# Patient Record
Sex: Female | Born: 1985 | Race: Asian | Hispanic: No | Marital: Married | Smoking: Never smoker
Health system: Southern US, Community
[De-identification: ages and names within clinical notes are randomized; demographics above are authoritative.]

## PROBLEM LIST (undated history)

## (undated) DIAGNOSIS — A150 Tuberculosis of lung: Secondary | ICD-10-CM

## (undated) DIAGNOSIS — Z141 Cystic fibrosis carrier: Secondary | ICD-10-CM

## (undated) HISTORY — DX: Cystic fibrosis carrier: Z14.1

## (undated) HISTORY — DX: Tuberculosis of lung: A15.0

## (undated) NOTE — *Deleted (*Deleted)
Inpatient Rehabilitation Care Coordinator  Discharge Note  The overall goal for the admission was met for:   Discharge location: Yes-HOME WITH HUSBAND AND TWO CHILDREN 9 & 5 MONTHS  Length of Stay: Yes-17 DAYS  Discharge activity level: Yes-SUPERVISION-MOD/I LEVEL  Home/community participation: Yes  Services provided included: MD, RD, PT, OT, RN, CM, Pharmacy, Neuropsych and SW  Financial Services: Medicaid  Follow-up services arranged: DME: [facility], 3 IN1 AND [facility] and Patient/Family has no preference for HH/DME agencies  Comments (or additional information):HUSBAND WAS HERE DAILY AND WAS EDUCATED ON BOWEL AND BLADDER PROGRAMS. AWARE HOME HEALTH UNABLE TO GET DUE TO MEDICAID. PLAN TO EVENTUALLY MOVE TO [place] WHERE FAMILY RESIDES  Patient/Family verbalized understanding of follow-up arrangements: Yes  Individual responsible for coordination of the follow-up plan: [facility] [phone]  Confirmed correct DME delivered: Lucy Chris 12/18/2019    Dupree, Lemar Livings

---

## 2010-01-25 NOTE — L&D Delivery Note (Signed)
Operative Delivery Note At 8:34 PM a viable and healthy female was delivered via Vaginal, Vacuum (Kiwi and Office manager).  Presentation: vertex; Position: Occiput,, Anterior; Station: +2/3  Verbal consent: obtained from patient.  Risks and benefits discussed in detail.  Risks include, but are not limited to the risks of anesthesia, bleeding, infection, damage to maternal tissues, fetal cephalhematoma.  There is also the risk of inability to effect vaginal delivery of the head, or shoulder dystocia that cannot be resolved by established maneuvers, leading to the need for emergency cesarean section.  APGAR: 8, 9; weight 6 lb 3.8 oz (2830 g).   Placenta status: Intact, Spontaneous.   Cord: 3 vessels with the following complications: None.    Anesthesia: Epidural  Instruments: Kiwi and Mityvac Episiotomy: None Lacerations: 2nd degree Suture Repair: 3.0 vicryl rapide Est. Blood Loss (mL): 400  Mom to postpartum.  Baby to nursery-stable.  A+, Bo  BOVARD,JODY 09/24/2010, 9:03 PM

## 2010-04-18 ENCOUNTER — Emergency Department (HOSPITAL_COMMUNITY)
Admission: EM | Admit: 2010-04-18 | Discharge: 2010-04-19 | Disposition: A | Discharge status: Home / Self Care | Payer: Medicaid Other | Attending: Emergency Medicine | Admitting: Emergency Medicine

## 2010-04-18 ENCOUNTER — Emergency Department (HOSPITAL_COMMUNITY): Payer: Medicaid Other

## 2010-04-18 DIAGNOSIS — J3489 Other specified disorders of nose and nasal sinuses: Secondary | ICD-10-CM | POA: Insufficient documentation

## 2010-04-18 DIAGNOSIS — R070 Pain in throat: Secondary | ICD-10-CM | POA: Insufficient documentation

## 2010-04-18 DIAGNOSIS — B9789 Other viral agents as the cause of diseases classified elsewhere: Secondary | ICD-10-CM | POA: Insufficient documentation

## 2010-04-18 DIAGNOSIS — R05 Cough: Secondary | ICD-10-CM | POA: Insufficient documentation

## 2010-04-18 DIAGNOSIS — R059 Cough, unspecified: Secondary | ICD-10-CM | POA: Insufficient documentation

## 2010-04-18 DIAGNOSIS — O9989 Other specified diseases and conditions complicating pregnancy, childbirth and the puerperium: Secondary | ICD-10-CM | POA: Insufficient documentation

## 2010-04-18 DIAGNOSIS — J40 Bronchitis, not specified as acute or chronic: Secondary | ICD-10-CM | POA: Insufficient documentation

## 2010-04-23 LAB — ANTIBODY SCREEN: Antibody Screen: NEGATIVE

## 2010-04-23 LAB — RPR: RPR: NONREACTIVE

## 2010-04-23 LAB — HIV ANTIBODY (ROUTINE TESTING W REFLEX)
HIV: NONREACTIVE
HIV: NONREACTIVE

## 2010-04-23 LAB — GC/CHLAMYDIA PROBE AMP, GENITAL
Chlamydia: NEGATIVE
Chlamydia: NEGATIVE
Gonorrhea: NEGATIVE
Gonorrhea: NEGATIVE

## 2010-09-21 ENCOUNTER — Other Ambulatory Visit: Payer: Self-pay | Admitting: Obstetrics and Gynecology

## 2010-09-23 ENCOUNTER — Encounter (HOSPITAL_COMMUNITY): Payer: Self-pay | Admitting: *Deleted

## 2010-09-23 ENCOUNTER — Encounter (HOSPITAL_COMMUNITY): Payer: Self-pay

## 2010-09-23 ENCOUNTER — Other Ambulatory Visit: Payer: Self-pay | Admitting: Obstetrics and Gynecology

## 2010-09-23 ENCOUNTER — Inpatient Hospital Stay (HOSPITAL_COMMUNITY): Admission: RE | Admit: 2010-09-23 | Payer: Self-pay | Source: Ambulatory Visit

## 2010-09-23 ENCOUNTER — Inpatient Hospital Stay (HOSPITAL_COMMUNITY)
Admission: RE | Admit: 2010-09-23 | Discharge: 2010-09-26 | DRG: 775 | Disposition: A | Discharge status: Home / Self Care | Payer: Medicaid Other | Source: Ambulatory Visit | Attending: Obstetrics and Gynecology | Admitting: Obstetrics and Gynecology

## 2010-09-23 ENCOUNTER — Telehealth (HOSPITAL_COMMUNITY): Payer: Self-pay | Admitting: *Deleted

## 2010-09-23 DIAGNOSIS — O480 Post-term pregnancy: Principal | ICD-10-CM | POA: Diagnosis present

## 2010-09-23 DIAGNOSIS — Z3490 Encounter for supervision of normal pregnancy, unspecified, unspecified trimester: Secondary | ICD-10-CM

## 2010-09-23 LAB — CBC
HCT: 37.7 % (ref 36.0–46.0)
Hemoglobin: 12.4 g/dL (ref 12.0–15.0)
MCV: 85.3 fL (ref 78.0–100.0)
Platelets: 122 10*3/uL — ABNORMAL LOW (ref 150–400)
RBC: 4.42 MIL/uL (ref 3.87–5.11)
WBC: 8.6 10*3/uL (ref 4.0–10.5)

## 2010-09-23 MED ORDER — LACTATED RINGERS IV SOLN
INTRAVENOUS | Status: DC
  Administered 2010-09-24: 12:00:00 via INTRAVENOUS
  Administered 2010-09-24: 1000 mL via INTRAVENOUS

## 2010-09-23 MED ORDER — OXYTOCIN BOLUS FROM INFUSION
500.0000 mL | Freq: Once | INTRAVENOUS | Status: DC
  Filled 2010-09-23: qty 500

## 2010-09-23 MED ORDER — TERBUTALINE SULFATE 1 MG/ML IJ SOLN: 0.2500 mg | Freq: Once | INTRAMUSCULAR | Status: AC | PRN

## 2010-09-23 MED ORDER — ACETAMINOPHEN 325 MG PO TABS: 650.0000 mg | ORAL_TABLET | ORAL | Status: DC | PRN

## 2010-09-23 MED ORDER — DINOPROSTONE 10 MG VA INST
10.0000 mg | VAGINAL_INSERT | Freq: Once | VAGINAL | Status: AC
  Administered 2010-09-23: 10 mg via VAGINAL
  Filled 2010-09-23: qty 1

## 2010-09-23 MED ORDER — ONDANSETRON HCL 4 MG/2ML IJ SOLN: 4.0000 mg | Freq: Four times a day (QID) | INTRAMUSCULAR | Status: DC | PRN

## 2010-09-23 MED ORDER — OXYTOCIN 20 UNITS IN LACTATED RINGERS INFUSION - SIMPLE
1.0000 m[IU]/min | INTRAVENOUS | Status: DC
  Administered 2010-09-24: 333 m[IU]/min via INTRAVENOUS
  Filled 2010-09-23: qty 1000

## 2010-09-23 MED ORDER — SODIUM CHLORIDE 0.9 % IJ SOLN
3.0000 mL | INTRAMUSCULAR | Status: DC | PRN
  Administered 2010-09-23 – 2010-09-24 (×2): 3 mL via INTRAVENOUS

## 2010-09-23 MED ORDER — LIDOCAINE HCL (PF) 1 % IJ SOLN
30.0000 mL | INTRAMUSCULAR | Status: DC | PRN
  Administered 2010-09-24: 30 mL via SUBCUTANEOUS
  Filled 2010-09-23: qty 30

## 2010-09-23 MED ORDER — LACTATED RINGERS IV SOLN
500.0000 mL | INTRAVENOUS | Status: DC | PRN
  Administered 2010-09-24: 500 mL via INTRAVENOUS

## 2010-09-23 MED ORDER — BUTORPHANOL TARTRATE 2 MG/ML IJ SOLN
1.0000 mg | INTRAMUSCULAR | Status: DC | PRN
  Administered 2010-09-24 (×2): 1 mg via INTRAVENOUS
  Filled 2010-09-23 (×2): qty 1

## 2010-09-23 MED ORDER — FLEET ENEMA 7-19 GM/118ML RE ENEM: 1.0000 | ENEMA | RECTAL | Status: DC | PRN

## 2010-09-23 MED ORDER — CITRIC ACID-SODIUM CITRATE 334-500 MG/5ML PO SOLN: 30.0000 mL | ORAL | Status: DC | PRN

## 2010-09-23 MED ORDER — ZOLPIDEM TARTRATE 10 MG PO TABS: 10.0000 mg | ORAL_TABLET | Freq: Every evening | ORAL | Status: DC | PRN

## 2010-09-23 MED ORDER — IBUPROFEN 600 MG PO TABS: 600.0000 mg | ORAL_TABLET | Freq: Four times a day (QID) | ORAL | Status: DC | PRN

## 2010-09-23 MED ORDER — OXYTOCIN 20 UNITS IN LACTATED RINGERS INFUSION - SIMPLE: 125.0000 mL/h | INTRAVENOUS | Status: DC

## 2010-09-23 MED ORDER — OXYCODONE-ACETAMINOPHEN 5-325 MG PO TABS: 2.0000 | ORAL_TABLET | ORAL | Status: DC | PRN

## 2010-09-23 NOTE — H&P (Signed)
Melanie Baldwin is an 25 y.o. female. G1P0 at 41+ for iol given term status and unfavorable cervix.  +FM, no LOF, sm VB, occ ctx; uncomplicated [facility]  Pertinent Gynecological History: No abn pap, no STD OB History: G1, P0   Menstrual History:  No LMP recorded. Patient is pregnant. EDC 09/16/10, nl anat, ant plac, female   Past Medical History  Diagnosis Date  . TB (pulmonary tuberculosis)     took med for 6 months currently negative  . Cystic fibrosis carrier     No past surgical history on file.  Family History  Problem Relation Age of Onset  . Diabetes Paternal Grandmother     Social History:  does not have a smoking history on file. She does not have any smokeless tobacco history on file. Her alcohol and drug histories not on file.  Allergies: Allergies no known allergies   (Not in a hospital admission)  Review of Systems  Constitutional: Negative.   HENT: Negative.   Eyes: Negative.   Respiratory: Negative.   Cardiovascular: Negative.   Gastrointestinal: Negative.   Genitourinary: Negative.   Musculoskeletal: Negative.   Skin: Negative.   Neurological: Negative.   Endo/Heme/Allergies: Negative.   Psychiatric/Behavioral: Negative.     There were no vitals taken for this visit. Physical Exam  Constitutional: She is oriented to person, place, and time. She appears well-developed and well-nourished.  HENT:  Head: Normocephalic and atraumatic.  Neck: Normal range of motion. Neck supple. No thyromegaly present.  Cardiovascular: Normal rate and regular rhythm.   Respiratory: Effort normal and breath sounds normal.  GI: Soft. Bowel sounds are normal.       FNT  Musculoskeletal: Normal range of motion.  Neurological: She is alert and oriented to person, place, and time.  Skin: Skin is warm and dry.  Psychiatric: She has a normal mood and affect.    PNL Rubella Immune, RPR NR, HepBsag neg, HIV neg, GC neg, Chl neg, Varicella Immune, CF carrier, gbbs neg, AFP WNL,  glucola 81, A+, Ab Scr neg, Hgb 11.5, Pap wnl, no ECC, Rub  Assessment/Plan: 25yo G1P0 at 41+ for iol gbbs neg, no prophylaxis iol with cervidil, AROM, pitocin Epidural/ IV pain med prn Expect SVD - reviewed with pt long iol  BOVARD,JODY 09/23/2010, 4:31 PM

## 2010-09-23 NOTE — Telephone Encounter (Signed)
Preadmission screen  

## 2010-09-24 ENCOUNTER — Encounter (HOSPITAL_COMMUNITY): Payer: Self-pay | Admitting: Anesthesiology

## 2010-09-24 ENCOUNTER — Inpatient Hospital Stay (HOSPITAL_COMMUNITY): Payer: Medicaid Other | Admitting: Anesthesiology

## 2010-09-24 ENCOUNTER — Encounter (HOSPITAL_COMMUNITY): Payer: Self-pay

## 2010-09-24 DIAGNOSIS — Z3490 Encounter for supervision of normal pregnancy, unspecified, unspecified trimester: Secondary | ICD-10-CM

## 2010-09-24 LAB — CBC
HCT: 36.5 % (ref 36.0–46.0)
Hemoglobin: 11.7 g/dL — ABNORMAL LOW (ref 12.0–15.0)
RDW: 14.9 % (ref 11.5–15.5)
WBC: 16.4 10*3/uL — ABNORMAL HIGH (ref 4.0–10.5)

## 2010-09-24 MED ORDER — EPHEDRINE 5 MG/ML INJ
10.0000 mg | INTRAVENOUS | Status: DC | PRN
  Filled 2010-09-24: qty 4

## 2010-09-24 MED ORDER — TERBUTALINE SULFATE 1 MG/ML IJ SOLN: 0.2500 mg | Freq: Once | INTRAMUSCULAR | Status: DC | PRN

## 2010-09-24 MED ORDER — PRENATAL PLUS 27-1 MG PO TABS
1.0000 | ORAL_TABLET | Freq: Every day | ORAL | Status: DC
  Administered 2010-09-26: 1 via ORAL

## 2010-09-24 MED ORDER — OXYTOCIN 20 UNITS IN LACTATED RINGERS INFUSION - SIMPLE
1.0000 m[IU]/min | INTRAVENOUS | Status: DC
  Administered 2010-09-24: 2 m[IU]/min via INTRAVENOUS
  Filled 2010-09-24: qty 1000

## 2010-09-24 MED ORDER — FENTANYL 2.5 MCG/ML BUPIVACAINE 1/10 % EPIDURAL INFUSION (WH - ANES)
INTRAMUSCULAR | Status: AC
  Filled 2010-09-24: qty 60

## 2010-09-24 MED ORDER — WITCH HAZEL-GLYCERIN EX PADS: 1.0000 "application " | MEDICATED_PAD | CUTANEOUS | Status: DC | PRN

## 2010-09-24 MED ORDER — SODIUM CHLORIDE 0.9 % IV SOLN: 250.0000 mL | INTRAVENOUS | Status: DC

## 2010-09-24 MED ORDER — BENZOCAINE-MENTHOL 20-0.5 % EX AERO: 1.0000 "application " | INHALATION_SPRAY | CUTANEOUS | Status: DC | PRN

## 2010-09-24 MED ORDER — PHENYLEPHRINE 40 MCG/ML (10ML) SYRINGE FOR IV PUSH (FOR BLOOD PRESSURE SUPPORT)
80.0000 ug | PREFILLED_SYRINGE | INTRAVENOUS | Status: DC | PRN
  Filled 2010-09-24: qty 5

## 2010-09-24 MED ORDER — TETANUS-DIPHTH-ACELL PERTUSSIS 5-2.5-18.5 LF-MCG/0.5 IM SUSP
0.5000 mL | Freq: Once | INTRAMUSCULAR | Status: AC
  Administered 2010-09-25: 0.5 mL via INTRAMUSCULAR

## 2010-09-24 MED ORDER — SIMETHICONE 80 MG PO CHEW: 80.0000 mg | CHEWABLE_TABLET | ORAL | Status: DC | PRN

## 2010-09-24 MED ORDER — SODIUM CHLORIDE 0.9 % IJ SOLN: 3.0000 mL | INTRAMUSCULAR | Status: DC | PRN

## 2010-09-24 MED ORDER — ONDANSETRON HCL 4 MG/2ML IJ SOLN: 4.0000 mg | INTRAMUSCULAR | Status: DC | PRN

## 2010-09-24 MED ORDER — OXYTOCIN 20 UNITS IN LACTATED RINGERS INFUSION - SIMPLE: 125.0000 mL/h | INTRAVENOUS | Status: DC | PRN

## 2010-09-24 MED ORDER — EPHEDRINE 5 MG/ML INJ
INTRAVENOUS | Status: AC
  Filled 2010-09-24: qty 4

## 2010-09-24 MED ORDER — OXYCODONE-ACETAMINOPHEN 5-325 MG PO TABS: 1.0000 | ORAL_TABLET | ORAL | Status: DC | PRN

## 2010-09-24 MED ORDER — IBUPROFEN 600 MG PO TABS
600.0000 mg | ORAL_TABLET | Freq: Four times a day (QID) | ORAL | Status: DC
  Administered 2010-09-25 – 2010-09-26 (×7): 600 mg via ORAL
  Filled 2010-09-24 (×7): qty 1

## 2010-09-24 MED ORDER — PHENYLEPHRINE 40 MCG/ML (10ML) SYRINGE FOR IV PUSH (FOR BLOOD PRESSURE SUPPORT)
PREFILLED_SYRINGE | INTRAVENOUS | Status: AC
  Filled 2010-09-24: qty 5

## 2010-09-24 MED ORDER — LACTATED RINGERS IV SOLN: INTRAVENOUS | Status: DC

## 2010-09-24 MED ORDER — PRENATAL PLUS 27-1 MG PO TABS
1.0000 | ORAL_TABLET | Freq: Every day | ORAL | Status: DC
  Administered 2010-09-25 – 2010-09-26 (×2): 1 via ORAL
  Filled 2010-09-24 (×2): qty 1

## 2010-09-24 MED ORDER — ONDANSETRON HCL 4 MG PO TABS: 4.0000 mg | ORAL_TABLET | ORAL | Status: DC | PRN

## 2010-09-24 MED ORDER — DIPHENHYDRAMINE HCL 50 MG/ML IJ SOLN: 12.5000 mg | INTRAMUSCULAR | Status: DC | PRN

## 2010-09-24 MED ORDER — LANOLIN HYDROUS EX OINT: TOPICAL_OINTMENT | CUTANEOUS | Status: DC | PRN

## 2010-09-24 MED ORDER — ZOLPIDEM TARTRATE 5 MG PO TABS: 5.0000 mg | ORAL_TABLET | Freq: Every evening | ORAL | Status: DC | PRN

## 2010-09-24 MED ORDER — DIPHENHYDRAMINE HCL 25 MG PO CAPS: 25.0000 mg | ORAL_CAPSULE | Freq: Four times a day (QID) | ORAL | Status: DC | PRN

## 2010-09-24 MED ORDER — SODIUM CHLORIDE 0.9 % IJ SOLN
3.0000 mL | Freq: Two times a day (BID) | INTRAMUSCULAR | Status: DC
  Administered 2010-09-25: 3 mL via INTRAVENOUS

## 2010-09-24 MED ORDER — SENNOSIDES-DOCUSATE SODIUM 8.6-50 MG PO TABS
2.0000 | ORAL_TABLET | Freq: Every day | ORAL | Status: DC
  Administered 2010-09-25: 2 via ORAL

## 2010-09-24 MED ORDER — FENTANYL 2.5 MCG/ML BUPIVACAINE 1/10 % EPIDURAL INFUSION (WH - ANES)
14.0000 mL/h | INTRAMUSCULAR | Status: DC
  Administered 2010-09-24 (×2): 14 mL/h via EPIDURAL
  Administered 2010-09-24: 12 mL/h via EPIDURAL
  Filled 2010-09-24 (×2): qty 60

## 2010-09-24 MED ORDER — LACTATED RINGERS IV SOLN: 500.0000 mL | Freq: Once | INTRAVENOUS | Status: DC

## 2010-09-24 MED ORDER — DIBUCAINE 1 % RE OINT: 1.0000 "application " | TOPICAL_OINTMENT | RECTAL | Status: DC | PRN

## 2010-09-24 NOTE — Progress Notes (Signed)
Pt denies pain medication at this time

## 2010-09-24 NOTE — Anesthesia Procedure Notes (Signed)
Epidural Patient location during procedure: OB Start time: 09/24/2010 9:55 AM  Staffing Anesthesiologist: Jiles Garter  Preanesthetic Checklist Completed: patient identified, site marked, surgical consent, pre-op evaluation, timeout performed, IV checked, risks and benefits discussed and monitors and equipment checked  Epidural Patient position: sitting Prep: site prepped and draped and DuraPrep Patient monitoring: continuous pulse ox and blood pressure Approach: midline Injection technique: LOR air  Needle:  Needle type: Tuohy  Needle gauge: 17 G Needle length: 9 cm Needle insertion depth: 4 cm Catheter type: closed end flexible Catheter size: 19 Gauge Catheter at skin depth: 10 cm Test dose: negative  Assessment Events: blood not aspirated, injection not painful, no injection resistance, negative IV test and no paresthesia  Additional Notes Dosing of Epidural: 1st dose, Through needle...... 3mg  Marcaine 2nd dose, through catheter.... epi 1:200K + Xylocaine 40 mg 3rd dose, through catheter...Marland KitchenMarland Kitchenepi 1:200K + Xylocaine 30 mg Each dose occurred after waiting 3 min,patient was free of IV sx; and patient exhibits no evidence of SA injection  Patient is more comfortable after epidural dosed. Please see RN's note for documentation of vital signs,and FHR which are stable.

## 2010-09-24 NOTE — Anesthesia Preprocedure Evaluation (Addendum)

## 2010-09-24 NOTE — Progress Notes (Signed)
Melanie Baldwin is a 25 y.o. G1P0000 at [redacted]w[redacted]d  admitted for induction of labor due to Post dates. .  Subjective: No c/o's, uncomf with ctx  Objective: BP 131/94  Pulse 88  Temp(Src) 98.2 F (36.8 C) (Oral)  Resp 20  Ht 5\' 3"  (1.6 m)  Wt 70.308 kg (155 lb)  BMI 27.46 kg/m2     gen NAD FHT:  FHR: 120's bpm, variability: moderate,  decelerations:  Absent UC:   regular, every 2 minutes SVE:   Dilation: 2.3 Effacement (%): 80 Station: -1/0 Exam by:: Carmelina Noun, MD  Labs: Lab Results  Component Value Date   WBC 8.6 09/23/2010   HGB 12.4 09/23/2010   HCT 37.7 09/23/2010   MCV 85.3 09/23/2010   PLT 122* 09/23/2010    Assessment / Plan: Induction of labor due to postterm,  progressing well on pitocin, will start pitocin  Labor: AROM performed, sm clear fluid, cervidil removed Preeclampsia:  no signs or symptoms of toxicity Fetal Wellbeing:  Category II Pain Control:  Epiduralor IV pain meds prn  Anticipated MOD:  NSVD  BOVARD,JODY 09/24/2010, 8:05 AM

## 2010-09-24 NOTE — Progress Notes (Signed)
Yuko Coventry is a 25 y.o. G1P0000 at [redacted]w[redacted]d admitted for induction of labor due to Post dates.    Subjective: comf with epidural  Objective: BP 122/81  Pulse 90  Temp(Src) 97.9 F (36.6 C) (Oral)  Resp 20  Ht 5\' 3"  (1.6 m)  Wt 70.308 kg (155 lb)  BMI 27.46 kg/m2  SpO2 99%     gen NAD FHT:  FHR: 130's bpm, variability: moderate,  accelerations:  Present,  decelerations:  Absent UC:   irregular, every 5-7 minutes SVE:   Dilation: 7 Effacement (%): 90 Station: 0/+1 Exam by:: Carmelina Noun, MD  Labs: Lab Results  Component Value Date   WBC 8.6 09/23/2010   HGB 12.4 09/23/2010   HCT 37.7 09/23/2010   MCV 85.3 09/23/2010   PLT 122* 09/23/2010    Assessment / Plan: Induction of labor due to postterm.  Labor: pitocin to augment labor Preeclampsia:  no signs or symptoms of toxicity Fetal Wellbeing:  Category II Pain Control:  Epidural Anticipated MOD:  NSVD  BOVARD,JODY 09/24/2010, 4:34 PM

## 2010-09-24 NOTE — Progress Notes (Signed)
Melanie Baldwin is a 25 y.o. G1P0000 at [redacted]w[redacted]d  admitted for induction of labor due to Post dates.   Subjective: comf with epidural  Objective: BP 106/62  Pulse 81  Temp(Src) 97.6 F (36.4 C) (Oral)  Resp 20  Ht 5\' 3"  (1.6 m)  Wt 70.308 kg (155 lb)  BMI 27.46 kg/m2  SpO2 99%     gen NAD FHT:  FHR: 130's bpm, variability: moderate,  accelerations:  Present,  decelerations:  Absent UC:   regular, every 5 minutes SVE:   Dilation: 4.5 Effacement (%): 80 Station: -1/0 Exam by:: Carmelina Noun, MD  Labs: Lab Results  Component Value Date   WBC 8.6 09/23/2010   HGB 12.4 09/23/2010   HCT 37.7 09/23/2010   MCV 85.3 09/23/2010   PLT 122* 09/23/2010    Assessment / Plan: Induction of labor due to postterm,  progressing well on pitocin  Labor: Progressing normally Preeclampsia:  no signs or symptoms of toxicity Fetal Wellbeing:  Category II Pain Control:  Epidural  Anticipated MOD:  NSVD  BOVARD,Manessa Buley 09/24/2010, 12:27 PM

## 2010-09-25 ENCOUNTER — Encounter (HOSPITAL_COMMUNITY): Payer: Self-pay

## 2010-09-25 LAB — CBC
Hemoglobin: 11.1 g/dL — ABNORMAL LOW (ref 12.0–15.0)
MCH: 27.9 pg (ref 26.0–34.0)
RBC: 3.98 MIL/uL (ref 3.87–5.11)
WBC: 14.9 10*3/uL — ABNORMAL HIGH (ref 4.0–10.5)

## 2010-09-25 MED ORDER — BENZOCAINE-MENTHOL 20-0.5 % EX AERO
INHALATION_SPRAY | CUTANEOUS | Status: AC
  Filled 2010-09-25: qty 56

## 2010-09-25 NOTE — Anesthesia Postprocedure Evaluation (Signed)
  Anesthesia Post-op Note  Patient: Melanie Baldwin  Procedure(s) Performed: * No procedures listed *  Patient Location: Mother/Baby  Anesthesia Type: Epidural  Level of Consciousness: awake, alert  and oriented  Airway and Oxygen Therapy: Patient Spontanous Breathing   Post-op Assessment: Post-op Vital signs reviewed, Patient's Cardiovascular Status Stable, No headache, No backache, No residual numbness and No residual motor weakness  Post-op Vital Signs: Reviewed and stable  Complications: No apparent anesthesia complications

## 2010-09-25 NOTE — Progress Notes (Signed)
Post Partum Day 1 Subjective: no complaints, tolerating PO and pain controlled, nl lochia  Objective: Blood pressure 104/70, pulse 108, temperature 98.1 F (36.7 C), temperature source Oral, resp. rate 20, height 5\' 3"  (1.6 m), weight 70.308 kg (155 lb), last menstrual period 01/04/2010, SpO2 99.00%, unknown if currently breastfeeding.  Physical Exam:  General: alert and no distress Lochia: appropriate Uterine Fundus: firm  DVT Evaluation: No evidence of DVT seen on physical exam.   Basename 09/25/10 0545 09/24/10 2245  HGB 11.1* 11.7*  HCT 34.2* 36.5    Assessment/Plan: Plan for discharge tomorrow, bottlefeeding.  Doing well   LOS: 2 days   Baldwin,Melanie Vanderveer 09/25/2010, 7:59 AM

## 2010-09-25 NOTE — Progress Notes (Signed)
UR chart review completed.  

## 2010-09-26 MED ORDER — OXYCODONE-ACETAMINOPHEN 5-325 MG PO TABS: 1.0000 | ORAL_TABLET | ORAL | Status: AC | PRN

## 2010-09-26 MED ORDER — IBUPROFEN 600 MG PO TABS: 600.0000 mg | ORAL_TABLET | Freq: Four times a day (QID) | ORAL | Status: AC

## 2010-09-26 MED ORDER — SENNOSIDES-DOCUSATE SODIUM 8.6-50 MG PO TABS: 2.0000 | ORAL_TABLET | Freq: Every day | ORAL | Status: AC

## 2010-09-26 NOTE — Progress Notes (Signed)
Post Partum Day1 Subjective: no complaints, voiding and tolerating PO  Objective: Blood pressure 101/70, pulse 89, temperature 97.5 F (36.4 C), temperature source Oral, resp. rate 18, height 5\' 3"  (1.6 m), weight 70.308 kg (155 lb), last menstrual period 01/04/2010, SpO2 99.00%, unknown if currently breastfeeding.  Physical Exam:  General: alert Lochia: appropriate Uterine Fundus: firm  Basename 09/25/10 0545 09/24/10 2245  HGB 11.1* 11.7*  HCT 34.2* 36.5    Assessment/Plan: Discharge home Motrin and percocet Baby being tested for jaundice.   LOS: 3 days   RICHARDSON,KATHY W 09/26/2010, 9:58 AM

## 2010-09-26 NOTE — Discharge Summary (Signed)
Obstetric Discharge Summary Reason for Admission: induction of labor Prenatal Procedures: cervidil ripening Intrapartum Procedures: vacuum Postpartum Procedures: none Complications-Operative and Postpartum: 2nd degree perineal laceration Hemoglobin  Date Value Range Status  09/25/2010 11.1* 12.0-15.0 (g/dL) Final     HCT  Date Value Range Status  09/25/2010 34.2* 36.0-46.0 (%) Final    Discharge Diagnoses: Term Pregnancy-delivered                                         Vacuum assisted Vaginal delivery  Discharge Information: Date: 09/26/2010 Activity: pelvic rest Diet: routine Medications: Ibuprophen and Percocet  Condition: improved Instructions: refer to practice specific booklet Discharge to: home Follow-up Information    Follow up with BOVARD,JODY, MD. Make an appointment in 6 weeks.   Contact information:   [address] [place] [address] [phone]          Newborn Data: Live born female  Birth Weight: 6 lb 3.8 oz (2830 g) APGAR: 8, 9  Home with mother.  Oliver Pila 09/26/2010, 10:04 AM

## 2010-09-27 ENCOUNTER — Encounter (HOSPITAL_COMMUNITY): Payer: Self-pay

## 2010-09-27 ENCOUNTER — Inpatient Hospital Stay (HOSPITAL_COMMUNITY)
Admission: AD | Admit: 2010-09-27 | Discharge: 2010-09-27 | Disposition: A | Discharge status: Home / Self Care | Payer: Medicaid Other | Source: Ambulatory Visit | Attending: Obstetrics and Gynecology | Admitting: Obstetrics and Gynecology

## 2010-09-27 ENCOUNTER — Emergency Department (HOSPITAL_COMMUNITY): Payer: Medicaid Other

## 2010-09-27 ENCOUNTER — Emergency Department (HOSPITAL_COMMUNITY)
Admission: EM | Admit: 2010-09-27 | Discharge: 2010-09-27 | Disposition: A | Discharge status: Other Acute Inpatient Hospital | Payer: Medicaid Other | Source: Home / Self Care | Attending: Emergency Medicine | Admitting: Emergency Medicine

## 2010-09-27 DIAGNOSIS — O26899 Other specified pregnancy related conditions, unspecified trimester: Secondary | ICD-10-CM | POA: Insufficient documentation

## 2010-09-27 DIAGNOSIS — R10819 Abdominal tenderness, unspecified site: Secondary | ICD-10-CM | POA: Insufficient documentation

## 2010-09-27 DIAGNOSIS — R109 Unspecified abdominal pain: Secondary | ICD-10-CM | POA: Insufficient documentation

## 2010-09-27 DIAGNOSIS — N949 Unspecified condition associated with female genital organs and menstrual cycle: Secondary | ICD-10-CM | POA: Insufficient documentation

## 2010-09-27 DIAGNOSIS — N898 Other specified noninflammatory disorders of vagina: Secondary | ICD-10-CM | POA: Insufficient documentation

## 2010-09-27 LAB — CBC
HCT: 30.8 % — ABNORMAL LOW (ref 36.0–46.0)
Hemoglobin: 10.2 g/dL — ABNORMAL LOW (ref 12.0–15.0)
MCH: 27.9 pg (ref 26.0–34.0)
MCHC: 33.1 g/dL (ref 30.0–36.0)
MCV: 84.4 fL (ref 78.0–100.0)
Platelets: 137 K/uL — ABNORMAL LOW (ref 150–400)
RBC: 3.65 MIL/uL — ABNORMAL LOW (ref 3.87–5.11)
RDW: 15 % (ref 11.5–15.5)
WBC: 10.5 K/uL (ref 4.0–10.5)

## 2010-09-27 LAB — DIFFERENTIAL
Eosinophils Absolute: 0.2 10*3/uL (ref 0.0–0.7)
Lymphocytes Relative: 11 % — ABNORMAL LOW (ref 12–46)
Lymphs Abs: 1.1 10*3/uL (ref 0.7–4.0)
Monocytes Relative: 8 % (ref 3–12)
Neutrophils Relative %: 79 % — ABNORMAL HIGH (ref 43–77)

## 2010-09-27 LAB — BASIC METABOLIC PANEL
CO2: 24 mEq/L (ref 19–32)
Calcium: 8.4 mg/dL (ref 8.4–10.5)
GFR calc non Af Amer: >60 mL/min (ref >60)
Sodium: 135 mEq/L (ref 135–145)

## 2010-09-27 LAB — WET PREP, GENITAL
Trich, Wet Prep: NONE SEEN
Yeast Wet Prep HPF POC: NONE SEEN

## 2010-09-27 MED ORDER — AMOXICILLIN-POT CLAVULANATE 875-125 MG PO TABS: 1.0000 | ORAL_TABLET | Freq: Two times a day (BID) | ORAL | Status: AC

## 2010-09-27 NOTE — Progress Notes (Signed)
Patient is transferred from [place] by [facility]. She had svd on 8/30 and was discharged yesterday. Patient states that she was having severe lower abdominal pain and was told to go to [facility]. Patient was given iv antibiotic doxycyline at [place]. She denies any pain,nausea or vomiting. she is alert and oriented.

## 2010-09-27 NOTE — ED Provider Notes (Signed)
Client stable.  Dr. Senaida Ores has been notified and will come to see client.  Nolene Bernheim, NP 09/27/10 0717  Pt transferred from [facility] ED where she mistakenly went with pain and possible fever postpartum.  Pt [facility] and went home.  Never got her pain prescriptions filled and never took any further meds.   About 9pm had an onset of abdominal/pelvic pain and got very hot and sweaty--family thought she had fever, but no thermometer at home.  Baby doing well. Pt is bottle feeding.  Breasts are slightly engorged but not painful.  Pt at [facility] ED since 12am and received  Morphine 6mg  IV at 2am as well as was started on Doxycycline and Flagyl before I was called.  WBC was normal at 10K and pt has been afebrile her entire hospital visit for 8 hours. Also had abdominal US which was c/w her newly pp state. Pt reports bleeding was minimal at home.  PE  Mildly engorged breasts Abdomen soft, uterus approp at umbilicus--slightly tender to palpation Ext clear Pelvic not repeated as was done at [facility]  Pt with pelvic pain and possible fever at home.  D/w her importance of getting pain meds filled and at a minimum using the motrin for uterine crampimng.  Pt did have a long labor course so could have developed a mild endometritis. Has received doxy/flagyl per [place].  Will d/c home on Augmentin 875mg  po BID for 5 days to cover that.  She and translator were given careful instructions to call or return to [facility] for temp greater than 101 or return of pain that is not controlled with po meds.  Will f/u in office for pp visit or prn.

## 2010-09-27 NOTE — Progress Notes (Signed)
Dr Senaida Ores notified of patient arrival. She was aware of patient from [place]. She states that she will come to see patient within an hour. She orders temperature to be rechecked.

## 2013-11-26 ENCOUNTER — Encounter (HOSPITAL_COMMUNITY): Payer: Self-pay

## 2014-06-10 ENCOUNTER — Ambulatory Visit (INDEPENDENT_AMBULATORY_CARE_PROVIDER_SITE_OTHER): Payer: 59 | Admitting: Family Medicine

## 2014-06-10 ENCOUNTER — Encounter: Payer: Self-pay | Admitting: Family Medicine

## 2014-06-10 VITALS — BP 90/68 | HR 84 | Wt 153.2 lb

## 2014-06-10 DIAGNOSIS — M5442 Lumbago with sciatica, left side: Secondary | ICD-10-CM

## 2014-06-10 NOTE — Progress Notes (Signed)
   Subjective:    Patient ID: Melanie Baldwin, female    DOB: 1985/08/29, 29 y.o.   MRN: [id number]  HPI She complains of a one-day history of low back pain. This started when she was brushing her teeth yesterday morning. No numbness, tingling or weakness but it does cause difficulty with walking or with any motion.   Review of Systems     Objective:   Physical Exam Pain on motion of her back. Some tenderness over the left SI joint. Straight leg raising was positive at 45 with positive sciatic stretch. DTRs normal. Hip motion. Faber testing negative.       Assessment & Plan:  Left-sided low back pain with left-sided sciatica Recommend 800 mg ibuprofen 3 times a day as well as using Tylenol. Heat for 20 minutes. Return here in one week for a recheck.

## 2014-06-10 NOTE — Patient Instructions (Signed)
4 Advil 3 times per day and set up for an appointment in a week and that's look again. Heat to that area for 20 minutes . Can also take Tylenol with the Advil

## 2014-06-11 ENCOUNTER — Telehealth: Payer: Self-pay | Admitting: Internal Medicine

## 2014-06-11 MED ORDER — DICLOFENAC SODIUM 75 MG PO TBEC: 75.0000 mg | DELAYED_RELEASE_TABLET | Freq: Two times a day (BID) | ORAL | Status: DC

## 2014-06-11 NOTE — Telephone Encounter (Signed)
Called, Spoke with pt. to let her know that Dr. Susann GivensLalonde called in a new Rx for her today

## 2014-06-11 NOTE — Telephone Encounter (Signed)
Pt called and states that you told her to take tynelol and advil and its not helping. What does she need to do. Her pharmacy is [facility]

## 2014-06-11 NOTE — Telephone Encounter (Signed)
Let her know that I called the medication in for her 

## 2014-06-12 ENCOUNTER — Telehealth: Payer: Self-pay | Admitting: Internal Medicine

## 2014-06-12 MED ORDER — DICLOFENAC SODIUM 75 MG PO TBEC: 75.0000 mg | DELAYED_RELEASE_TABLET | Freq: Two times a day (BID) | ORAL | Status: DC

## 2014-06-12 NOTE — Telephone Encounter (Signed)
Pt called in to answering service to let us know, her med was not at pharmacy. I have called in med to pharmacy

## 2015-01-08 ENCOUNTER — Ambulatory Visit (INDEPENDENT_AMBULATORY_CARE_PROVIDER_SITE_OTHER): Payer: 59 | Admitting: Family Medicine

## 2015-01-08 ENCOUNTER — Encounter: Payer: Self-pay | Admitting: Family Medicine

## 2015-01-08 VITALS — BP 110/70 | HR 64 | Temp 99.8°F | Wt 152.8 lb

## 2015-01-08 DIAGNOSIS — R220 Localized swelling, mass and lump, head: Secondary | ICD-10-CM | POA: Diagnosis not present

## 2015-01-08 DIAGNOSIS — M2669 Other specified disorders of temporomandibular joint: Secondary | ICD-10-CM

## 2015-01-08 LAB — CBC WITH DIFFERENTIAL/PLATELET
BASOS ABS: 0 10*3/uL (ref 0.0–0.1)
BASOS PCT: 0 % (ref 0–1)
Eosinophils Absolute: 0 10*3/uL (ref 0.0–0.7)
Eosinophils Relative: 0 % (ref 0–5)
HEMATOCRIT: 38.7 % (ref 36.0–46.0)
HEMOGLOBIN: 12.4 g/dL (ref 12.0–15.0)
LYMPHS PCT: 12 % (ref 12–46)
Lymphs Abs: 1.5 10*3/uL (ref 0.7–4.0)
MCH: 26.5 pg (ref 26.0–34.0)
MCHC: 32 g/dL (ref 30.0–36.0)
MCV: 82.7 fL (ref 78.0–100.0)
MONO ABS: 0.7 10*3/uL (ref 0.1–1.0)
MPV: 11.9 fL (ref 8.6–12.4)
Monocytes Relative: 6 % (ref 3–12)
NEUTROS ABS: 10.1 10*3/uL — AB (ref 1.7–7.7)
NEUTROS PCT: 82 % — AB (ref 43–77)
Platelets: 190 10*3/uL (ref 150–400)
RBC: 4.68 MIL/uL (ref 3.87–5.11)
RDW: 14.5 % (ref 11.5–15.5)
WBC: 12.3 10*3/uL — AB (ref 4.0–10.5)

## 2015-01-08 MED ORDER — HYDROCODONE-ACETAMINOPHEN 5-325 MG PO TABS: 1.0000 | ORAL_TABLET | Freq: Four times a day (QID) | ORAL | Status: DC | PRN

## 2015-01-08 MED ORDER — AMOXICILLIN-POT CLAVULANATE 875-125 MG PO TABS: 1.0000 | ORAL_TABLET | Freq: Two times a day (BID) | ORAL | Status: DC

## 2015-01-08 MED ORDER — CEFTRIAXONE SODIUM 1 G IJ SOLR
1.0000 g | Freq: Once | INTRAMUSCULAR | Status: AC
  Administered 2015-01-08: 1 g via INTRAMUSCULAR

## 2015-01-08 NOTE — Progress Notes (Signed)
   Subjective:    Patient ID: Melanie Baldwin, female    DOB: 1985/03/19, 29 y.o.   MRN: [id number]  HPI Chief Complaint  Patient presents with  . mouth    pain in and out side of mouth. outside face swelling. tried otc meds and no relief. trouble eating   She is here with a 2 day history of left cheek and jaw pain and swelling that worsened today. Denies injury or recent dental work. Denies history of autoimmune disorder. Denies history of dental caries or abscesses.  Reports taking Tylenol and Anbesol without relief. Denies fever, chills, headache, body aches, difficulty swallowing. She moved here from [place] in 2011 and immunizations are unknown.   Reviewed allergies, medications, past medical and social history.   Review of Systems Pertinent positives and negatives in the history of present illness.    Objective:   Physical Exam  Constitutional: She appears well-developed and well-nourished. She has a sickly appearance.  HENT:  Head:    Right Ear: Tympanic membrane, external ear and ear canal normal.  Left Ear: Tympanic membrane, external ear and ear canal normal.  Nose: Nose normal. Right sinus exhibits no maxillary sinus tenderness and no frontal sinus tenderness. Left sinus exhibits no maxillary sinus tenderness and no frontal sinus tenderness.  Mouth/Throat: Uvula is midline, oropharynx is clear and moist and mucous membranes are normal. No oral lesions. Normal dentition. No dental abscesses, uvula swelling or dental caries. No oropharyngeal exudate, posterior oropharyngeal edema, posterior oropharyngeal erythema or tonsillar abscesses.  Marked edema and tenderness to left cheek without erythema or warmth. No induration, fluctuance or drainage. Teeth and gums are normal in appearance and nontender.   Neck: Trachea normal, normal range of motion and full passive range of motion without pain. Neck supple. No tracheal tenderness present. No tracheal deviation present.    Pulmonary/Chest: No stridor.  Lymphadenopathy:       Head (right side): No submandibular, no tonsillar, no preauricular, no posterior auricular and no occipital adenopathy present.       Head (left side): No submandibular, no tonsillar, no preauricular, no posterior auricular and no occipital adenopathy present.    She has no cervical adenopathy.    She has no axillary adenopathy.       Right: No supraclavicular adenopathy present.       Left: No supraclavicular adenopathy present.  Left anterior cervical nodes tender without swelling  Neurological: She is alert.  Skin: Skin is warm and dry. No rash noted. No pallor.   BP 110/70 mmHg  Pulse 64  Temp(Src) 99.8 F (37.7 C) (Oral)  Wt 152 lb 12.8 oz (69.31 kg)  Breastfeeding? No       Assessment & Plan:  Left facial swelling - Plan: Mumps antibody, IgG, Mumps antibody, IgM, ANA, CBC with Differential/Platelet, Sedimentation rate, amoxicillin-clavulanate (AUGMENTIN) 875-125 MG tablet, HYDROcodone-acetaminophen (NORCO) 5-325 MG tablet, cefTRIAXone (ROCEPHIN) injection 1 g  Shane and Dr. Lynelle DoctorKnapp also examined this patient. List of differentials include and likely in favor of Sialadenitis, blocked salivary gland. Possible early abscess however no evidence of that today, and less likely mumps since she is from [place] and we do not have documentation of all of her immunizations. Rocephin 1 g IM given in office, patient was observed and no adverse reaction. Augmentin sent to pharmacy with instructions to start tomorrow. Recommend that patient stay well-hydrated, take prescribed pain medication as needed, and try sucking on lemon drops. Educated patient on when to seek medical care.

## 2015-01-08 NOTE — Patient Instructions (Signed)
You received a shot of Rocephin 1 gram (antibiotic) while in the office. Start taking the augmentin antibiotic tomorrow. Stay well hydrated and you may benefit from sucking on lemon drops. We suspect that you may have a blocked salivary gland and if this is the case then you will hopefully notice improvement soon.  If you get worse by developing fever, chills, body aches or if the swelling if on both sides, then you would need to get medical attention. We are checking some blood work today and will let you know the results.

## 2015-01-09 LAB — ANA: ANA: NEGATIVE

## 2015-01-09 LAB — MUMPS ANTIBODY, IGG: Mumps IgG: 197 AU/mL — ABNORMAL HIGH (ref <9.00)

## 2015-01-09 LAB — SEDIMENTATION RATE: SED RATE: 18 mm/h (ref 0–20)

## 2015-01-11 LAB — MUMPS ANTIBODY, IGM: Mumps IgM Value: <1:20 {titer}

## 2018-10-16 LAB — OB RESULTS CONSOLE RPR: RPR: NONREACTIVE

## 2018-10-16 LAB — OB RESULTS CONSOLE GC/CHLAMYDIA
Chlamydia: NEGATIVE
Gonorrhea: NEGATIVE

## 2018-10-16 LAB — OB RESULTS CONSOLE ANTIBODY SCREEN: Antibody Screen: NEGATIVE

## 2018-10-16 LAB — OB RESULTS CONSOLE RUBELLA ANTIBODY, IGM: Rubella: IMMUNE

## 2018-10-16 LAB — HEPATITIS C ANTIBODY: HCV Ab: UNDETERMINED

## 2018-10-16 LAB — OB RESULTS CONSOLE ABO/RH: RH Type: POSITIVE

## 2018-10-16 LAB — OB RESULTS CONSOLE HIV ANTIBODY (ROUTINE TESTING): HIV: NONREACTIVE

## 2018-10-16 LAB — OB RESULTS CONSOLE HEPATITIS B SURFACE ANTIGEN: Hepatitis B Surface Ag: NEGATIVE

## 2018-12-15 ENCOUNTER — Encounter (HOSPITAL_BASED_OUTPATIENT_CLINIC_OR_DEPARTMENT_OTHER): Payer: Self-pay

## 2018-12-15 ENCOUNTER — Emergency Department (HOSPITAL_BASED_OUTPATIENT_CLINIC_OR_DEPARTMENT_OTHER)
Admission: EM | Admit: 2018-12-15 | Discharge: 2018-12-15 | Disposition: A | Discharge status: Home / Self Care | Payer: Medicaid Other | Source: Home / Self Care | Attending: Emergency Medicine | Admitting: Emergency Medicine

## 2018-12-15 ENCOUNTER — Encounter (HOSPITAL_BASED_OUTPATIENT_CLINIC_OR_DEPARTMENT_OTHER): Payer: Self-pay | Admitting: Emergency Medicine

## 2018-12-15 ENCOUNTER — Emergency Department (HOSPITAL_BASED_OUTPATIENT_CLINIC_OR_DEPARTMENT_OTHER): Payer: Medicaid Other

## 2018-12-15 ENCOUNTER — Emergency Department (HOSPITAL_BASED_OUTPATIENT_CLINIC_OR_DEPARTMENT_OTHER)
Admission: EM | Admit: 2018-12-15 | Discharge: 2018-12-15 | Disposition: A | Discharge status: Home / Self Care | Payer: Medicaid Other | Attending: Emergency Medicine | Admitting: Emergency Medicine

## 2018-12-15 ENCOUNTER — Other Ambulatory Visit: Payer: Self-pay

## 2018-12-15 DIAGNOSIS — R109 Unspecified abdominal pain: Secondary | ICD-10-CM

## 2018-12-15 DIAGNOSIS — Z79899 Other long term (current) drug therapy: Secondary | ICD-10-CM | POA: Insufficient documentation

## 2018-12-15 DIAGNOSIS — B379 Candidiasis, unspecified: Secondary | ICD-10-CM

## 2018-12-15 DIAGNOSIS — N200 Calculus of kidney: Secondary | ICD-10-CM | POA: Diagnosis not present

## 2018-12-15 DIAGNOSIS — R319 Hematuria, unspecified: Secondary | ICD-10-CM | POA: Diagnosis not present

## 2018-12-15 DIAGNOSIS — B373 Candidiasis of vulva and vagina: Secondary | ICD-10-CM | POA: Insufficient documentation

## 2018-12-15 LAB — CBC WITH DIFFERENTIAL/PLATELET
Abs Immature Granulocytes: 0.06 10*3/uL (ref 0.00–0.07)
Basophils Absolute: 0 10*3/uL (ref 0.0–0.1)
Basophils Relative: 0 %
Eosinophils Absolute: 0.2 10*3/uL (ref 0.0–0.5)
Eosinophils Relative: 2 %
HCT: 36.4 % (ref 36.0–46.0)
Hemoglobin: 11.4 g/dL — ABNORMAL LOW (ref 12.0–15.0)
Immature Granulocytes: 1 %
Lymphocytes Relative: 9 %
Lymphs Abs: 1 10*3/uL (ref 0.7–4.0)
MCH: 27.1 pg (ref 26.0–34.0)
MCHC: 31.3 g/dL (ref 30.0–36.0)
MCV: 86.5 fL (ref 80.0–100.0)
Monocytes Absolute: 0.5 10*3/uL (ref 0.1–1.0)
Monocytes Relative: 4 %
Neutro Abs: 9.4 10*3/uL — ABNORMAL HIGH (ref 1.7–7.7)
Neutrophils Relative %: 84 %
Platelets: 135 10*3/uL — ABNORMAL LOW (ref 150–400)
RBC: 4.21 MIL/uL (ref 3.87–5.11)
RDW: 15.9 % — ABNORMAL HIGH (ref 11.5–15.5)
WBC: 11.1 10*3/uL — ABNORMAL HIGH (ref 4.0–10.5)
nRBC: 0 % (ref 0.0–0.2)

## 2018-12-15 LAB — URINALYSIS, MICROSCOPIC (REFLEX)
RBC / HPF: >50 RBC/hpf (ref 0–5)
RBC / HPF: >50 RBC/hpf (ref 0–5)
Squamous Epithelial / HPF: NONE SEEN (ref 0–5)

## 2018-12-15 LAB — LIPASE, BLOOD: Lipase: 22 U/L (ref 11–51)

## 2018-12-15 LAB — COMPREHENSIVE METABOLIC PANEL
ALT: 13 U/L (ref 0–44)
AST: 18 U/L (ref 15–41)
Albumin: 3.3 g/dL — ABNORMAL LOW (ref 3.5–5.0)
Alkaline Phosphatase: 59 U/L (ref 38–126)
Anion gap: 8 (ref 5–15)
BUN: 11 mg/dL (ref 6–20)
CO2: 22 mmol/L (ref 22–32)
Calcium: 8.8 mg/dL — ABNORMAL LOW (ref 8.9–10.3)
Chloride: 108 mmol/L (ref 98–111)
Creatinine, Ser: 0.58 mg/dL (ref 0.44–1.00)
GFR calc Af Amer: >60 mL/min (ref >60)
GFR calc non Af Amer: >60 mL/min (ref >60)
Glucose, Bld: 101 mg/dL — ABNORMAL HIGH (ref 70–99)
Potassium: 3.5 mmol/L (ref 3.5–5.1)
Sodium: 138 mmol/L (ref 135–145)
Total Bilirubin: 0.2 mg/dL — ABNORMAL LOW (ref 0.3–1.2)
Total Protein: 7 g/dL (ref 6.5–8.1)

## 2018-12-15 LAB — URINALYSIS, ROUTINE W REFLEX MICROSCOPIC
Bilirubin Urine: NEGATIVE
Bilirubin Urine: NEGATIVE
Glucose, UA: NEGATIVE mg/dL
Glucose, UA: NEGATIVE mg/dL
Ketones, ur: NEGATIVE mg/dL
Ketones, ur: 15 mg/dL — AB
Leukocytes,Ua: NEGATIVE
Nitrite: NEGATIVE
Nitrite: NEGATIVE
Protein, ur: NEGATIVE mg/dL
Protein, ur: NEGATIVE mg/dL
Specific Gravity, Urine: >1.03 — ABNORMAL HIGH (ref 1.005–1.030)
Specific Gravity, Urine: >1.03 — ABNORMAL HIGH (ref 1.005–1.030)
pH: 6 (ref 5.0–8.0)
pH: 6 (ref 5.0–8.0)

## 2018-12-15 MED ORDER — TAMSULOSIN HCL 0.4 MG PO CAPS: 0.4000 mg | ORAL_CAPSULE | Freq: Every day | ORAL | 0 refills | Status: DC

## 2018-12-15 MED ORDER — ACETAMINOPHEN-CODEINE #3 300-30 MG PO TABS: 1.0000 | ORAL_TABLET | Freq: Two times a day (BID) | ORAL | 0 refills | Status: DC | PRN

## 2018-12-15 MED ORDER — ACETAMINOPHEN 500 MG PO TABS: 500.0000 mg | ORAL_TABLET | Freq: Four times a day (QID) | ORAL | 0 refills | Status: DC | PRN

## 2018-12-15 MED ORDER — METOCLOPRAMIDE HCL 10 MG PO TABS: 10.0000 mg | ORAL_TABLET | Freq: Four times a day (QID) | ORAL | 0 refills | Status: DC

## 2018-12-15 MED ORDER — ONDANSETRON HCL 4 MG/2ML IJ SOLN
4.0000 mg | Freq: Once | INTRAMUSCULAR | Status: AC
  Administered 2018-12-15: 4 mg via INTRAVENOUS
  Filled 2018-12-15: qty 2

## 2018-12-15 MED ORDER — ACETAMINOPHEN-CODEINE #3 300-30 MG PO TABS
1.0000 | ORAL_TABLET | Freq: Once | ORAL | Status: AC
  Administered 2018-12-15: 1 via ORAL
  Filled 2018-12-15: qty 1

## 2018-12-15 MED ORDER — FENTANYL CITRATE (PF) 100 MCG/2ML IJ SOLN
100.0000 ug | Freq: Once | INTRAMUSCULAR | Status: AC
  Administered 2018-12-15: 100 ug via INTRAVENOUS
  Filled 2018-12-15: qty 2

## 2018-12-15 MED ORDER — ONDANSETRON 4 MG PO TBDP
4.0000 mg | ORAL_TABLET | Freq: Once | ORAL | Status: AC
  Administered 2018-12-15: 4 mg via ORAL
  Filled 2018-12-15: qty 1

## 2018-12-15 MED ORDER — FLUCONAZOLE 150 MG PO TABS
150.0000 mg | ORAL_TABLET | Freq: Once | ORAL | Status: AC
  Administered 2018-12-15: 150 mg via ORAL
  Filled 2018-12-15: qty 1

## 2018-12-15 NOTE — ED Provider Notes (Signed)
[facility] EMERGENCY DEPARTMENT Provider Note   CSN: [id number] Arrival date & time: 12/15/18  6256   History   Chief Complaint Chief Complaint  Patient presents with  . Abdominal Pain    HPI Melanie Baldwin is a 33 y.o. [id number] female who is 21 weeks and 4 days pregnant who presents with abdominal pain. She goes to the [facility] for prenatal care. She states that she woke up with acute left-sided flank pain.  It radiates to the left upper quadrant.  Pain is constant and severe.  She has never had this before.  She called her OB provider who recommended her to come to the ED.  She denies pelvic pain, vaginal discharge or bleeding.  She feels baby moving normally.  She denies fever, chills, chest pain, shortness of breath, cough.  She has had several episodes of nausea and vomiting.  She denies change in her bowels or any urinary symptoms.   HPI  Past Medical History:  Diagnosis Date  . Cystic fibrosis carrier   . Normal pregnancy 09/24/2010  . TB (pulmonary tuberculosis)    took med for 6 months currently negative    Patient Active Problem List   Diagnosis Date Noted  . Delivery by vacuum extractor affecting fetus or newborn 09/25/2010  . SVD (spontaneous vaginal delivery) 09/24/2010    No past surgical history on file.   OB History    Gravida  3   Para  1   Term  1   Preterm  0   AB  0   Living  1     SAB  0   TAB  0   Ectopic  0   Multiple  0   Live Births  1            Home Medications    Prior to Admission medications   Medication Sig Start Date End Date Taking? Authorizing Provider  amoxicillin-clavulanate (AUGMENTIN) 875-125 MG tablet Take 1 tablet by mouth 2 (two) times daily. 01/08/15   Henson, Vickie L, NP-C  HYDROcodone-acetaminophen (NORCO) 5-325 MG tablet Take 1 tablet by mouth every 6 (six) hours as needed for moderate pain. 01/08/15   Avanell Shackleton, NP-C    Family History Family History  Problem  Relation Age of Onset  . Diabetes Paternal Grandmother     Social History Social History   Tobacco Use  . Smoking status: Never Smoker  . Smokeless tobacco: Never Used  Substance Use Topics  . Alcohol use: No  . Drug use: No     Allergies   Patient has no known allergies.   Review of Systems Review of Systems  Constitutional: Negative for chills and fever.  Respiratory: Negative for shortness of breath.   Cardiovascular: Negative for chest pain.  Gastrointestinal: Positive for nausea and vomiting. Negative for abdominal pain, blood in stool, constipation and diarrhea.  Genitourinary: Positive for flank pain. Negative for difficulty urinating, dysuria, pelvic pain, vaginal bleeding and vaginal discharge.  All other systems reviewed and are negative.    Physical Exam Updated Vital Signs BP 134/83 (BP Location: Right Arm)   Pulse 91   Temp 98.3 F (36.8 C) (Oral)   Resp 18   Ht 5\' 3"  (1.6 m)   Wt 75.8 kg   SpO2 98%   BMI 29.58 kg/m   Physical Exam Vitals signs and nursing note reviewed.  Constitutional:      General: She is not in acute distress.  Appearance: Normal appearance. She is well-developed. She is not ill-appearing.     Comments: Cooperative.  Uncomfortable appearing  HENT:     Head: Normocephalic and atraumatic.  Eyes:     General: No scleral icterus.       Right eye: No discharge.        Left eye: No discharge.     Conjunctiva/sclera: Conjunctivae normal.     Pupils: Pupils are equal, round, and reactive to light.  Neck:     Musculoskeletal: Normal range of motion.  Cardiovascular:     Rate and Rhythm: Normal rate and regular rhythm.  Pulmonary:     Effort: Pulmonary effort is normal. No respiratory distress.     Breath sounds: Normal breath sounds.  Abdominal:     General: There is no distension.     Palpations: Abdomen is soft.     Tenderness: There is no abdominal tenderness. There is left CVA tenderness. There is no right CVA  tenderness.     Comments: Gravid uterus. No tenderness over the uterus  L CVA tenderness  Skin:    General: Skin is warm and dry.  Neurological:     Mental Status: She is alert and oriented to person, place, and time.  Psychiatric:        Behavior: Behavior normal.      ED Treatments / Results  Labs (all labs ordered are listed, but only abnormal results are displayed) Labs Reviewed  CBC WITH DIFFERENTIAL/PLATELET - Abnormal; Notable for the following components:      Result Value   WBC 11.1 (*)    Hemoglobin 11.4 (*)    RDW 15.9 (*)    Platelets 135 (*)    Neutro Abs 9.4 (*)    All other components within normal limits  COMPREHENSIVE METABOLIC PANEL - Abnormal; Notable for the following components:   Glucose, Bld 101 (*)    Calcium 8.8 (*)    Albumin 3.3 (*)    Total Bilirubin 0.2 (*)    All other components within normal limits  URINALYSIS, ROUTINE W REFLEX MICROSCOPIC - Abnormal; Notable for the following components:   APPearance CLOUDY (*)    Specific Gravity, Urine >1.030 (*)    Hgb urine dipstick LARGE (*)    Leukocytes,Ua SMALL (*)    All other components within normal limits  URINALYSIS, MICROSCOPIC (REFLEX) - Abnormal; Notable for the following components:   Bacteria, UA MANY (*)    All other components within normal limits  URINALYSIS, ROUTINE W REFLEX MICROSCOPIC - Abnormal; Notable for the following components:   APPearance CLOUDY (*)    Specific Gravity, Urine >1.030 (*)    Hgb urine dipstick LARGE (*)    Ketones, ur 15 (*)    All other components within normal limits  URINALYSIS, MICROSCOPIC (REFLEX) - Abnormal; Notable for the following components:   Bacteria, UA MANY (*)    All other components within normal limits  URINE CULTURE  LIPASE, BLOOD    EKG None  Radiology [place] Renal  Result Date: 12/15/2018 CLINICAL DATA:  Left flank pain, [redacted] weeks gestation EXAM: RENAL / URINARY TRACT ULTRASOUND COMPLETE COMPARISON:  None. FINDINGS: Right Kidney:  Renal measurements: 11.7 x 5.2 x 5 cm = volume: 153 mL . Echogenicity within normal limits. No mass or hydronephrosis visualized. Left Kidney: Renal measurements: 12.1 x 6.3 x 5.1 cm = volume: 204 mL. Echogenicity within normal limits. No mass. Moderate hydronephrosis. Bladder: Not well evaluated due to poor distention. Other: None. IMPRESSION:  Moderate left hydronephrosis. Electronically Signed   By: Macy Mis M.D.   On: 12/15/2018 11:12    Procedures Procedures (including critical care time)  Medications Ordered in ED Medications  fluconazole (DIFLUCAN) tablet 150 mg (has no administration in time range)  fentaNYL (SUBLIMAZE) injection 100 mcg (100 mcg Intravenous Given 12/15/18 1013)  ondansetron (ZOFRAN) injection 4 mg (4 mg Intravenous Given 12/15/18 1012)     Initial Impression / Assessment and Plan / ED Course  I have reviewed the triage vital signs and the nursing notes.  Pertinent labs & imaging results that were available during my care of the patient were reviewed by me and considered in my medical decision making (see chart for details).  33 year old female presents with acute left flank pain since waking up this morning with associated N/V. Her vitals are normal. She is tender over the L flank. She does not have uterine tenderness and denies gushing of fluids or vaginal bleeding. Will defer pelvic at this time. She has had an [place] earlier this month which showed a normal IUP. Will obtain labs, UA, renal US. Will provide pain control and antiemetic. DDX: pyelo, kidney stone, pneumonia, pancreatitis, MSK pain  CBC is remarkable for mild leukocytosis of 11 and mild anemia (hemoglobin 11).  CMP and lipase are reassuring.  UA has large hemoglobin, small leukocytes, many bacteria, over 50 red blood cells, 6-10 white blood cells, and Ca ox crystals but also appears contaminated.  Renal ultrasound shows moderate hydronephrosis on the left.  High suspicion for kidney stone.  Will obtain a  repeat UA due to bacteria in the urine  Repeat UA again shows blood but has negative leukocytes and no nitrites.  He does still have many bacteria and budding yeast.  Discussed with Dr. Rex Kras. Will treat with Diflucan. Symptoms are not consistent with sepsis and will hold off on antibiotics at this time. Pain is controlled and she has tolerated PO. Will send rx for Tylenol, Reglan, and Flomax. She was given urology f/u. She was encouraged to return if she is worsening  Final Clinical Impressions(s) / ED Diagnoses   Final diagnoses:  Left flank pain  Kidney stone on left side  Hematuria, unspecified type  Yeast infection    ED Discharge Orders    None       Recardo Evangelist, PA-C 12/15/18 1307    Little, Wenda Overland, MD 12/15/18 1356

## 2018-12-15 NOTE — ED Triage Notes (Addendum)
Pt c/o pain to left abd-was seen here earlier for same-states she feels worse and that she is now "having a hard time to pee"-pt is [redacted] weeks pregnant-NAD-steady

## 2018-12-15 NOTE — ED Triage Notes (Signed)
Pt reports pain to upper LT abd that started this a.m. Vomited x 3. Denies vag bleeding or discharge. Sts pain is constant.

## 2018-12-15 NOTE — ED Notes (Signed)
Pt able to urinate.

## 2018-12-15 NOTE — Discharge Instructions (Addendum)
Take Tylenol for pain Take Reglan as needed for nausea or vomiting Take Tamsulosin to help you pass the kidney stone Follow up with urology Return if worsening

## 2018-12-15 NOTE — Discharge Instructions (Signed)
Take 500-650 mg of tylenol every 6 hours.   If you have breakthrough pain you may take one tablet of tylenol #3 every 12 hours. Take medication as directed and do not operate machinery, drive a car, or work while taking this medication as it can make you drowsy.   Please follow up with your OB-GYN in 1-3 days for re-evaluation of your symptoms. If you do not have a primary care provider, information for a healthcare clinic has been provided for you to make arrangements for follow up care. Please return to the emergency department for any new or worsening symptoms.

## 2018-12-15 NOTE — ED Provider Notes (Signed)
[facility] EMERGENCY DEPARTMENT Provider Note   CSN: [id number] Arrival date & time: 12/15/18  2039     History   Chief Complaint Chief Complaint  Patient presents with  . Abdominal Pain    HPI Melanie Baldwin is a 33 y.o. female.     HPI   33 year old female that is currently 21 weeks 4 days pregnant who presents to the emergency department today complaining of left flank pain and left upper abdominal pain.  She was seen earlier in the ED today with similar symptoms and was diagnosed with a suspected kidney stone.  Her urinalysis was clear at that time other than some hematuria.  She was discharged with a prescription for Tylenol.  She states that she took Tylenol around 3:00 today but her pain recurred and this brought her back to the ED.  She denies any persistent vomiting or fevers.  She does report some hematuria and dysuria.  Past Medical History:  Diagnosis Date  . Cystic fibrosis carrier   . Normal pregnancy 09/24/2010  . TB (pulmonary tuberculosis)    took med for 6 months currently negative    Patient Active Problem List   Diagnosis Date Noted  . Delivery by vacuum extractor affecting fetus or newborn 09/25/2010  . SVD (spontaneous vaginal delivery) 09/24/2010    History reviewed. No pertinent surgical history.   OB History    Gravida  4   Para  1   Term  1   Preterm  0   AB  0   Living  1     SAB  0   TAB  0   Ectopic  0   Multiple  0   Live Births  1            Home Medications    Prior to Admission medications   Medication Sig Start Date End Date Taking? Authorizing Provider  acetaminophen (TYLENOL) 500 MG tablet Take 1 tablet (500 mg total) by mouth every 6 (six) hours as needed. 12/15/18   Bethel Born, PA-C  acetaminophen-codeine (TYLENOL #3) 300-30 MG tablet Take 1 tablet by mouth every 12 (twelve) hours as needed for moderate pain. 12/15/18   Otie Headlee S, PA-C  amoxicillin-clavulanate (AUGMENTIN)  875-125 MG tablet Take 1 tablet by mouth 2 (two) times daily. 01/08/15   Henson, Vickie L, NP-C  HYDROcodone-acetaminophen (NORCO) 5-325 MG tablet Take 1 tablet by mouth every 6 (six) hours as needed for moderate pain. 01/08/15   Henson, Vickie L, NP-C  metoCLOPramide (REGLAN) 10 MG tablet Take 1 tablet (10 mg total) by mouth every 6 (six) hours. 12/15/18   Bethel Born, PA-C  Prenatal Vit-Fe Fumarate-FA (PRENATAL PO) Take by mouth.    [provider]  tamsulosin (FLOMAX) 0.4 MG CAPS capsule Take 1 capsule (0.4 mg total) by mouth daily. 12/15/18   Bethel Born, PA-C    Family History Family History  Problem Relation Age of Onset  . Diabetes Paternal Grandmother     Social History Social History   Tobacco Use  . Smoking status: Never Smoker  . Smokeless tobacco: Never Used  Substance Use Topics  . Alcohol use: No  . Drug use: No     Allergies   Patient has no known allergies.   Review of Systems Review of Systems  Constitutional: Negative for fever.  HENT: Negative for ear pain and sore throat.   Eyes: Negative for visual disturbance.  Respiratory: Negative for cough and shortness of  breath.   Cardiovascular: Negative for chest pain.  Gastrointestinal: Positive for abdominal pain (improved), nausea and vomiting (resolved). Negative for constipation and diarrhea.  Genitourinary: Positive for dysuria, flank pain, frequency and hematuria.  Musculoskeletal: Negative for back pain.  Skin: Negative for rash.  Neurological: Negative for headaches.  All other systems reviewed and are negative.    Physical Exam Updated Vital Signs BP 137/85 (BP Location: Left Arm)   Pulse 97   Temp 98.9 F (37.2 C) (Oral)   Resp 18   SpO2 100%   Physical Exam Vitals signs and nursing note reviewed.  Constitutional:      General: She is not in acute distress.    Appearance: She is well-developed. She is not ill-appearing or toxic-appearing.  HENT:     Head:  Normocephalic and atraumatic.  Eyes:     Conjunctiva/sclera: Conjunctivae normal.  Neck:     Musculoskeletal: Neck supple.  Cardiovascular:     Rate and Rhythm: Normal rate and regular rhythm.     Heart sounds: Normal heart sounds. No murmur.  Pulmonary:     Effort: Pulmonary effort is normal. No respiratory distress.     Breath sounds: Normal breath sounds. No wheezing, rhonchi or rales.  Abdominal:     General: Bowel sounds are normal.     Palpations: Abdomen is soft.     Tenderness: There is no abdominal tenderness. There is left CVA tenderness.  Skin:    General: Skin is warm and dry.  Neurological:     Mental Status: She is alert.     ED Treatments / Results  Labs (all labs ordered are listed, but only abnormal results are displayed) Labs Reviewed  CBC WITH DIFFERENTIAL/PLATELET    EKG None  Radiology US Renal  Result Date: 12/15/2018 CLINICAL DATA:  Left flank pain, [redacted] weeks gestation EXAM: RENAL / URINARY TRACT ULTRASOUND COMPLETE COMPARISON:  None. FINDINGS: Right Kidney: Renal measurements: 11.7 x 5.2 x 5 cm = volume: 153 mL . Echogenicity within normal limits. No mass or hydronephrosis visualized. Left Kidney: Renal measurements: 12.1 x 6.3 x 5.1 cm = volume: 204 mL. Echogenicity within normal limits. No mass. Moderate hydronephrosis. Bladder: Not well evaluated due to poor distention. Other: None. IMPRESSION: Moderate left hydronephrosis. Electronically Signed   By: Macy Mis M.D.   On: 12/15/2018 11:12    Procedures Procedures (including critical care time)  Medications Ordered in ED Medications  acetaminophen-codeine (TYLENOL #3) 300-30 MG per tablet 1 tablet (1 tablet Oral Given 12/15/18 2145)  ondansetron (ZOFRAN-ODT) disintegrating tablet 4 mg (4 mg Oral Given 12/15/18 2148)     Initial Impression / Assessment and Plan / ED Course  I have reviewed the triage vital signs and the nursing notes.  Pertinent labs & imaging results that were  available during my care of the patient were reviewed by me and considered in my medical decision making (see chart for details).     Final Clinical Impressions(s) / ED Diagnoses   Final diagnoses:  Left flank pain   33 year old female that is 21-week 4 days pregnant who presents the emergency department today for evaluation of persistent left-sided flank pain.  Reviewed records from prior visit earlier today.  She had reassuring work-up with minimal leukocytosis, normal renal function.  She did have some hematuria.  She had hydronephrosis on a renal ultrasound and was suspected to have a kidney stone.  She was discharged with Tylenol.  She presents with persistent pain.  She does have left  CVA tenderness on exam.  However her abdomen is soft and nontender.  She is not vomiting.  Reviewed labs.  Do not feel repeat labs indicated as patient was seen 12 hours ago in the ED.  Vital signs reassuring.  She is nontoxic, nonseptic appearing.  She has had no loss of fluids or vaginal bleeding. FHT 159.  Patient was given a dose of Tylenol 3 in the ED.  She reports significant improvement is now been able to tolerate p.o.  She feels ready to be discharged home.  Advise close follow-up with OB/GYN and gave her strict return precautions.  She voices understanding of the plan and reasons to return.  All questions answered.  Patient stable for discharge.  ED Discharge Orders         Ordered    acetaminophen-codeine (TYLENOL #3) 300-30 MG tablet  Every 12 hours PRN     12/15/18 2311           Karrie MeresCouture, Rasha Ibe S, PA-C 12/15/18 2311    Gwyneth SproutPlunkett, Whitney, MD 12/15/18 2343

## 2018-12-15 NOTE — ED Notes (Signed)
Pt drinking PO fluids.

## 2018-12-15 NOTE — ED Notes (Signed)
ED Provider at bedside. 

## 2018-12-16 LAB — URINE CULTURE: Culture: NO GROWTH

## 2019-01-17 ENCOUNTER — Emergency Department (HOSPITAL_BASED_OUTPATIENT_CLINIC_OR_DEPARTMENT_OTHER)
Admission: EM | Admit: 2019-01-17 | Discharge: 2019-01-17 | Disposition: A | Discharge status: Home / Self Care | Payer: Medicaid Other | Attending: Emergency Medicine | Admitting: Emergency Medicine

## 2019-01-17 ENCOUNTER — Other Ambulatory Visit: Payer: Self-pay

## 2019-01-17 ENCOUNTER — Encounter (HOSPITAL_BASED_OUTPATIENT_CLINIC_OR_DEPARTMENT_OTHER): Payer: Self-pay

## 2019-01-17 DIAGNOSIS — M5442 Lumbago with sciatica, left side: Secondary | ICD-10-CM | POA: Diagnosis not present

## 2019-01-17 DIAGNOSIS — M545 Low back pain: Secondary | ICD-10-CM | POA: Diagnosis present

## 2019-01-17 DIAGNOSIS — Z79899 Other long term (current) drug therapy: Secondary | ICD-10-CM | POA: Insufficient documentation

## 2019-01-17 LAB — URINALYSIS, ROUTINE W REFLEX MICROSCOPIC
Bilirubin Urine: NEGATIVE
Glucose, UA: NEGATIVE mg/dL
Hgb urine dipstick: NEGATIVE
Ketones, ur: NEGATIVE mg/dL
Nitrite: NEGATIVE
Protein, ur: NEGATIVE mg/dL
Specific Gravity, Urine: 1.02 (ref 1.005–1.030)
pH: 7.5 (ref 5.0–8.0)

## 2019-01-17 LAB — URINALYSIS, MICROSCOPIC (REFLEX)

## 2019-01-17 MED ORDER — HYDROMORPHONE HCL 1 MG/ML IJ SOLN
0.5000 mg | Freq: Once | INTRAMUSCULAR | Status: AC
  Administered 2019-01-17: 0.5 mg via INTRAMUSCULAR
  Filled 2019-01-17: qty 1

## 2019-01-17 MED ORDER — HYDROMORPHONE HCL 1 MG/ML IJ SOLN: 0.5000 mg | Freq: Once | INTRAMUSCULAR | Status: DC

## 2019-01-17 MED ORDER — ACETAMINOPHEN 500 MG PO TABS
1000.0000 mg | ORAL_TABLET | Freq: Once | ORAL | Status: AC
  Administered 2019-01-17: 1000 mg via ORAL
  Filled 2019-01-17: qty 2

## 2019-01-17 NOTE — ED Notes (Signed)
ED Provider at bedside. 

## 2019-01-17 NOTE — Progress Notes (Signed)
Called Kelly RN at [facility] to adjust monitors.

## 2019-01-17 NOTE — ED Notes (Signed)
Fetal heart tones traced for approx 1 minute; range 140-150's.

## 2019-01-17 NOTE — ED Provider Notes (Signed)
[facility] EMERGENCY DEPARTMENT Provider Note   CSN: [id number] Arrival date & time: 01/17/19  1824     History Chief Complaint  Patient presents with  . Back Pain    Melanie Baldwin is a 33 y.o. female G2P1 currently [redacted] weeks pregnant presents to ER for evaluation of back pain. Reports long history of this prior to pregnancy. She has had MRI of spine a few years ago and was told something was wrong with her "spinal cord", unclear what this was.  Her pain is bilateral in low back much worse on the left side. Described as electric, sharp central with radiation into left low back, buttock and down entire left leg to the heel.  There is associated numbness described as loss of feeling and tingling in the back of leg.  Pain was gradual onset 4-5 days ago, persistent and worsening now 8/10.  She has minimal pain if she is laying down or not moving.  Pain is worse with lifting left leg, walking, weight bearing and bending at the waist.  States 2-3 days before the back pain began she had left abdominal pain, nausea. She felt like urination was "a little harder" this day.  She vomited and this abdominal pain completely resolved. Urinating without issues now. One month ago was seen in the ER for "stomach pain" from a kidney stone on the left side.  States this pain is different and not abdominal but much lower on her back and buttock.  She denies trauma.  No associated abdominal or flank pain or pelvic pain. No LOF, vaginal bleeding, contractions.  No nausea, vomiting. No changes in BMs. No dysuria, hematuria. Current pregnancy is uncomplicated.   HPI     Past Medical History:  Diagnosis Date  . Cystic fibrosis carrier   . Normal pregnancy 09/24/2010  . TB (pulmonary tuberculosis)    took med for 6 months currently negative    Patient Active Problem List   Diagnosis Date Noted  . Delivery by vacuum extractor affecting fetus or newborn 09/25/2010  . SVD (spontaneous vaginal delivery)  09/24/2010    History reviewed. No pertinent surgical history.   OB History    Gravida  4   Para  1   Term  1   Preterm  0   AB  0   Living  1     SAB  0   TAB  0   Ectopic  0   Multiple  0   Live Births  1           Family History  Problem Relation Age of Onset  . Diabetes Paternal Grandmother     Social History   Tobacco Use  . Smoking status: Never Smoker  . Smokeless tobacco: Never Used  Substance Use Topics  . Alcohol use: No  . Drug use: No    Home Medications Prior to Admission medications   Medication Sig Start Date End Date Taking? Authorizing Provider  acetaminophen (TYLENOL) 500 MG tablet Take 1 tablet (500 mg total) by mouth every 6 (six) hours as needed. 12/15/18   Bethel Born, PA-C  acetaminophen-codeine (TYLENOL #3) 300-30 MG tablet Take 1 tablet by mouth every 12 (twelve) hours as needed for moderate pain. 12/15/18   Couture, Cortni S, PA-C  amoxicillin-clavulanate (AUGMENTIN) 875-125 MG tablet Take 1 tablet by mouth 2 (two) times daily. 01/08/15   Henson, Vickie L, NP-C  HYDROcodone-acetaminophen (NORCO) 5-325 MG tablet Take 1 tablet by mouth every 6 (  six) hours as needed for moderate pain. 01/08/15   Henson, Vickie L, NP-C  metoCLOPramide (REGLAN) 10 MG tablet Take 1 tablet (10 mg total) by mouth every 6 (six) hours. 12/15/18   Recardo Evangelist, PA-C  Prenatal Vit-Fe Fumarate-FA (PRENATAL PO) Take by mouth.    [provider]  tamsulosin (FLOMAX) 0.4 MG CAPS capsule Take 1 capsule (0.4 mg total) by mouth daily. 12/15/18   Recardo Evangelist, PA-C    Allergies    Patient has no known allergies.  Review of Systems   Review of Systems  Musculoskeletal: Positive for back pain.  Neurological: Positive for numbness (tingling).  All other systems reviewed and are negative.   Physical Exam Updated Vital Signs BP 107/69   Pulse 85   Temp 97.7 F (36.5 C) (Oral)   Resp 18   Ht 5\' 4"  (1.626 m)   Wt 78 kg   SpO2  100%   BMI 29.52 kg/m   Physical Exam Vitals and nursing note reviewed.  Constitutional:      Appearance: She is well-developed.     Comments: Non toxic in NAD  HENT:     Head: Normocephalic and atraumatic.     Nose: Nose normal.  Eyes:     Conjunctiva/sclera: Conjunctivae normal.  Cardiovascular:     Rate and Rhythm: Normal rate and regular rhythm.     Comments: 1+ radial DP pulses bilaterally. Pulmonary:     Effort: Pulmonary effort is normal.     Breath sounds: Normal breath sounds.  Abdominal:     General: Bowel sounds are normal.     Palpations: Abdomen is soft.     Tenderness: There is no abdominal tenderness.     Comments: Gravid abdomen, soft, nontender.  No suprapubic or CVA tenderness. Negative Murphy's and McBurney's.   Musculoskeletal:        General: Tenderness present. Normal range of motion.     Cervical back: Normal range of motion.       Back:     Comments:  TL spine: Midline lumbar and sacral tenderness.  Left lumbar, upper buttock, SI and sciatic notch tenderness.  Positive SLR on the left at approximately 15 to 20 degrees off the bed.  Pain significantly improved when left hip and knee are flexed.  Negative Faber.  Negative contralateral SLR.  Negative logroll bilaterally.  Skin:    General: Skin is warm and dry.     Capillary Refill: Capillary refill takes less than 2 seconds.  Neurological:     Mental Status: She is alert.     Comments: Sensation and strength intact in lower extremities.  Patient can lift both legs off the bed and hold for 5+ seconds, no weakness but reports lifting left leg off the bed causes more left low back pain.  Psychiatric:        Behavior: Behavior normal.     ED Results / Procedures / Treatments   Labs (all labs ordered are listed, but only abnormal results are displayed) Labs Reviewed  URINALYSIS, ROUTINE W REFLEX MICROSCOPIC - Abnormal; Notable for the following components:      Result Value   APPearance CLOUDY (*)      Leukocytes,Ua MODERATE (*)    All other components within normal limits  URINALYSIS, MICROSCOPIC (REFLEX) - Abnormal; Notable for the following components:   Bacteria, UA FEW (*)    All other components within normal limits  URINE CULTURE    EKG None  Radiology No results  found.  Procedures Procedures (including critical care time)  Medications Ordered in ED Medications  acetaminophen (TYLENOL) tablet 1,000 mg (1,000 mg Oral Given 01/17/19 1935)  HYDROmorphone (DILAUDID) injection 0.5 mg (0.5 mg Intramuscular Given 01/17/19 1936)    ED Course  I have reviewed the triage vital signs and the nursing notes.  Pertinent labs & imaging results that were available during my care of the patient were reviewed by me and considered in my medical decision making (see chart for details).  Clinical Course as of Jan 16 2300  Wed Jan 17, 2019  2122 Patient reevaluated, reports improvement in pain.  No pain when laying down, worse with standing.   [CG]    Clinical Course User Index [CG] Jerrell MylarGibbons, Jovahn Breit J, PA-C   MDM Rules/Calculators/A&P                      EMR reviewed.  Seen twice last month for left flank pain.  Ultrasound 11/20 showed left moderate hydronephrosis but no kidney stones.  Patient reports having an MRI for low back pain for a long time, unable to review this.  Her symptoms, exam today are very suggestive of MSK etiology.  Pain is lower, bilateral.  Tenderness midline lumbar, left-sided lumbar, SI/sciatic notch and buttock.  Positive SLR as well.  She has minimal pain if she is sitting still or laying down.  She has no other concerning symptoms like fever, nausea, vomiting, urinary symptoms, changes in bowel movements, saddle anesthesia, loss of bladder or bowel control.  No OB complaints.  Rapid OB nurse contacted here who recommended fetal heart rate Doppler which was normal.  Given presentation, exam and lack of other localizing GI, GU and OB complaints considered,  kidney stone, pyelonephritis, GI process unlikely.  She has no abdominal or pelvic pain, contractions, leaking fluid, vaginal bleeding to suggest OB pathology.  Urinalysis obtained is contaminated and poor sample, no RBCs.  Again evaluated and had improvement in pain after Dilaudid and Tylenol.  She is ambulatory.  Repeat abdominal exam is benign, no CVA tenderness.  No abdominal tenderness either.  She has no UTI symptoms and urinalysis is contaminated, will send for urine culture and defer antibiotics here.  Given chronicity of symptoms, exam suggestive of MSK etiology and otherwise benign exam I do not think further emergent lab work or imaging is indicated.  Encouraged Lidoderm patch, massage, heat, Tylenol and Ortho follow-up for other pain modalities and pregnancy.  Return precautions given.  She has close follow-up with OB on 1/4 and urology on 1/5.  She is comfortable with this plan.  Discussed with EDP.   Final Clinical Impression(s) / ED Diagnoses Final diagnoses:  Acute left-sided low back pain with left-sided sciatica    Rx / DC Orders ED Discharge Orders    None       Liberty HandyGibbons, Rodgers Likes J, PA-C 01/17/19 2301    Virgina Norfolkuratolo, Adam, DO 01/17/19 2305

## 2019-01-17 NOTE — ED Notes (Signed)
Patient ambulated to bathroom to collect urine sample; states pain has improved with ambulation; states pain is worse when getting up and sitting back down.

## 2019-01-17 NOTE — ED Triage Notes (Addendum)
Pt c/o lower back pain that radiates down left LE x 6 days-seen by OB 3 days ago f/u visit where back pain was addressed and oxycodone rx with no relief-pt is [redacted] weeks pregnant-denies vaginal bleeding and discharge-NAD-steady gait

## 2019-01-17 NOTE — Progress Notes (Signed)
G4P1 at 26 2/7 weeks reports to [facility] for c/o radiating leg pain x6 days.  Saw OB 3 days ago.  Obtained rx for oxycodone.  Reports no relief with medication. Monitors applied per [facility] RN.  Receives [facility] in [employer] at [employer].

## 2019-01-17 NOTE — Discharge Instructions (Signed)
You were seen in the ER for left low back, buttock and leg pain  Urine today was contaminated but you had no bladder or kidney infection symptoms.  Your urine was sent to the lab to confirm and rule out an infection.  I suspect your symptoms and pain are from a muscular source possibly inflammation of the nerve in your back.  Alternate 500 to 1000 mg of acetaminophen every 6-8 hours for mild to moderate pain.  Apply over-the-counter Lidoderm/lidocaine pain patches to the area of pain.  Can cover with a heating pad.  Stretch, massage and light walking can also help.  Only take oxycodone prescribed to you for severe breakthrough pain.  Avoid prolonged sitting or laying.  Return to the ER immediately for fever greater than 100, nausea, vomiting, abdominal or pelvic pain, contractions, leaking fluid or vaginal bleeding, weakness to your legs, loss of bladder or bowel function or control, urinary symptoms to suggest urinary infection

## 2019-01-17 NOTE — Progress Notes (Signed)
Called Dr. Laurena Bering to inform about G4P1 patient presenting to [facility] at [redacted]w[redacted]d with lower back pain radiating to legs.  FHR strip appears to be tracing MHR (around 90, sometimes doubling to about 180).  MD says that if RN is able to doppler a FHR, pt does not need a strip due to absence of OB complaint and early gestation and is OB cleared for discharge. Doppler of 155 obtained at 2000.

## 2019-01-17 NOTE — ED Notes (Signed)
Rapid response OB contacted. Advised they will place pt on the monitor.

## 2019-01-19 LAB — URINE CULTURE: Culture: NO GROWTH

## 2019-01-22 ENCOUNTER — Other Ambulatory Visit: Payer: Self-pay

## 2019-01-22 ENCOUNTER — Encounter: Payer: Self-pay | Admitting: Family Medicine

## 2019-01-22 ENCOUNTER — Ambulatory Visit: Payer: Medicaid Other | Admitting: Family Medicine

## 2019-01-22 VITALS — BP 114/78 | Ht 63.0 in | Wt 169.0 lb

## 2019-01-22 DIAGNOSIS — M5442 Lumbago with sciatica, left side: Secondary | ICD-10-CM | POA: Diagnosis not present

## 2019-01-22 MED ORDER — CYCLOBENZAPRINE HCL 5 MG PO TABS: 5.0000 mg | ORAL_TABLET | Freq: Three times a day (TID) | ORAL | 0 refills | Status: DC | PRN

## 2019-01-22 MED ORDER — PREDNISONE 5 MG PO TABS: ORAL_TABLET | ORAL | 0 refills | Status: DC

## 2019-01-22 NOTE — Patient Instructions (Signed)
Nice to meet you Please try heat  Please try gentle stretching and movements.  Physical therapy will give you a call.  Please postpone your 28 week OB appointment to a week later  Please send me a message in MyChart with any questions or updates.  Please see me back in 4 weeks.   --Dr. Raeford Razor

## 2019-01-22 NOTE — Assessment & Plan Note (Signed)
Seems to have nerve impingement as well as spasm.  Obvious trunk shift on exam. -Prednisone. -Flexeril. -Counseled on home exercise therapy and supportive care. -Referral to physical therapy. -Could consider trigger point injections.

## 2019-01-22 NOTE — Progress Notes (Signed)
Melanie Baldwin - 33 y.o. female MRN [id number]  Date of birth: [birth year]  SUBJECTIVE:  Including CC & ROS.  Chief Complaint  Patient presents with  . Back Pain    left-sided low back x 2 weeks    Melanie Baldwin is a 33 y.o. female that is presenting with acute low back pain with left-sided sciatica.  Symptoms of been ongoing for about a month.  This is her second pregnancy and she did not have any complications with the first pregnancy.  She is [redacted] weeks gestation.  She has a urology for Monday for possible kidney stones.  Pain is moderate to severe.  It is a stabbing in the lower back with significant pain down the leg.  The pain is going the lateral aspect.  She feels it most with transitioning from sitting to standing.  She cannot get in the cuff to position at night.   Review of Systems  Constitutional: Negative for fever.  HENT: Negative for congestion.   Respiratory: Negative for cough.   Cardiovascular: Negative for chest pain.  Gastrointestinal: Negative for abdominal pain.  Musculoskeletal: Positive for back pain.  Skin: Negative for color change.  Neurological: Negative for weakness.  Hematological: Negative for adenopathy.    HISTORY: Past Medical, Surgical, Social, and Family History Reviewed & Updated per EMR.   Pertinent Historical Findings include:  Past Medical History:  Diagnosis Date  . Cystic fibrosis carrier   . Normal pregnancy 09/24/2010  . TB (pulmonary tuberculosis)    took med for 6 months currently negative    No past surgical history on file.  No Known Allergies  Family History  Problem Relation Age of Onset  . Diabetes Paternal Grandmother      Social History   Socioeconomic History  . Marital status: Married    Spouse name: Not on file  . Number of children: Not on file  . Years of education: Not on file  . Highest education level: Not on file  Occupational History  . Not on file  Tobacco Use  . Smoking status: Never Smoker  .  Smokeless tobacco: Never Used  Substance and Sexual Activity  . Alcohol use: No  . Drug use: No  . Sexual activity: Not on file  Other Topics Concern  . Not on file  Social History Narrative  . Not on file   Social Determinants of Health   Financial Resource Strain:   . Difficulty of Paying Living Expenses: Not on file  Food Insecurity:   . Worried About Programme researcher, broadcasting/film/video in the Last Year: Not on file  . Ran Out of Food in the Last Year: Not on file  Transportation Needs:   . Lack of Transportation (Medical): Not on file  . Lack of Transportation (Non-Medical): Not on file  Physical Activity:   . Days of Exercise per Week: Not on file  . Minutes of Exercise per Session: Not on file  Stress:   . Feeling of Stress : Not on file  Social Connections:   . Frequency of Communication with Friends and Family: Not on file  . Frequency of Social Gatherings with Friends and Family: Not on file  . Attends Religious Services: Not on file  . Active Member of Clubs or Organizations: Not on file  . Attends Banker Meetings: Not on file  . Marital Status: Not on file  Intimate Partner Violence:   . Fear of Current or Ex-Partner: Not on file  . Emotionally  Abused: Not on file  . Physically Abused: Not on file  . Sexually Abused: Not on file     PHYSICAL EXAM:  VS: BP 114/78   Ht 5\' 3"  (1.6 m)   Wt 169 lb (76.7 kg)   BMI 29.94 kg/m  Physical Exam Gen: NAD, alert, cooperative with exam, well-appearing ENT: normal lips, normal nasal mucosa,  Eye: normal EOM, normal conjunctiva and lids CV:  no edema, +2 pedal pulses   Resp: no accessory muscle use, non-labored,  Skin: no rashes, no areas of induration  Neuro: normal tone, normal sensation to touch Psych:  normal insight, alert and oriented MSK:  Back/left hip: No tenderness to palpation of the greater trochanter. Trunk shift apparent. Normal internal and external rotation. Normal strength resistance. Positive  straight leg raise on the left. Neurovascularly intact     ASSESSMENT & PLAN:   Acute bilateral low back pain with left-sided sciatica Seems to have nerve impingement as well as spasm.  Obvious trunk shift on exam. -Prednisone. -Flexeril. -Counseled on home exercise therapy and supportive care. -Referral to physical therapy. -Could consider trigger point injections.

## 2019-01-26 NOTE — L&D Delivery Note (Addendum)
OB/GYN Faculty Practice Delivery Note  Melanie Baldwin is a 34 y.o. G2P1001 s/p SVD at [redacted]w[redacted]d. She was admitted for [facility] 4/8 and post dates.   ROM: 1h 8m with clear fluid GBS Status:  Negative/-- (03/01 0000) Maximum Maternal Temperature: 98.4 F  Labor Progress: . Initial SVE: 4/50/-3. Received Cytotec, foley ballon and pitocin. She then progressed to complete.   Delivery Date/Time: 4/2 @ 1350 Delivery: Called to room and patient was complete and pushing. Head delivered ROP. Nuchal cord present x1 and reduced. Shoulder and body delivered in usual fashion. Infant with spontaneous cry, placed on mother's abdomen, dried and stimulated. Cord clamped x 2 after 1-minute delay, and cut by FOB. Cord blood drawn. Placenta delivered spontaneously with gentle cord traction. True knot present in cord. Fundus firm with massage and Pitocin. Labia, perineum, vagina, and cervix inspected inspected with first degree laceration that was repaired with 3-0 Vicryl in a standard fashion.   Baby Weight: pending  Placenta: Sent to L&D Complications: None Lacerations: first degree  EBL: 125 mL Analgesia: Epidural   Infant:  APGAR (1 MIN): 8   APGAR (5 MINS): 9   APGAR (10 MINS):     Katha Cabal, DO PGY-1, [facility] 04/27/2019 2:21 PM

## 2019-01-29 ENCOUNTER — Other Ambulatory Visit: Payer: Self-pay

## 2019-01-29 ENCOUNTER — Ambulatory Visit: Payer: Medicaid Other | Attending: Family Medicine | Admitting: Physical Therapy

## 2019-01-29 DIAGNOSIS — R2689 Other abnormalities of gait and mobility: Secondary | ICD-10-CM | POA: Insufficient documentation

## 2019-01-29 DIAGNOSIS — M5442 Lumbago with sciatica, left side: Secondary | ICD-10-CM | POA: Insufficient documentation

## 2019-01-29 DIAGNOSIS — R293 Abnormal posture: Secondary | ICD-10-CM | POA: Insufficient documentation

## 2019-01-29 DIAGNOSIS — R29898 Other symptoms and signs involving the musculoskeletal system: Secondary | ICD-10-CM | POA: Insufficient documentation

## 2019-01-29 DIAGNOSIS — M6281 Muscle weakness (generalized): Secondary | ICD-10-CM | POA: Diagnosis present

## 2019-01-29 NOTE — Therapy (Signed)
[facility] [place] [address]  [place], [place], [address] Phone: [phone]   Fax:  [phone]  Physical Therapy Evaluation  Patient Details  Name: Melanie Baldwin MRN: [id number] Date of Birth: 17-Dec-1985 Referring Provider (PT): Clearance Coots, MD   Encounter Date: 01/29/2019  PT End of Session - 01/29/19 0845    Visit Number  1    Number of Visits  15    Date for PT Re-Evaluation  03/26/19    Authorization Type  Medicaid    PT Start Time  0845    PT Stop Time  0934    PT Time Calculation (min)  49 min    Activity Tolerance  Patient limited by pain    Behavior During Therapy  [facility] for tasks assessed/performed       Past Medical History:  Diagnosis Date  . Cystic fibrosis carrier   . Normal pregnancy 09/24/2010  . TB (pulmonary tuberculosis)    took med for 6 months currently negative    No past surgical history on file.  There were no vitals filed for this visit.   Subjective Assessment - 01/29/19 0852    Subjective  Pt reporting 3 week h/o sudden onset LBP & L hip pain with radicular pain and tingling into L leg to foot w/o known MOI. States L LE "doesn't feel like it works the way it is supposed to". Notes some improvement over tha weekend (previously 10/10).    Pertinent History  [redacted] weeks pregnant    Limitations  Sitting;Standing;Walking    How long can you sit comfortably?  has to lean to lean and support upper body on arm    How long can you stand comfortably?  2-3 minutes    How long can you walk comfortably?  2-3 minutes - walks with limp    Patient Stated Goals  "I would like to feel better soon."    Currently in Pain?  Yes    Pain Score  8    7-8/10   Pain Location  Back    Pain Orientation  Left    Pain Descriptors / Indicators  Sharp;Constant    Pain Type  Acute pain    Pain Radiating Towards  pain & tingling into L LE    Pain Onset  1 to 4 weeks ago    Pain Frequency  Constant     Aggravating Factors   rolling over in bed, walking    Pain Relieving Factors  pain meds    Effect of Pain on Daily Activities  walks with limp - very limited standing/walking tolerance         [facility] PT Assessment - 01/29/19 0845      Assessment   Medical Diagnosis  Acute B LBP with L sided-sciatica    Referring Provider (PT)  Clearance Coots, MD    Onset Date/Surgical Date  --   ~3 weeks   Next MD Visit  02/19/19    Prior Therapy  none      Precautions   Precautions  Other (comment)    Precaution Comments  [redacted] weeks pregnant      Restrictions   Weight Bearing Restrictions  No      Balance Screen   Has the patient fallen in the past 6 months  No    Has the patient had a decrease in activity level because of a fear of falling?   No    Is the patient  reluctant to leave their home because of a fear of falling?   No      Home Public house manager residence    Living Arrangements  Spouse/significant other;Children    Type of Home  Apartment    Home Access  Level entry    Home Layout  One level      Prior Function   Level of Independence  Independent    Vocation  Part time employment    Leisure  stay home      Cognition   Overall Cognitive Status  Within Functional Limits for tasks assessed      Posture/Postural Control   Posture/Postural Control  Postural limitations    Postural Limitations  Right pelvic obliquity      ROM / Strength   AROM / PROM / Strength  AROM;Strength      AROM   AROM Assessment Site  Lumbar    Lumbar Flexion  75% limited with shift to L    Lumbar Extension  50% limited    Lumbar - Right Side Bend  hand to lateral knee - increased pain    Lumbar - Left Side Bend  hand to lateral knee    Lumbar - Right Rotation  WFL    Lumbar - Left Rotation  [facility]      Strength   Strength Assessment Site  Hip;Knee    Right/Left Hip  Right;Left    Right Hip Flexion  3+/5    Right Hip Extension  3+/5    Right Hip External Rotation   4-/5     Right Hip Internal Rotation  4/5    Right Hip ABduction  4-/5    Right Hip ADduction  3+/5    Left Hip Flexion  4-/5    Left Hip Extension  3+/5    Left Hip External Rotation  4/5    Left Hip Internal Rotation  4/5    Left Hip ABduction  4-/5    Left Hip ADduction  3+/5    Right/Left Knee  Right;Left    Right Knee Flexion  4+/5    Right Knee Extension  4/5   pain in back   Left Knee Flexion  4+/5    Left Knee Extension  4/5   pain in back     Flexibility   Soft Tissue Assessment /Muscle Length  yes    Hamstrings  limited tolerance for assessment due to pain but appears tight B    ITB  mild/mod tight B    Piriformis  limited tolerance for assessment due to pain but appears mild/mod tight B                Objective measurements completed on examination: See above findings.      [facility] Adult PT Treatment/Exercise - 01/29/19 0845      Self-Care   Self-Care  Posture    Posture  Provided instruction in sleeping posture      Exercises   Exercises  Lumbar      Lumbar Exercises: Stretches   Passive Hamstring Stretch Limitations  attempted manual supine stretch as well as seated stretch - pt unable to tolerate either position    ITB Stretch  Left;30 seconds;2 reps    ITB Stretch Limitations  sidelying ITB/QL stretch with leg extended back over edge of plinth    Piriformis Stretch  Left;30 seconds;2 reps    Piriformis Stretch Limitations  supine & seated KTOS - pt preferring  supine      Manual Therapy   Manual Therapy  Passive ROM;Manual Traction    Manual therapy comments  supine & R sidelying    Passive ROM  L QL/ITB stretch with PT applying caudal pressure on iliac crest - pt noting dercreased pain with this    Manual Traction  gentle L LE longitudinal distraction - pt noting decreased pain with this but rebound pain when distraction pressure released             PT Education - 01/29/19 0934    Education Details  PT eval findings, anticipated POC &  initial HEP    Person(s) Educated  Patient    Methods  Explanation;Demonstration;Handout    Comprehension  Verbalized understanding;Returned demonstration;Need further instruction       PT Short Term Goals - 01/29/19 0934      PT SHORT TERM GOAL #1   Title  Patient will be independent with initial HEP    Baseline  Initial HEP provided on eval    Status  New    Target Date  02/12/19      PT SHORT TERM GOAL #2   Title  Patient will verbalize/demonstrate understanding of neutral spine posture and proper body mechanics to reduce strain on lumbar spine    Baseline  Education provided for sleeping posture; further education to be provided next visit    Status  New    Target Date  02/12/19      PT SHORT TERM GOAL #3   Title  Patient will report >/=25 % reduction in LBP and/or L LE radicular pain    Baseline  7-8/10    Status  New    Target Date  02/12/19        PT Long Term Goals - 01/29/19 0934      PT LONG TERM GOAL #1   Title  Patient will be independent with ongoing/advanced HEP    Status  New    Target Date  03/26/19      PT LONG TERM GOAL #2   Title  Patient to demonstrate appropriate posture and body mechanics needed for daily activities    Status  New    Target Date  03/26/19      PT LONG TERM GOAL #3   Title  Patient will demosntrate neutral lumbopelvic alignment w/o evidence of R pelvic obliquity    Status  New    Target Date  03/26/19      PT LONG TERM GOAL #4   Title  Patient to improve lumbar AROM to [facility] (as pregnancy permits) without pain provocation    Status  New    Target Date  03/26/19      PT LONG TERM GOAL #5   Title  Patient will improve B proximal LE strength to >/= 4/5 to 4+/5 for improved core stability    Status  New    Target Date  03/26/19             Plan - 01/29/19 0934    Clinical Impression Statement  Sunnie is a 34 y/o 28-wk pregnant female who presents to OP PT for acute LBP with L sided sciatica. She reports sudden onset of  intense pain ~3 weeks ago w/o known MOI. Some relief noted over the weekend, presumably resulting from the steroid dose pack but pain still at 7-8/10 with intermittent radicular pain and tingling into L LE. Pain causing lateral shift with R pelvic obliquity, present in standing/walking,  sitting and supine. Lumbar ROM and proximal LE flexibility assessment limited by pain and pelvic obliquity. Proximal LE weakness also noted. Pain and postural malalignment cause her to walk with a limp, with pain also limiting ability to reposition in bed or find a comfortable sleeping position.  Lakiya will benefit from skilled PT services to address aforementioned impairments and allow for resumption of normal daily activities with decreased pain interference.    Personal Factors and Comorbidities  Comorbidity 1;Past/Current Experience;Time since onset of injury/illness/exacerbation    Comorbidities  [redacted] weeks pregnant    Examination-Activity Limitations  Bathing;Bed Mobility;Bend;Caring for Others;Carry;Hygiene/Grooming;Lift;Locomotion Level;Sit;Sleep;Squat;Stand;Transfers    Examination-Participation Restrictions  Cleaning;Community Activity;Driving;Meal Prep    Stability/Clinical Decision Making  Evolving/Moderate complexity    Clinical Decision Making  Moderate    Rehab Potential  Good    PT Frequency  2x / week    PT Duration  8 weeks    PT Treatment/Interventions  ADLs/Self Care Home Management;Cryotherapy;Moist Heat;Gait training;Functional mobility training;Therapeutic activities;Therapeutic exercise;Neuromuscular re-education;Patient/family education;Manual techniques;Passive range of motion;Dry needling;Taping;Spinal Manipulations;Joint Manipulations    PT Next Visit Plan  Review initial HEP; posture & body mechanics training; lumbopelvic mobility and stretching as tolerated; thermal modalities and/or taping as indicated    PT Home Exercise Plan  01/29/19 - sleeping posture, piriformis & QL/ITB stretches     Consulted and Agree with Plan of Care  Patient       Patient will benefit from skilled therapeutic intervention in order to improve the following deficits and impairments:  Abnormal gait, Decreased activity tolerance, Decreased knowledge of precautions, Decreased mobility, Decreased range of motion, Decreased strength, Difficulty walking, Increased fascial restricitons, Increased muscle spasms, Impaired flexibility, Postural dysfunction, Improper body mechanics, Pain  Visit Diagnosis: Acute bilateral low back pain with left-sided sciatica  Abnormal posture  Muscle weakness (generalized)  Other abnormalities of gait and mobility     Problem List Patient Active Problem List   Diagnosis Date Noted  . Acute bilateral low back pain with left-sided sciatica 01/22/2019  . Delivery by vacuum extractor affecting fetus or newborn 09/25/2010  . SVD (spontaneous vaginal delivery) 09/24/2010    Marry Guan, PT, MPT 01/29/2019, 4:16 PM  [facility] [address]  [address] [place], [place], [address] Phone: [phone]   Fax:  [phone]  Name: Kelsye Loomer MRN: [id number] Date of Birth: 07-27-1985

## 2019-01-29 NOTE — Patient Instructions (Signed)
    Home exercise program created by [name], PT.  For questions, please contact [name] via phone at [phone] or email at [name]  [facility] [place] [address]  [address] [place], [place], [address] Phone: [phone]   Fax:  [phone]    

## 2019-02-01 ENCOUNTER — Ambulatory Visit: Payer: Medicaid Other

## 2019-02-01 ENCOUNTER — Other Ambulatory Visit: Payer: Self-pay

## 2019-02-01 DIAGNOSIS — M5442 Lumbago with sciatica, left side: Secondary | ICD-10-CM

## 2019-02-01 DIAGNOSIS — R293 Abnormal posture: Secondary | ICD-10-CM

## 2019-02-01 DIAGNOSIS — M6281 Muscle weakness (generalized): Secondary | ICD-10-CM

## 2019-02-01 DIAGNOSIS — R2689 Other abnormalities of gait and mobility: Secondary | ICD-10-CM

## 2019-02-01 NOTE — Therapy (Signed)
[facility] [address]  [address] [place], [place], [address] Phone: [phone]   Fax:  [phone]  Physical Therapy Treatment  Patient Details  Name: Melanie Baldwin MRN: [id number] Date of Birth: August 11, 1985 Referring Provider (PT): Clare Gandy, MD   Encounter Date: 02/01/2019  PT End of Session - 02/01/19 0815    Visit Number  2    Number of Visits  15    Date for PT Re-Evaluation  03/26/19    Authorization Type  Medicaid    Authorization Time Period  02/01/19 - 02/08/19    PT Start Time  0803    PT Stop Time  0908   Ended visit with 10 min moist heat   PT Time Calculation (min)  65 min    Activity Tolerance  Patient limited by pain    Behavior During Therapy  [facility] for tasks assessed/performed       Past Medical History:  Diagnosis Date  . Cystic fibrosis carrier   . Normal pregnancy 09/24/2010  . TB (pulmonary tuberculosis)    took med for 6 months currently negative    History reviewed. No pertinent surgical history.  There were no vitals filed for this visit.  Subjective Assessment - 02/01/19 0806    Subjective  Pt. noting HEP activities have helped to reduce her pain levels some.    Pertinent History  [redacted] weeks pregnant    Patient Stated Goals  "I would like to feel better soon."    Currently in Pain?  Yes    Pain Score  6     Pain Location  Back    Pain Orientation  Left    Pain Descriptors / Indicators  Sharp;Constant    Pain Type  Acute pain    Pain Radiating Towards  Pain and tingling into L LE at night    Pain Frequency  Constant    Aggravating Factors   prolonged, rolling over in the bed    Pain Relieving Factors  pain meds, HEP activities    Multiple Pain Sites  No                       [facility] Adult PT Treatment/Exercise - 02/01/19 0001      Self-Care   Self-Care  Other Self-Care Comments    Other Self-Care Comments   Discussion of proper posture and body mechanics with daily  actvities such as sitting at computer at work, need for changing positions to avoid prolonged standing and sitting (as this is painful trigger for pt.), kitchen work at home with staggered stance as to reduce lumbar/hip strain; pt. verbalized understanding       Lumbar Exercises: Stretches   Passive Hamstring Stretch  1 rep;30 seconds;Left    Passive Hamstring Stretch Limitations  seated with heel proper on 9" stool; some increased pain however able to perform     ITB Stretch  Left;30 seconds;3 reps    ITB Stretch Limitations  sidelying ITB/QL stretch with leg extended back over edge of plinth    Piriformis Stretch  Left;4 reps;30 seconds    Piriformis Stretch Limitations  supine & seated KTOS       Lumbar Exercises: Aerobic   Nustep  Lvl 1, 6 min (UE/LE)      Lumbar Exercises: Seated   Sit to Stand  10 reps    Sit to Stand Limitations  yellow TB at knees      Manual  Therapy   Manual Therapy  Soft tissue mobilization;Myofascial release    Manual therapy comments  R sidelying with L LE elevated on bolster     Soft tissue mobilization  STM/DTM to L superior buttocks, piriformis, glute med     Myofascial Release  TPR to L glute med and L superior buttocks                PT Short Term Goals - 02/01/19 0854      PT SHORT TERM GOAL #1   Title  Patient will be independent with initial HEP    Baseline  Initial HEP provided on eval    Status  On-going    Target Date  02/12/19      PT SHORT TERM GOAL #2   Title  Patient will verbalize/demonstrate understanding of neutral spine posture and proper body mechanics to reduce strain on lumbar spine    Baseline  Education provided for sleeping posture; further education to be provided next visit    Status  On-going    Target Date  02/12/19      PT SHORT TERM GOAL #3   Title  Patient will report >/=25 % reduction in LBP and/or L LE radicular pain    Baseline  7-8/10    Status  On-going    Target Date  02/12/19        PT Long  Term Goals - 02/01/19 0820      PT LONG TERM GOAL #1   Title  Patient will be independent with ongoing/advanced HEP    Status  On-going      PT LONG TERM GOAL #2   Title  Patient to demonstrate appropriate posture and body mechanics needed for daily activities    Status  On-going      PT LONG TERM GOAL #3   Title  Patient will demosntrate neutral lumbopelvic alignment w/o evidence of R pelvic obliquity    Status  On-going      PT LONG TERM GOAL #4   Title  Patient to improve lumbar AROM to [facility] (as pregnancy permits) without pain provocation    Status  On-going      PT LONG TERM GOAL #5   Title  Patient will improve B proximal LE strength to >/= 4/5 to 4+/5 for improved core stability    Status  On-going            Plan - 02/01/19 6283    Clinical Impression Statement  Pt. noting good relief from performance of initial HEP.  Notes she is mixing in LE stretching from HEP activities throughout her workday and at home with good relief.  Reviewed HEP activities with only minor cueing required at times for proper technique with sidelying ITB stretch.  Initiated instruction today in proper posture and body mechanics with daily tasks, sleeping positioning, sitting and standing posture, and encouraged pt. to change positions frequently at work as she notes prolonged standing and sitting is trigger for increased pain.  Initiated MT to address increased tension in L proximal hip musculature and performed gentle lumbopelvic strengthening targeting glutes to end session.  Ended visit with application of moist heat to L buttocks with relief noted following this.  Progressing toward LTG #3.    Comorbidities  [redacted] weeks pregnant    Rehab Potential  Good    PT Treatment/Interventions  ADLs/Self Care Home Management;Cryotherapy;Moist Heat;Gait training;Functional mobility training;Therapeutic activities;Therapeutic exercise;Neuromuscular re-education;Patient/family education;Manual techniques;Passive  range of motion;Dry needling;Taping;Spinal Manipulations;Joint Manipulations  PT Next Visit Plan  Review initial HEP; posture & body mechanics training; lumbopelvic mobility and stretching as tolerated; thermal modalities and/or taping as indicated    PT Home Exercise Plan  01/29/19 - sleeping posture, piriformis & QL/ITB stretches    Consulted and Agree with Plan of Care  Patient       Patient will benefit from skilled therapeutic intervention in order to improve the following deficits and impairments:  Abnormal gait, Decreased activity tolerance, Decreased knowledge of precautions, Decreased mobility, Decreased range of motion, Decreased strength, Difficulty walking, Increased fascial restricitons, Increased muscle spasms, Impaired flexibility, Postural dysfunction, Improper body mechanics, Pain  Visit Diagnosis: Acute bilateral low back pain with left-sided sciatica  Abnormal posture  Muscle weakness (generalized)  Other abnormalities of gait and mobility     Problem List Patient Active Problem List   Diagnosis Date Noted  . Acute bilateral low back pain with left-sided sciatica 01/22/2019  . Delivery by vacuum extractor affecting fetus or newborn 09/25/2010  . SVD (spontaneous vaginal delivery) 09/24/2010    Kermit Balo, PTA 02/01/19 9:30 AM   [facility] [address]  [address] [place], [place], [address] Phone: [phone]   Fax:  [phone]  Name: Ellyce Lafevers MRN: [id number] Date of Birth: [birth year]

## 2019-02-01 NOTE — Patient Instructions (Signed)

## 2019-02-05 ENCOUNTER — Ambulatory Visit: Payer: Medicaid Other | Admitting: Physical Therapy

## 2019-02-05 ENCOUNTER — Other Ambulatory Visit: Payer: Self-pay

## 2019-02-05 ENCOUNTER — Encounter: Payer: Self-pay | Admitting: Physical Therapy

## 2019-02-05 DIAGNOSIS — R293 Abnormal posture: Secondary | ICD-10-CM

## 2019-02-05 DIAGNOSIS — M5442 Lumbago with sciatica, left side: Secondary | ICD-10-CM

## 2019-02-05 DIAGNOSIS — M6281 Muscle weakness (generalized): Secondary | ICD-10-CM

## 2019-02-05 DIAGNOSIS — R2689 Other abnormalities of gait and mobility: Secondary | ICD-10-CM

## 2019-02-05 NOTE — Therapy (Addendum)
[facility] [address]  [place], [place], [address] Phone: [phone]   Fax:  [phone]  Physical Therapy Treatment  Patient Details  Name: Melanie Baldwin MRN: [id number] Date of Birth: 28-Jul-1985 Referring Provider (PT): Clearance Coots, MD   Encounter Date: 02/05/2019  PT End of Session - 02/05/19 0839    Visit Number  3    Number of Visits  15    Date for PT Re-Evaluation  03/26/19    Authorization Type  Medicaid    Authorization Time Period  02/01/19 - 02/08/19    PT Start Time  0839    PT Stop Time  0939    PT Time Calculation (min)  60 min    Activity Tolerance  Patient limited by pain    Behavior During Therapy  [facility] for tasks assessed/performed       Past Medical History:  Diagnosis Date  . Cystic fibrosis carrier   . Normal pregnancy 09/24/2010  . TB (pulmonary tuberculosis)    took med for 6 months currently negative    History reviewed. No pertinent surgical history.  There were no vitals filed for this visit.  Subjective Assessment - 02/05/19 0842    Subjective  Pt reporting pain had been better last Friday after PT on Thursday, but got bad again on Saturday.    Pertinent History  [redacted] weeks pregnant    Patient Stated Goals  "I would like to feel better soon."    Currently in Pain?  Yes    Pain Score  8     Pain Location  Back    Pain Orientation  Left    Pain Descriptors / Indicators  Sharp;Constant    Pain Type  Acute pain    Pain Radiating Towards  L buttocks and calf    Pain Frequency  Constant                       [facility] Adult PT Treatment/Exercise - 02/05/19 0839      Exercises   Exercises  Lumbar      Lumbar Exercises: Stretches   Passive Hamstring Stretch  Right;Left;30 seconds;1 rep    Passive Hamstring Stretch Limitations  seated hip hinge with heel on floor    ITB Stretch  Left;30 seconds;2 reps   ITB Stretch Limitations  standing lateral lean over back of  chair   Other Lumbar Stretch  Left QL stretch in standing 30 sec x 2 - leaning into wall     Lumbar Exercises: Aerobic   Nustep  L2 x 6 min (UE/LE)      Lumbar Exercises: Supine   Clam  10 reps;3 seconds    Clam Limitations  hooklying hip ABD/ER with yellow TB    Bent Knee Raise  10 reps;3 seconds    Bent Knee Raise Limitations  brace marching with yellow TB    Other Supine Lumbar Exercises  B hip adduction isometric with ball 10 x 5"; B hip abduction isometric with belt 10 x 5"      Modalities   Modalities  Moist Heat      Moist Heat Therapy   Number Minutes Moist Heat  10 Minutes    Moist Heat Location  Lumbar Spine;Hip   Left - in R sidelying     Manual Therapy   Manual Therapy  Soft tissue mobilization;Myofascial release;Passive ROM;Manual Traction    Manual therapy comments  supine & R  sidelying    Soft tissue mobilization  STM/DTM to L QL, glutes & piriformis    Myofascial Release  manual TPR to L piriformis & glute medius    Passive ROM  L QL/ITB stretch with PT applying caudal pressure on iliac crest - pt noting dercreased pain with this    Manual Traction  gentle L LE longitudinal distraction - pt noting decreased pain with this but rebound pain when distraction pressure released             PT Education - 02/05/19 0930    Education Details  HEP update - standing QL/ITB stretches, hip adduction isometric, hookyling clam with yellow TB, self-STM with ball on wall for glutes/piriformis/QL    Person(s) Educated  Patient    Methods  Explanation;Demonstration;Verbal cues;Tactile cues;Handout    Comprehension  Verbalized understanding;Returned demonstration;Verbal cues required;Tactile cues required;Need further instruction       PT Short Term Goals - 02/05/19 0847      PT SHORT TERM GOAL #1   Title  Patient will be independent with initial HEP    Baseline  Initial HEP provided on eval    Status  On-going    Target Date  02/12/19      PT SHORT TERM GOAL #2    Title  Patient will verbalize/demonstrate understanding of neutral spine posture and proper body mechanics to reduce strain on lumbar spine    Status  Achieved   02/05/19     PT SHORT TERM GOAL #3   Title  Patient will report >/=25 % reduction in LBP and/or L LE radicular pain    Baseline  7-8/10    Status  On-going    Target Date  02/12/19        PT Long Term Goals - 02/01/19 0820      PT LONG TERM GOAL #1   Title  Patient will be independent with ongoing/advanced HEP    Status  On-going      PT LONG TERM GOAL #2   Title  Patient to demonstrate appropriate posture and body mechanics needed for daily activities    Status  On-going      PT LONG TERM GOAL #3   Title  Patient will demosntrate neutral lumbopelvic alignment w/o evidence of R pelvic obliquity    Status  On-going      PT LONG TERM GOAL #4   Title  Patient to improve lumbar AROM to [facility] (as pregnancy permits) without pain provocation    Status  On-going      PT LONG TERM GOAL #5   Title  Patient will improve B proximal LE strength to >/= 4/5 to 4+/5 for improved core stability    Status  On-going            Plan - 02/05/19 0846    Clinical Impression Statement  Sharese reporting her pain had been improving following initial 2 therapy session, however pain increasing again as of Saturday w/o known trigger with pelvic obliquity and lateral trunk shift more apparent again today. No evidence of pelvic rotation present but L iliac crest elevated relative to R with ongoing tightness present in L QL and glutes/piriformis which were addressed with STM/DTM, MFR/TPR and gentle traction/stretching. Alignment more neutral following manual therapy. She denies any questions or concerns regarding posture & body mechanics training from last visit and feels comfortable with initial HEP - STGs #1 & 2 met, therefore progressed stretching and exercises with instruction in standing alternatives for  QL/ITB stretching as well as  lumbopelvic stabilization and strengthening exercises which were well tolerated. Session concluded with moist heat to L QL and buttocks to promote further muscle relaxation and pain reduction as patient noting prior benefit.    Comorbidities  [redacted] weeks pregnant    Rehab Potential  Good    PT Frequency  2x / week    PT Duration  8 weeks    PT Treatment/Interventions  ADLs/Self Care Home Management;Cryotherapy;Moist Heat;Gait training;Functional mobility training;Therapeutic activities;Therapeutic exercise;Neuromuscular re-education;Patient/family education;Manual techniques;Passive range of motion;Dry needling;Taping;Spinal Manipulations;Joint Manipulations    PT Next Visit Plan  Medicaid reauthorization; lumbopelvic mobility and stretching as tolerated; thermal modalities and/or taping as indicated    PT Home Exercise Plan  01/29/19 - sleeping posture, piriformis & QL/ITB stretches; 02/05/19 - standing QL/ITB stretches, hip adduction isometric, hookyling clam with yellow TB, self-STM with ball on wall for glutes/piriformis/QL    Consulted and Agree with Plan of Care  Patient       Patient will benefit from skilled therapeutic intervention in order to improve the following deficits and impairments:  Abnormal gait, Decreased activity tolerance, Decreased knowledge of precautions, Decreased mobility, Decreased range of motion, Decreased strength, Difficulty walking, Increased fascial restricitons, Increased muscle spasms, Impaired flexibility, Postural dysfunction, Improper body mechanics, Pain  Visit Diagnosis: Acute bilateral low back pain with left-sided sciatica  Abnormal posture  Muscle weakness (generalized)  Other abnormalities of gait and mobility     Problem List Patient Active Problem List   Diagnosis Date Noted  . Acute bilateral low back pain with left-sided sciatica 01/22/2019  . Delivery by vacuum extractor affecting fetus or newborn 09/25/2010  . SVD (spontaneous vaginal  delivery) 09/24/2010    Percival Spanish, PT, MPT 02/05/2019, 12:34 PM  [facility] [address]  [place], [place], [address] Phone: [phone]   Fax:  [phone]  Name: Annita Ratliff MRN: [id number] Date of Birth: 1985-05-14

## 2019-02-08 ENCOUNTER — Other Ambulatory Visit: Payer: Self-pay

## 2019-02-08 ENCOUNTER — Ambulatory Visit: Payer: Medicaid Other | Admitting: Physical Therapy

## 2019-02-08 ENCOUNTER — Encounter: Payer: Self-pay | Admitting: Physical Therapy

## 2019-02-08 DIAGNOSIS — R29898 Other symptoms and signs involving the musculoskeletal system: Secondary | ICD-10-CM

## 2019-02-08 DIAGNOSIS — M5442 Lumbago with sciatica, left side: Secondary | ICD-10-CM | POA: Diagnosis not present

## 2019-02-08 DIAGNOSIS — R2689 Other abnormalities of gait and mobility: Secondary | ICD-10-CM

## 2019-02-08 DIAGNOSIS — R293 Abnormal posture: Secondary | ICD-10-CM

## 2019-02-08 DIAGNOSIS — M6281 Muscle weakness (generalized): Secondary | ICD-10-CM

## 2019-02-08 NOTE — Therapy (Addendum)
[facility] [address]  [place], [place], [address] Phone: [phone]   Fax:  [phone]  Physical Therapy Treatment  Patient Details  Name: Melanie Baldwin MRN: [id number] Date of Birth: December 26, 1985 Referring Provider (PT): Clearance Coots, MD   Encounter Date: 02/08/2019  PT End of Session - 02/08/19 0803    Visit Number  4    Number of Visits  15    Date for PT Re-Evaluation  03/26/19    Authorization Type  Medicaid    Authorization Time Period  02/01/19 - 02/08/19    Authorization - Visit Number  3    Authorization - Number of Visits  3    PT Start Time  0803    PT Stop Time  6222    PT Time Calculation (min)  51 min    Activity Tolerance  Patient limited by pain    Behavior During Therapy  [facility] for tasks assessed/performed       Past Medical History:  Diagnosis Date  . Cystic fibrosis carrier   . Normal pregnancy 09/24/2010  . TB (pulmonary tuberculosis)    took med for 6 months currently negative    History reviewed. No pertinent surgical history.  There were no vitals filed for this visit.  Subjective Assessment - 02/08/19 0807    Subjective  Pt reports the pain in her buttock is essentially unchangeed but the distal LE radicular pain is better.    Pertinent History  [redacted] weeks pregnant    Patient Stated Goals  "I would like to feel better soon."    Currently in Pain?  Yes    Pain Score  6     Pain Location  Back   & L buttock   Pain Orientation  Left    Pain Descriptors / Indicators  Sharp;Constant    Pain Type  Acute pain    Pain Radiating Towards  L buttock    Pain Frequency  Constant                       [facility] Adult PT Treatment/Exercise - 02/08/19 0803      Exercises   Exercises  Lumbar      Lumbar Exercises: Aerobic   Nustep  L3 x 6 min (UE/LE)      Lumbar Exercises: Standing   Other Standing Lumbar Exercises  R/L glute medius 45 dg kick back with looped yellow TB  at ankles x 10      Lumbar Exercises: Seated   Sit to Stand  10 reps    Sit to Stand Limitations  yellow TB at knees      Lumbar Exercises: Supine   Clam  15 reps;3 seconds    Clam Limitations  hooklying alt hip ABD/ER with yellow TB    Bent Knee Raise  10 reps;3 seconds    Bent Knee Raise Limitations  brace marching with yellow TB      Lumbar Exercises: Sidelying   Clam  Right;Left;10 reps;3 seconds    Clam Limitations  yellow TB      Modalities   Modalities  Moist Heat      Moist Heat Therapy   Number Minutes Moist Heat  10 Minutes    Moist Heat Location  Lumbar Spine;Hip   Left - in R sidelying     Manual Therapy   Manual Therapy  Joint mobilization;Soft tissue mobilization;Myofascial release;Passive ROM;Manual Traction    Manual  therapy comments  supine & R sidelying    Joint Mobilization  R P/A SIJ mobs - grade II-III    Soft tissue mobilization  STM/DTM to L QL, glutes & piriformis - only still tender in upper glutes today    Myofascial Release  manual TPR to L glute medius/minimus    Passive ROM  L QL/ITB stretch with PT applying caudal pressure on iliac crest    Manual Traction  gentle L LE longitudinal distraction - pt noting decreased pain               PT Short Term Goals - 02/08/19 0810      PT SHORT TERM GOAL #1   Title  Patient will be independent with initial HEP    Baseline  --    Status  Achieved   02/08/19     PT SHORT TERM GOAL #2   Title  Patient will verbalize/demonstrate understanding of neutral spine posture and proper body mechanics to reduce strain on lumbar spine    Status  Achieved   02/05/19     PT SHORT TERM GOAL #3   Title  Patient will report >/= 25% reduction in LBP and/or L LE radicular pain    Baseline  7-8/10 on eval    Status  Achieved   02/08/19 - "almost 50% improvement"       PT Long Term Goals - 02/08/19 0810      PT LONG TERM GOAL #1   Title  Patient will be independent with ongoing/advanced HEP    Status   On-going    Target Date  03/26/19      PT LONG TERM GOAL #2   Title  Patient to demonstrate appropriate posture and body mechanics needed for daily activities    Status  On-going    Target Date  03/26/19      PT LONG TERM GOAL #3   Title  Patient will demonstrate neutral lumbopelvic alignment w/o evidence of R pelvic obliquity    Status  On-going    Target Date  03/26/19      PT LONG TERM GOAL #4   Title  Patient to improve lumbar AROM to [facility] (as pregnancy permits) without pain provocation    Status  On-going    Target Date  03/26/19      PT LONG TERM GOAL #5   Title  Patient will improve B proximal LE strength to >/= 4/5 to 4+/5 for improved core stability    Status  On-going    Target Date  03/26/19            Plan - 02/08/19 0809    Clinical Impression Statement  Melanie Baldwin reporting her pain is "almost 50% better" since start of PT with pain appearing to centralize as she no longer notes calf/distal LE pain on L, noting pain primarily localized to L buttock at present. She reports improved awareness of posture and body mechanics which has improved her sleeping comfort and her activity tolerance with typical daily tasks at home. She is independent with her initial HEP and reports good compliance with HEP. All STGs met. She continues to demonstrate mild R pelvic obliquity and anterior rotation of pelvis on sacrum, but this is improving resulting in decreased lateral shift of trunk. Ongoing pain and weakness continue to limit functional mobility and activity tolerance. Melanie Baldwin will continue to benefit from skilled PT to address remaining deficits allowing for return to normal activity without pain interference.  Personal Factors and Comorbidities  Comorbidity 1;Past/Current Experience;Time since onset of injury/illness/exacerbation    Comorbidities  [redacted] weeks pregnant    Examination-Activity Limitations  Bathing;Bed Mobility;Bend;Caring for  Others;Carry;Hygiene/Grooming;Lift;Locomotion Level;Sit;Sleep;Squat;Stand;Transfers    Examination-Participation Restrictions  Cleaning;Community Activity;Driving;Meal Prep    Rehab Potential  Good    PT Frequency  2x / week    PT Duration  8 weeks    PT Treatment/Interventions  ADLs/Self Care Home Management;Cryotherapy;Moist Heat;Gait training;Functional mobility training;Therapeutic activities;Therapeutic exercise;Neuromuscular re-education;Patient/family education;Manual techniques;Passive range of motion;Dry needling;Taping;Spinal Manipulations;Joint Manipulations    PT Next Visit Plan  lumbopelvic mobility and stretching as tolerated; core and proximal LE strengthening; thermal modalities and/or taping (possible glute medius) as indicated    PT Home Exercise Plan  01/29/19 - sleeping posture, piriformis & QL/ITB stretches; 02/05/19 - standing QL/ITB stretches, hip adduction isometric, hookyling clam with yellow TB, self-STM with ball on wall for glutes/piriformis/QL    Consulted and Agree with Plan of Care  Patient       Patient will benefit from skilled therapeutic intervention in order to improve the following deficits and impairments:  Abnormal gait, Decreased activity tolerance, Decreased knowledge of precautions, Decreased mobility, Decreased range of motion, Decreased strength, Difficulty walking, Increased fascial restricitons, Increased muscle spasms, Impaired flexibility, Postural dysfunction, Improper body mechanics, Pain  Visit Diagnosis: Acute bilateral low back pain with left-sided sciatica  Abnormal posture  Muscle weakness (generalized)  Other abnormalities of gait and mobility  Other symptoms and signs involving the musculoskeletal system     Problem List Patient Active Problem List   Diagnosis Date Noted  . Acute bilateral low back pain with left-sided sciatica 01/22/2019  . Delivery by vacuum extractor affecting fetus or newborn 09/25/2010  . SVD (spontaneous  vaginal delivery) 09/24/2010    Percival Spanish, PT, MPT 02/08/2019, 9:24 AM  [facility] [address]  [place], [place], [address] Phone: [phone]   Fax:  [phone]  Name: Paytin Ramakrishnan MRN: [id number] Date of Birth: [birth year]

## 2019-02-14 ENCOUNTER — Other Ambulatory Visit: Payer: Self-pay

## 2019-02-14 ENCOUNTER — Ambulatory Visit: Payer: Medicaid Other | Admitting: Physical Therapy

## 2019-02-14 ENCOUNTER — Encounter: Payer: Self-pay | Admitting: Physical Therapy

## 2019-02-14 DIAGNOSIS — M6281 Muscle weakness (generalized): Secondary | ICD-10-CM

## 2019-02-14 DIAGNOSIS — M5442 Lumbago with sciatica, left side: Secondary | ICD-10-CM

## 2019-02-14 DIAGNOSIS — R293 Abnormal posture: Secondary | ICD-10-CM

## 2019-02-14 DIAGNOSIS — R29898 Other symptoms and signs involving the musculoskeletal system: Secondary | ICD-10-CM

## 2019-02-14 DIAGNOSIS — R2689 Other abnormalities of gait and mobility: Secondary | ICD-10-CM

## 2019-02-14 NOTE — Therapy (Addendum)
[facility] [address]  [address] [place], [place], [address] Phone: [phone]   Fax:  [phone]  Physical Therapy Treatment  Patient Details  Name: Melanie Baldwin MRN: [id number] Date of Birth: [birth year] Referring Provider (PT): Clare Gandy, MD   Encounter Date: 02/14/2019  PT End of Session - 02/14/19 0803    Visit Number  5    Number of Visits  15    Date for PT Re-Evaluation  03/26/19    Authorization Type  Medicaid    Authorization Time Period  02/14/19 - 03/27/19    Authorization - Visit Number  1    Authorization - Number of Visits  12    PT Start Time  0803    PT Stop Time  0854    PT Time Calculation (min)  51 min    Activity Tolerance  Patient tolerated treatment well    Behavior During Therapy  [facility] for tasks assessed/performed       Past Medical History:  Diagnosis Date  . Cystic fibrosis carrier   . Normal pregnancy 09/24/2010  . TB (pulmonary tuberculosis)    took med for 6 months currently negative    History reviewed. No pertinent surgical history.  There were no vitals filed for this visit.  Subjective Assessment - 02/14/19 0807    Subjective  Pt reporting her pain is now just in her back.    Pertinent History  [redacted] weeks pregnant    Patient Stated Goals  "I would like to feel better soon."    Currently in Pain?  Yes    Pain Score  5    4-5/10   Pain Location  Back    Pain Orientation  Lower;Left    Pain Descriptors / Indicators  Sharp;Constant    Pain Type  Acute pain    Pain Frequency  Constant                       [facility] Adult PT Treatment/Exercise - 02/14/19 0803      Lumbar Exercises: Stretches   Quadruped Mid Back Stretch  30 seconds;3 reps    Quadruped Mid Back Stretch Limitations  seated 3-way prayer stretch with green Pball    Other Lumbar Stretch Exercise  Left QL stretch in standing 30 sec x 2 - leaning into wall and holding onto doorframe      Lumbar  Exercises: Aerobic   Nustep  L4 x 6 min (UE/LE)      Lumbar Exercises: Seated   Hip Flexion on Ball  Both;10 reps;AROM    Hip Flexion on Ball Limitations  green Pball    Other Seated Lumbar Exercises  A/P & lateral pelvic tilts on green Pball x 10 each    Other Seated Lumbar Exercises  B rows & R/L pallof press with red TB x 10 each; seated on green Pball      Lumbar Exercises: Supine   Bridge Limitations  less painful but still deviating to R      Lumbar Exercises: Quadruped   Straight Leg Raise  10 reps;3 seconds      Modalities   Modalities  Moist Heat      Moist Heat Therapy   Number Minutes Moist Heat  10 Minutes    Moist Heat Location  Lumbar Spine;Hip   Left - in R sidelying with pillow under lateral pelvis     Manual Therapy   Manual Therapy  Manual  Traction;Other (comment)    Manual Traction  gentle L LE longitudinal distraction    Other Manual Therapy  L lateral pelvic shift / R torso shift                PT Short Term Goals - 02/08/19 0810      PT SHORT TERM GOAL #1   Title  Patient will be independent with initial HEP    Baseline  --    Status  Achieved   02/08/19     PT SHORT TERM GOAL #2   Title  Patient will verbalize/demonstrate understanding of neutral spine posture and proper body mechanics to reduce strain on lumbar spine    Status  Achieved   02/05/19     PT SHORT TERM GOAL #3   Title  Patient will report >/= 25% reduction in LBP and/or L LE radicular pain    Baseline  7-8/10 on eval    Status  Achieved   02/08/19 - "almost 50% improvement"       PT Long Term Goals - 02/08/19 0810      PT LONG TERM GOAL #1   Title  Patient will be independent with ongoing/advanced HEP    Status  On-going    Target Date  03/26/19      PT LONG TERM GOAL #2   Title  Patient to demonstrate appropriate posture and body mechanics needed for daily activities    Status  On-going    Target Date  03/26/19      PT LONG TERM GOAL #3   Title  Patient will  demonstrate neutral lumbopelvic alignment w/o evidence of R pelvic obliquity    Status  On-going    Target Date  03/26/19      PT LONG TERM GOAL #4   Title  Patient to improve lumbar AROM to [facility] (as pregnancy permits) without pain provocation    Status  On-going    Target Date  03/26/19      PT LONG TERM GOAL #5   Title  Patient will improve B proximal LE strength to >/= 4/5 to 4+/5 for improved core stability    Status  On-going    Target Date  03/26/19            Plan - 02/14/19 0809    Clinical Impression Statement  Shanzay reporting pain now more centralized to low back with decreasing lateral shift/pelvic obliquity in standing but still demonstrating deviation to R in supine or when attempting bridge. Promoted pelvic mobility and stabilization while seated on green Pball with continued improvement noted in pelvic obliquity both while seated on Pball and when returning to stand after Pball exercises. Treatment concluded with moist heat to L QL and posterior/lateral hip in slight static QL stretch in side lying over pillow.    Personal Factors and Comorbidities  Comorbidity 1;Past/Current Experience;Time since onset of injury/illness/exacerbation    Comorbidities  [redacted] weeks pregnant    Examination-Activity Limitations  Bathing;Bed Mobility;Bend;Caring for Others;Carry;Hygiene/Grooming;Lift;Locomotion Level;Sit;Sleep;Squat;Stand;Transfers    Examination-Participation Restrictions  Cleaning;Community Activity;Driving;Meal Prep    Rehab Potential  Good    PT Frequency  2x / week    PT Duration  8 weeks    PT Treatment/Interventions  ADLs/Self Care Home Management;Cryotherapy;Moist Heat;Gait training;Functional mobility training;Therapeutic activities;Therapeutic exercise;Neuromuscular re-education;Patient/family education;Manual techniques;Passive range of motion;Dry needling;Taping;Spinal Manipulations;Joint Manipulations    PT Next Visit Plan  lumbopelvic mobility and stretching as  tolerated; core and proximal LE strengthening; thermal modalities and/or taping (possible  glute medius) as indicated    PT Home Exercise Plan  01/29/19 - sleeping posture, piriformis & QL/ITB stretches; 02/05/19 - standing QL/ITB stretches, hip adduction isometric, hookyling clam with yellow TB, self-STM with ball on wall for glutes/piriformis/QL    Consulted and Agree with Plan of Care  Patient       Patient will benefit from skilled therapeutic intervention in order to improve the following deficits and impairments:  Abnormal gait, Decreased activity tolerance, Decreased knowledge of precautions, Decreased mobility, Decreased range of motion, Decreased strength, Difficulty walking, Increased fascial restricitons, Increased muscle spasms, Impaired flexibility, Postural dysfunction, Improper body mechanics, Pain  Visit Diagnosis: Acute bilateral low back pain with left-sided sciatica  Abnormal posture  Muscle weakness (generalized)  Other abnormalities of gait and mobility  Other symptoms and signs involving the musculoskeletal system     Problem List Patient Active Problem List   Diagnosis Date Noted  . Acute bilateral low back pain with left-sided sciatica 01/22/2019  . Delivery by vacuum extractor affecting fetus or newborn 09/25/2010  . SVD (spontaneous vaginal delivery) 09/24/2010    Percival Spanish, PT, MPT 02/14/2019, 9:20 AM  [facility] [address]  [place], [place], [address] Phone: [phone]   Fax:  [phone]  Name: Yalonda Sample MRN: [id number] Date of Birth: Sep 22, 1985

## 2019-02-16 ENCOUNTER — Ambulatory Visit: Payer: Medicaid Other

## 2019-02-16 ENCOUNTER — Other Ambulatory Visit: Payer: Self-pay

## 2019-02-16 DIAGNOSIS — M5442 Lumbago with sciatica, left side: Secondary | ICD-10-CM

## 2019-02-16 DIAGNOSIS — R29898 Other symptoms and signs involving the musculoskeletal system: Secondary | ICD-10-CM

## 2019-02-16 DIAGNOSIS — M6281 Muscle weakness (generalized): Secondary | ICD-10-CM

## 2019-02-16 DIAGNOSIS — R293 Abnormal posture: Secondary | ICD-10-CM

## 2019-02-16 DIAGNOSIS — R2689 Other abnormalities of gait and mobility: Secondary | ICD-10-CM

## 2019-02-16 NOTE — Therapy (Signed)
[facility] [address]  [place], [place], [address] Phone: [phone]   Fax:  [phone]  Physical Therapy Treatment  Patient Details  Name: Melanie Baldwin MRN: [id number] Date of Birth: 03-28-1985 Referring Provider (PT): Clearance Coots, MD   Encounter Date: 02/16/2019  PT End of Session - 02/16/19 0807    Visit Number  6    Number of Visits  15    Date for PT Re-Evaluation  03/26/19    Authorization Type  Medicaid    Authorization Time Period  02/14/19 - 03/27/19    Authorization - Visit Number  2    Authorization - Number of Visits  12    PT Start Time  0802    PT Stop Time  0915   Ended visit with 10 min moist heat   PT Time Calculation (min)  73 min    Activity Tolerance  Patient tolerated treatment well    Behavior During Therapy  [facility] for tasks assessed/performed       Past Medical History:  Diagnosis Date  . Cystic fibrosis carrier   . Normal pregnancy 09/24/2010  . TB (pulmonary tuberculosis)    took med for 6 months currently negative    No past surgical history on file.  There were no vitals filed for this visit.  Subjective Assessment - 02/16/19 0805    Subjective  Pt. reporting her pain was much improved with improved sleep quality after last session.    Pertinent History  [redacted] weeks pregnant    Patient Stated Goals  "I would like to feel better soon."    Currently in Pain?  Yes    Pain Score  2     Pain Location  Back    Pain Orientation  Lower;Left    Pain Descriptors / Indicators  Sharp;Constant    Pain Type  Acute pain    Pain Radiating Towards  into L buttocks    Multiple Pain Sites  No         [facility] PT Assessment - 02/16/19 0001      AROM   Lumbar Flexion  50% limited hands to just below knee caps     Lumbar Extension  50% limited    Lumbar - Right Side Bend  hand just below lateral knee     Lumbar - Left Side Bend  hand just below lateral knee    Lumbar - Right Rotation   WFL    Lumbar - Left Rotation  WFL   3-4/10 pain with L rotation      Strength   Strength Assessment Site  Hip;Knee    Right/Left Hip  Right;Left    Right Hip Flexion  4/5    Right Hip Extension  4/5   tested standing    Right Hip External Rotation   4/5    Right Hip Internal Rotation  4+/5    Right Hip ABduction  4/5    Right Hip ADduction  4/5    Left Hip Flexion  4/5    Left Hip Extension  4/5   tested in standing    Left Hip External Rotation  4-/5    Left Hip Internal Rotation  4/5    Left Hip ABduction  4/5    Left Hip ADduction  4/5    Right/Left Knee  Right;Left    Right Knee Flexion  4+/5    Right Knee Extension  4+/5    Left Knee  Flexion  4+/5    Left Knee Extension  4/5                   [facility] Adult PT Treatment/Exercise - 02/16/19 0001      Lumbar Exercises: Stretches   Other Lumbar Stretch Exercise  Left QL stretch in standing 30 sec x 2 - leaning into wall      Lumbar Exercises: Aerobic   Nustep  L4 x 6 min (UE/LE)      Lumbar Exercises: Seated   Other Seated Lumbar Exercises  A/P & lateral pelvic tilts on green Pball x 10 each    Other Seated Lumbar Exercises  B hip IR seated on airex pad + adduction ball squeeze with yellow TB at ankles 2 x 10 resp       Moist Heat Therapy   Number Minutes Moist Heat  10 Minutes    Moist Heat Location  Lumbar Spine;Hip   in L sidelying with      Manual Therapy   Manual Therapy  Manual Traction;Other (comment)    Manual therapy comments  supine     Manual Traction  gentle L LE longitudinal distraction; 4 rounds of 2 min holds with increasing traction pull as pt. noting pain relief and "good stretch" from this                PT Short Term Goals - 02/08/19 0810      PT SHORT TERM GOAL #1   Title  Patient will be independent with initial HEP    Baseline  --    Status  Achieved   02/08/19     PT SHORT TERM GOAL #2   Title  Patient will verbalize/demonstrate understanding of neutral spine  posture and proper body mechanics to reduce strain on lumbar spine    Status  Achieved   02/05/19     PT SHORT TERM GOAL #3   Title  Patient will report >/=25 % reduction in LBP and/or L LE radicular pain    Baseline  7-8/10 on eval    Status  Achieved   12/09/19 - "almost 50% improvement"       PT Long Term Goals - 02/16/19 0831      PT LONG TERM GOAL #1   Title  Patient will be independent with ongoing/advanced HEP    Status  Partially Met      PT LONG TERM GOAL #2   Title  Patient to demonstrate appropriate posture and body mechanics needed for daily activities    Status  Partially Met   02/16/19:  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  Still noting some remaining LBP/hip pain with bed mobility     PT LONG TERM GOAL #3   Title  Patient will demonstrate neutral lumbopelvic alignment w/o evidence of R pelvic obliquity    Status  Partially Met   02/16/19:  Pt. demonstrating and noting improved lumbopelvic positioning in standing and walking however not yet fully neutral; still with slight pelvic obliquity     PT LONG TERM GOAL #4   Title  Patient to improve lumbar AROM to [facility] (as pregnancy permits) without pain provocation    Status  Partially Met   02/16/19:  met for R lumbar AROM rotation     PT LONG TERM GOAL #5   Title  Patient will improve B proximal LE strength to >/= 4/5 to 4+/5 for  improved core stability    Status  Partially Met            Plan - 02/16/19 0809    Clinical Impression Statement  Pt. has made good progress with physical therapy all STGs met and pt. has partially met all LTGs at this time.  Pt. reporting 80% improvement in pain levels since starting therapy.  LTG #2 partially met as pt. has adjusted her positioning with kitchen work, bending/squatting technique, and sitting posture with reduction in lumbar/hip pain noted.  Pt. lumbopelvic alignment has significantly  improved since start of physical therapy as evident with increased weight shift toward neutral with some slight R lateral shift with walking and standing.  LTG #3 partially achieved.  Noted improvement in lumbopelvic positioning today following L LE manual longitudinal traction with therapist and pelvic mobility seated on green p-ball.  Has demonstrated improved lumbar AROM partially achieving LTG #4 and met for R lumbar AROM rotation and demonstrating improved side bending AROM.  LTG #5 partially achieved with global improvement in overall proximal hip strength and remaining strength deficit in L hip IR strength with MTM at 4-/5 strength.  Pt. will continue to benefit from further skilled therapy to improved activity tolerance and functional strength.    Comorbidities  [redacted] weeks pregnant    Rehab Potential  Good    PT Treatment/Interventions  ADLs/Self Care Home Management;Cryotherapy;Moist Heat;Gait training;Functional mobility training;Therapeutic activities;Therapeutic exercise;Neuromuscular re-education;Patient/family education;Manual techniques;Passive range of motion;Dry needling;Taping;Spinal Manipulations;Joint Manipulations    PT Next Visit Plan  lumbopelvic mobility and stretching as tolerated; core and proximal LE strengthening; thermal modalities and/or taping (possible glute medius) as indicated    PT Home Exercise Plan  01/29/19 - sleeping posture, piriformis & QL/ITB stretches; 02/05/19 - standing QL/ITB stretches, hip adduction isometric, hookyling clam with yellow TB, self-STM with ball on wall for glutes/piriformis/QL    Consulted and Agree with Plan of Care  Patient       Patient will benefit from skilled therapeutic intervention in order to improve the following deficits and impairments:  Abnormal gait, Decreased activity tolerance, Decreased knowledge of precautions, Decreased mobility, Decreased range of motion, Decreased strength, Difficulty walking, Increased fascial restricitons,  Increased muscle spasms, Impaired flexibility, Postural dysfunction, Improper body mechanics, Pain  Visit Diagnosis: Acute bilateral low back pain with left-sided sciatica  Abnormal posture  Muscle weakness (generalized)  Other abnormalities of gait and mobility  Other symptoms and signs involving the musculoskeletal system     Problem List Patient Active Problem List   Diagnosis Date Noted  . Acute bilateral low back pain with left-sided sciatica 01/22/2019  . Delivery by vacuum extractor affecting fetus or newborn 09/25/2010  . SVD (spontaneous vaginal delivery) 09/24/2010    Bess Harvest, PTA 02/16/19 11:31 AM   [facility] [address]  [place], [place], [address] Phone: [phone]   Fax:  [phone]  Name: Melanie Baldwin MRN: [id number] Date of Birth: [birth year]

## 2019-02-19 ENCOUNTER — Other Ambulatory Visit: Payer: Self-pay

## 2019-02-19 ENCOUNTER — Ambulatory Visit: Payer: Medicaid Other | Admitting: Family Medicine

## 2019-02-19 ENCOUNTER — Encounter: Payer: Self-pay | Admitting: Family Medicine

## 2019-02-19 DIAGNOSIS — M5442 Lumbago with sciatica, left side: Secondary | ICD-10-CM

## 2019-02-19 NOTE — Assessment & Plan Note (Signed)
Some of the back pain and radicular symptoms have improved.  Pain seems to be localized over the left SI joint today.  Has had improvement with physical therapy. -Continue physical therapy. -Counseled on home exercise therapy and supportive care. -If pain worsens could consider an SI joint injection.

## 2019-02-19 NOTE — Progress Notes (Signed)
Melanie Baldwin - 34 y.o. female MRN [id number]  Date of birth: 1985/06/28  SUBJECTIVE:  Including CC & ROS.  No chief complaint on file.   Melanie Baldwin is a 34 y.o. female that is following up for her low back pain and left-sided radicular symptoms.  She has tried prednisone and Flexeril.  She had limited improvement with the medications.  She has had some improvement with the physical therapy.  Has less radicular symptoms.  Still has pain that seems to be localized to the left-sided SI joint.   Review of Systems See HPI   HISTORY: Past Medical, Surgical, Social, and Family History Reviewed & Updated per EMR.   Pertinent Historical Findings include:  Past Medical History:  Diagnosis Date  . Cystic fibrosis carrier   . Normal pregnancy 09/24/2010  . TB (pulmonary tuberculosis)    took med for 6 months currently negative    No past surgical history on file.  No Known Allergies  Family History  Problem Relation Age of Onset  . Diabetes Paternal Grandmother      Social History   Socioeconomic History  . Marital status: Married    Spouse name: Not on file  . Number of children: Not on file  . Years of education: Not on file  . Highest education level: Not on file  Occupational History  . Not on file  Tobacco Use  . Smoking status: Never Smoker  . Smokeless tobacco: Never Used  Substance and Sexual Activity  . Alcohol use: No  . Drug use: No  . Sexual activity: Not on file  Other Topics Concern  . Not on file  Social History Narrative  . Not on file   Social Determinants of Health   Financial Resource Strain:   . Difficulty of Paying Living Expenses: Not on file  Food Insecurity:   . Worried About Programme researcher, broadcasting/film/video in the Last Year: Not on file  . Ran Out of Food in the Last Year: Not on file  Transportation Needs:   . Lack of Transportation (Medical): Not on file  . Lack of Transportation (Non-Medical): Not on file  Physical Activity:   . Days of  Exercise per Week: Not on file  . Minutes of Exercise per Session: Not on file  Stress:   . Feeling of Stress : Not on file  Social Connections:   . Frequency of Communication with Friends and Family: Not on file  . Frequency of Social Gatherings with Friends and Family: Not on file  . Attends Religious Services: Not on file  . Active Member of Clubs or Organizations: Not on file  . Attends Banker Meetings: Not on file  . Marital Status: Not on file  Intimate Partner Violence:   . Fear of Current or Ex-Partner: Not on file  . Emotionally Abused: Not on file  . Physically Abused: Not on file  . Sexually Abused: Not on file     PHYSICAL EXAM:  VS: BP 115/76   Pulse 94   Ht 5\' 3"  (1.6 m)   Wt 170 lb (77.1 kg)   BMI 30.11 kg/m  Physical Exam Gen: NAD, alert, cooperative with exam, well-appearing ENT: normal lips, normal nasal mucosa,  Eye: normal EOM, normal conjunctiva and lids Skin: no rashes, no areas of induration  Neuro: normal tone, normal sensation to touch Psych:  normal insight, alert and oriented MSK:  Low back: Tenderness over the SI joint of the left side. No significant pain  with extension. Normal internal and external rotation of the hip. Negative straight leg raise. Neurovascularly intact     ASSESSMENT & PLAN:   Acute bilateral low back pain with left-sided sciatica Some of the back pain and radicular symptoms have improved.  Pain seems to be localized over the left SI joint today.  Has had improvement with physical therapy. -Continue physical therapy. -Counseled on home exercise therapy and supportive care. -If pain worsens could consider an SI joint injection.

## 2019-02-19 NOTE — Patient Instructions (Signed)
Good to see you Please try alternating heat and ice Please continue with physical therapy   Please send me a message in MyChart with any questions or updates.  Please see me back in 4 weeks.   --Dr. Jordan Likes

## 2019-02-20 ENCOUNTER — Ambulatory Visit: Payer: Medicaid Other

## 2019-02-20 DIAGNOSIS — R2689 Other abnormalities of gait and mobility: Secondary | ICD-10-CM

## 2019-02-20 DIAGNOSIS — M6281 Muscle weakness (generalized): Secondary | ICD-10-CM

## 2019-02-20 DIAGNOSIS — M5442 Lumbago with sciatica, left side: Secondary | ICD-10-CM

## 2019-02-20 DIAGNOSIS — R293 Abnormal posture: Secondary | ICD-10-CM

## 2019-02-20 DIAGNOSIS — R29898 Other symptoms and signs involving the musculoskeletal system: Secondary | ICD-10-CM

## 2019-02-20 NOTE — Therapy (Signed)
[facility] [address]  [place], [place], [address] Phone: [phone]   Fax:  [phone]  Physical Therapy Treatment  Patient Details  Name: Melanie Baldwin MRN: [id number] Date of Birth: 1985-06-25 Referring Provider (PT): Clearance Coots, MD   Encounter Date: 02/20/2019  PT End of Session - 02/20/19 0809    Visit Number  7    Number of Visits  15    Date for PT Re-Evaluation  03/26/19    Authorization Type  Medicaid    Authorization Time Period  02/14/19 - 03/27/19    Authorization - Visit Number  3    Authorization - Number of Visits  12    PT Start Time  0800    PT Stop Time  0626   Ended visit with 10 min moist heat   PT Time Calculation (min)  55 min    Activity Tolerance  Patient tolerated treatment well    Behavior During Therapy  [facility] for tasks assessed/performed       Past Medical History:  Diagnosis Date  . Cystic fibrosis carrier   . Normal pregnancy 09/24/2010  . TB (pulmonary tuberculosis)    took med for 6 months currently negative    No past surgical history on file.  There were no vitals filed for this visit.  Subjective Assessment - 02/20/19 0807    Subjective  Pt. noting increased L buttocks pain since last visit and feels it was the "bending down" movement she did that worsened her pain.    Pertinent History  [redacted] weeks pregnant    Patient Stated Goals  "I would like to feel better soon."    Currently in Pain?  Yes    Pain Score  7     Pain Location  Back    Pain Orientation  Lower;Left    Pain Descriptors / Indicators  Constant;Sharp    Pain Type  Acute pain    Pain Radiating Towards  into L buttocks    Pain Frequency  Constant    Aggravating Factors   bending down    Multiple Pain Sites  No                       [facility] Adult PT Treatment/Exercise - 02/20/19 0001      Lumbar Exercises: Stretches   Other Lumbar Stretch Exercise  Left QL stretch in standing 30 sec  x 2 - leaning into wall    Other Lumbar Stretch Exercise  L ITB/QL stretch sidelying with L LE hanging off table 3 x 30 sec with therapist manual overpressure at L shoulder +L iliac crest       Lumbar Exercises: Aerobic   Nustep  L4 x 7 min (UE/LE)      Lumbar Exercises: Standing   Other Standing Lumbar Exercises  R/L glute medius 45 dg kick back with looped yellow TB at ankles x 10      Lumbar Exercises: Supine   Bridge with clamshell  10 reps;3 seconds   partial ROM - no increased pain    Bridge with [employer] Limitations  + iso hip abd/ER into yellow TB at knees       Moist Heat Therapy   Number Minutes Moist Heat  10 Minutes    Moist Heat Location  Lumbar Spine   L lumbar spine and L QL in sidelying with LE elevated     Manual Therapy   Manual  Therapy  Manual Traction;Other (comment);Soft tissue mobilization;Passive ROM    Manual therapy comments  supine, and sidelying    Soft tissue mobilization  STM to L glute med, glute min - no ttp;  STM to L QL in sidelying - min ttp     Passive ROM  L QL stretch with manual overpressure laying R sidelying x 30 sec     Manual Traction  gentle L LE longitudinal distraction; 3 rounds of 1 min holds - pt. noting pain relief with this             PT Education - 02/20/19 0906    Education Details  HEP update; standing hip abduction kicker into yellow TB at ankles    Person(s) Educated  Patient    Methods  Explanation;Demonstration;Verbal cues;Handout    Comprehension  Verbalized understanding;Returned demonstration;Verbal cues required       PT Short Term Goals - 02/08/19 0810      PT SHORT TERM GOAL #1   Title  Patient will be independent with initial HEP    Baseline  --    Status  Achieved   02/08/19     PT SHORT TERM GOAL #2   Title  Patient will verbalize/demonstrate understanding of neutral spine posture and proper body mechanics to reduce strain on lumbar spine    Status  Achieved   02/05/19     PT SHORT TERM GOAL #3    Title  Patient will report >/=25 % reduction in LBP and/or L LE radicular pain    Baseline  7-8/10 on eval    Status  Achieved   12/09/19 - "almost 50% improvement"       PT Long Term Goals - 02/16/19 0831      PT LONG TERM GOAL #1   Title  Patient will be independent with ongoing/advanced HEP    Status  Partially Met      PT LONG TERM GOAL #2   Title  Patient to demonstrate appropriate posture and body mechanics needed for daily activities    Status  Partially Met   02/16/19:  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  Still noting some remaining LBP/hip pain with bed mobility     PT LONG TERM GOAL #3   Title  Patient will demonstrate neutral lumbopelvic alignment w/o evidence of R pelvic obliquity    Status  Partially Met   02/16/19:  Pt. demonstrating and noting improved lumbopelvic positioning in standing and walking however not yet fully neutral; still with slight pelvic obliquity     PT LONG TERM GOAL #4   Title  Patient to improve lumbar AROM to [facility] (as pregnancy permits) without pain provocation    Status  Partially Met   02/16/19:  met for R lumbar AROM rotation     PT LONG TERM GOAL #5   Title  Patient will improve B proximal LE strength to >/= 4/5 to 4+/5 for improved core stability    Status  Partially Met            Plan - 02/20/19 9767    Clinical Impression Statement  Pt. noting increased L buttocks pain and L lumbar pain since last visit.  Reports the "bending down" movement increased her pain.  Pt. with initial subjective pain rating of 7/10 which decreased to 3/10 following MT and further decreased to 0/10 after proximal hip strengthening and application of moist heat.  With  mild progression of L hip ER and buttocks strengthening activities today which were tolerated well.  Pt. encouraged to perform self STM with tennis ball on L buttocks on wall as she has not yet attempted  this at home.  Pt. reporting she is performing sidelying QL/ITB, standing glute kickback with band HEP activities most consistently at home.    Comorbidities  [redacted] weeks pregnant    Rehab Potential  Good    PT Treatment/Interventions  ADLs/Self Care Home Management;Cryotherapy;Moist Heat;Gait training;Functional mobility training;Therapeutic activities;Therapeutic exercise;Neuromuscular re-education;Patient/family education;Manual techniques;Passive range of motion;Dry needling;Taping;Spinal Manipulations;Joint Manipulations    PT Next Visit Plan  lumbopelvic mobility and stretching as tolerated; core and proximal LE strengthening; thermal modalities and/or taping (possible glute medius) as indicated    PT Home Exercise Plan  01/29/19 - sleeping posture, piriformis & QL/ITB stretches; 02/05/19 - standing QL/ITB stretches, hip adduction isometric, hookyling clam with yellow TB, self-STM with ball on wall for glutes/piriformis/QL; 02/20/19 - standing hip abduction kicker into yellow TB at ankles    Consulted and Agree with Plan of Care  Patient       Patient will benefit from skilled therapeutic intervention in order to improve the following deficits and impairments:  Abnormal gait, Decreased activity tolerance, Decreased knowledge of precautions, Decreased mobility, Decreased range of motion, Decreased strength, Difficulty walking, Increased fascial restricitons, Increased muscle spasms, Impaired flexibility, Postural dysfunction, Improper body mechanics, Pain  Visit Diagnosis: Acute bilateral low back pain with left-sided sciatica  Abnormal posture  Muscle weakness (generalized)  Other abnormalities of gait and mobility  Other symptoms and signs involving the musculoskeletal system     Problem List Patient Active Problem List   Diagnosis Date Noted  . Acute bilateral low back pain with left-sided sciatica 01/22/2019  . Delivery by vacuum extractor affecting fetus or newborn 09/25/2010  .  SVD (spontaneous vaginal delivery) 09/24/2010    Bess Harvest, PTA 02/20/19 9:08 AM    [facility] [address]  [place], [place], [address] Phone: [phone]   Fax:  [phone]  Name: Lucillie Kiesel MRN: [id number] Date of Birth: Jan 31, 1985

## 2019-02-23 ENCOUNTER — Ambulatory Visit: Payer: Medicaid Other

## 2019-02-23 ENCOUNTER — Other Ambulatory Visit: Payer: Self-pay

## 2019-02-23 DIAGNOSIS — M6281 Muscle weakness (generalized): Secondary | ICD-10-CM

## 2019-02-23 DIAGNOSIS — M5442 Lumbago with sciatica, left side: Secondary | ICD-10-CM

## 2019-02-23 DIAGNOSIS — R293 Abnormal posture: Secondary | ICD-10-CM

## 2019-02-23 DIAGNOSIS — R2689 Other abnormalities of gait and mobility: Secondary | ICD-10-CM

## 2019-02-23 DIAGNOSIS — R29898 Other symptoms and signs involving the musculoskeletal system: Secondary | ICD-10-CM

## 2019-02-23 NOTE — Therapy (Signed)
[facility] [address]  [place], [place], [address] Phone: [phone]   Fax:  [phone]  Physical Therapy Treatment  Patient Details  Name: Melanie Baldwin MRN: [id number] Date of Birth: 08-30-1985 Referring Provider (PT): Clearance Coots, MD   Encounter Date: 02/23/2019  PT End of Session - 02/23/19 0806    Visit Number  8    Number of Visits  15    Date for PT Re-Evaluation  03/26/19    Authorization Type  Medicaid    Authorization Time Period  02/14/19 - 03/27/19    Authorization - Visit Number  4    Authorization - Number of Visits  12    PT Start Time  0801    PT Stop Time  0913   Ended visit with 10 min moist heat   PT Time Calculation (min)  72 min    Activity Tolerance  Patient tolerated treatment well    Behavior During Therapy  [facility] for tasks assessed/performed       Past Medical History:  Diagnosis Date  . Cystic fibrosis carrier   . Normal pregnancy 09/24/2010  . TB (pulmonary tuberculosis)    took med for 6 months currently negative    No past surgical history on file.  There were no vitals filed for this visit.  Subjective Assessment - 02/23/19 0804    Subjective  Pt. reporting improved pain levels over last few days.    Pertinent History  [redacted] weeks pregnant    How long can you sit comfortably?  20 min    How long can you stand comfortably?  5 min    How long can you walk comfortably?  10 min    Patient Stated Goals  "I would like to feel better soon."    Currently in Pain?  Yes    Pain Score  2     Pain Location  Back    Pain Orientation  Lower;Left    Pain Descriptors / Indicators  Constant;Sharp    Pain Type  Acute pain    Pain Radiating Towards  into L buttocks    Pain Onset  More than a month ago    Pain Frequency  Constant    Aggravating Factors   bending down    Effect of Pain on Daily Activities  avoids bending down    Multiple Pain Sites  No                        [facility] Adult PT Treatment/Exercise - 02/23/19 0001      Lumbar Exercises: Stretches   Other Lumbar Stretch Exercise  L ITB/QL stretch sidelying with L LE hanging off table 3 x 30 sec with therapist manual overpressure at L shoulder +L iliac crest       Lumbar Exercises: Aerobic   Nustep  L4 x 7 min (UE/LE)      Lumbar Exercises: Standing   Functional Squats  10 reps;3 seconds    Functional Squats Limitations  at TM rail; red looped TB at knees    Other Standing Lumbar Exercises  R/L glute medius 45 dg kick back with looped yellow TB at ankles x 10    Other Standing Lumbar Exercises  B hip hike standing on 4" step x 5 reps; holding onto machine for support      Lumbar Exercises: Supine   Clam  15 reps;3 seconds    Clam Limitations  hooklying B ABD/ER with red TB    Bridge with clamshell  3 seconds   x 12 reps   Bridge with [employer] Limitations  + iso hip abd/ER into red TB at knees       Lumbar Exercises: Quadruped   Straight Leg Raise  10 reps;3 seconds    Straight Leg Raises Limitations  in quadruped       Moist Heat Therapy   Number Minutes Moist Heat  10 Minutes    Moist Heat Location  Lumbar Spine   in hooklying      Manual Therapy   Manual Therapy  Manual Traction;Soft tissue mobilization;Passive ROM;Muscle Energy Technique;Taping    Manual therapy comments  supine, and sidelying    Soft tissue mobilization  STM to L glute med; STM to L QL in sidelying - min ttp     Passive ROM  L QL stretch with manual overpressure laying R sidelying x 30 sec     Manual Traction  gentle L LE longitudinal distraction; 3 rounds of 1 min holds - pt. noting pain relief with this    Muscle Energy Technique  3 rounds QL contract/relax stretch with therepist in sidelying with L LE hanging off table and with L LE support from therapist and manual resistance at L illiac crest     Kinesiotex  Inhibit Muscle      Kinesiotix   Inhibit Muscle   K-taping along B  QL musculature: 2 I-strips (30% stretch) along length of B QL musculature              PT Education - 02/23/19 0910    Education Details  HEP update;  bridge/abduction band    Person(s) Educated  Patient    Methods  Explanation;Demonstration;Verbal cues;Handout    Comprehension  Verbalized understanding;Returned demonstration;Verbal cues required       PT Short Term Goals - 02/08/19 0810      PT SHORT TERM GOAL #1   Title  Patient will be independent with initial HEP    Baseline  --    Status  Achieved   02/08/19     PT SHORT TERM GOAL #2   Title  Patient will verbalize/demonstrate understanding of neutral spine posture and proper body mechanics to reduce strain on lumbar spine    Status  Achieved   02/05/19     PT SHORT TERM GOAL #3   Title  Patient will report >/=25 % reduction in LBP and/or L LE radicular pain    Baseline  7-8/10 on eval    Status  Achieved   12/09/19 - "almost 50% improvement"       PT Long Term Goals - 02/16/19 0831      PT LONG TERM GOAL #1   Title  Patient will be independent with ongoing/advanced HEP    Status  Partially Met      PT LONG TERM GOAL #2   Title  Patient to demonstrate appropriate posture and body mechanics needed for daily activities    Status  Partially Met   02/16/19:  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  Still noting some remaining LBP/hip pain with bed mobility     PT LONG TERM GOAL #3   Title  Patient will demonstrate neutral lumbopelvic alignment w/o evidence of R pelvic obliquity    Status  Partially Met   02/16/19:  Pt. demonstrating and noting improved lumbopelvic positioning in  standing and walking however not yet fully neutral; still with slight pelvic obliquity     PT LONG TERM GOAL #4   Title  Patient to improve lumbar AROM to [facility] (as pregnancy permits) without pain provocation    Status  Partially Met   02/16/19:  met for  R lumbar AROM rotation     PT LONG TERM GOAL #5   Title  Patient will improve B proximal LE strength to >/= 4/5 to 4+/5 for improved core stability    Status  Partially Met            Plan - 02/23/19 0808    Clinical Impression Statement  Pt. noting good pain relief since last visit.  Noted initial 2/10 L-LBP which continues to subside with MT - longitudinal distraction and L QL stretching.  Tolerated increased standing proximal hip strengthening today (functional "mini" squat), glute med hip kicker with yellow TB, B hip hiking.  MT revealing muscular tension in B QL musculature thus trialed K-taping to this area and ended session with moist heat to mid/lower back in hooklying.  Updated HEP with bridge/abduction band activity which pt. has been able to tolerate well last two sessions.  Will monitor response to K-taping and HEP update in coming session.  Progressing well toward goals noting improved standing tolerance at work.    Comorbidities  [redacted] weeks pregnant    Rehab Potential  Good    PT Treatment/Interventions  ADLs/Self Care Home Management;Cryotherapy;Moist Heat;Gait training;Functional mobility training;Therapeutic activities;Therapeutic exercise;Neuromuscular re-education;Patient/family education;Manual techniques;Passive range of motion;Dry needling;Taping;Spinal Manipulations;Joint Manipulations    PT Next Visit Plan  lumbopelvic mobility and stretching as tolerated; core and proximal LE strengthening; thermal modalities and/or taping (possible glute medius) as indicated    PT Home Exercise Plan  01/29/19 - sleeping posture, piriformis & QL/ITB stretches; 02/05/19 - standing QL/ITB stretches, hip adduction isometric, hookyling clam with yellow TB, self-STM with ball on wall for glutes/piriformis/QL; 02/20/19 - standing hip abduction kicker into yellow TB at ankles; 02/23/19 - bridge/abduction yellow band    Consulted and Agree with Plan of Care  Patient       Patient will benefit from  skilled therapeutic intervention in order to improve the following deficits and impairments:  Abnormal gait, Decreased activity tolerance, Decreased knowledge of precautions, Decreased mobility, Decreased range of motion, Decreased strength, Difficulty walking, Increased fascial restricitons, Increased muscle spasms, Impaired flexibility, Postural dysfunction, Improper body mechanics, Pain  Visit Diagnosis: Acute bilateral low back pain with left-sided sciatica  Abnormal posture  Muscle weakness (generalized)  Other abnormalities of gait and mobility  Other symptoms and signs involving the musculoskeletal system     Problem List Patient Active Problem List   Diagnosis Date Noted  . Acute bilateral low back pain with left-sided sciatica 01/22/2019  . Delivery by vacuum extractor affecting fetus or newborn 09/25/2010  . SVD (spontaneous vaginal delivery) 09/24/2010    Bess Harvest, PTA 02/23/19 9:24 AM   [facility] [address]  [place], [place], [address] Phone: [phone]   Fax:  [phone]  Name: Melanie Baldwin MRN: [id number] Date of Birth: 1985/03/25

## 2019-02-27 ENCOUNTER — Ambulatory Visit: Payer: Medicaid Other

## 2019-03-02 ENCOUNTER — Other Ambulatory Visit: Payer: Self-pay

## 2019-03-02 ENCOUNTER — Ambulatory Visit: Payer: Medicaid Other | Attending: Family Medicine

## 2019-03-02 DIAGNOSIS — M5442 Lumbago with sciatica, left side: Secondary | ICD-10-CM

## 2019-03-02 DIAGNOSIS — R293 Abnormal posture: Secondary | ICD-10-CM

## 2019-03-02 DIAGNOSIS — R29898 Other symptoms and signs involving the musculoskeletal system: Secondary | ICD-10-CM

## 2019-03-02 DIAGNOSIS — M6281 Muscle weakness (generalized): Secondary | ICD-10-CM | POA: Diagnosis present

## 2019-03-02 DIAGNOSIS — R2689 Other abnormalities of gait and mobility: Secondary | ICD-10-CM

## 2019-03-02 NOTE — Therapy (Signed)
[facility] [address]  [place], [place], [address] Phone: [phone]   Fax:  [phone]  Physical Therapy Treatment  Patient Details  Name: Melanie Baldwin MRN: [id number] Date of Birth: May 26, 1985 Referring Provider (PT): Clearance Coots, MD   Encounter Date: 03/02/2019  PT End of Session - 03/02/19 0828    Visit Number  9    Number of Visits  15    Date for PT Re-Evaluation  03/26/19    Authorization Type  Medicaid    Authorization Time Period  02/14/19 - 03/27/19    Authorization - Visit Number  5    Authorization - Number of Visits  12    PT Start Time  0801   Ended visit with 10 min moist heat   PT Stop Time  0900    PT Time Calculation (min)  59 min    Activity Tolerance  Patient tolerated treatment well    Behavior During Therapy  [facility] for tasks assessed/performed       Past Medical History:  Diagnosis Date  . Cystic fibrosis carrier   . Normal pregnancy 09/24/2010  . TB (pulmonary tuberculosis)    took med for 6 months currently negative    No past surgical history on file.  There were no vitals filed for this visit.  Subjective Assessment - 03/02/19 0810    Subjective  Pt. reporting she has not had signficant pain since last friday.    Pertinent History  [redacted] weeks pregnant    Patient Stated Goals  "I would like to feel better soon."    Currently in Pain?  No/denies    Pain Score  0-No pain    Multiple Pain Sites  No                       [facility] Adult PT Treatment/Exercise - 03/02/19 0001      Lumbar Exercises: Aerobic   Nustep  L4 x 7 min (UE/LE)      Lumbar Exercises: Supine   Clam  15 reps;3 seconds    Clam Limitations  hooklying alternating hip ABD/ER with red TB    Bridge with clamshell  15 reps;5 seconds    Bridge with [employer] Limitations  + iso hip abd/ER into red TB at knees       Lumbar Exercises: Quadruped   Straight Leg Raise  10 reps;3 seconds   cues for  abdom. bracing    Straight Leg Raises Limitations  in quadruped       Knee/Hip Exercises: Standing   Hip Flexion  Right;Left;Knee straight;Stengthening;10 reps   cues for abdom. bracing    Hip Flexion Limitations  yellow looped TB at ankles; TM rail support     Hip Abduction  Right;Left;Knee straight;Stengthening;10 reps   cues for abdom. bracing    Abduction Limitations  yellow looped TB at ankles; TM rail support     Hip Extension  Right;Left;Knee straight;10 reps;Stengthening   cues for abdom. bracing    Extension Limitations  yellow looped TB at ankles; TM rail support       Moist Heat Therapy   Number Minutes Moist Heat  10 Minutes    Moist Heat Location  Lumbar Spine   mid back      Manual Therapy   Manual Therapy  Muscle Energy Technique;Taping;Manual Traction    Manual Traction  gentle L LE longitudinal distraction; 3 rounds of 30 sec holds  Kinesiotex  Inhibit Muscle      Kinesiotix   Inhibit Muscle   K-taping along B QL musculature: 2 I-strips (30% stretch) along length of B QL musculature              PT Education - 03/02/19 0907    Education Details  HEP update; quadruped hip ext., counter squat    Person(s) Educated  Patient    Methods  Explanation;Demonstration;Verbal cues;Handout    Comprehension  Verbalized understanding;Returned demonstration;Verbal cues required       PT Short Term Goals - 02/08/19 0810      PT SHORT TERM GOAL #1   Title  Patient will be independent with initial HEP    Baseline  --    Status  Achieved   02/08/19     PT SHORT TERM GOAL #2   Title  Patient will verbalize/demonstrate understanding of neutral spine posture and proper body mechanics to reduce strain on lumbar spine    Status  Achieved   02/05/19     PT SHORT TERM GOAL #3   Title  Patient will report >/= 25% reduction in LBP and/or L LE radicular pain    Baseline  7-8/10 on eval    Status  Achieved   02/08/19 - "almost 50% improvement"       PT Long Term  Goals - 02/16/19 0831      PT LONG TERM GOAL #1   Title  Patient will be independent with ongoing/advanced HEP    Status  Partially Met      PT LONG TERM GOAL #2   Title  Patient to demonstrate appropriate posture and body mechanics needed for daily activities    Status  Partially Met   02/16/19:  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  Still noting some remaining LBP/hip pain with bed mobility     PT LONG TERM GOAL #3   Title  Patient will demonstrate neutral lumbopelvic alignment w/o evidence of R pelvic obliquity    Status  Partially Met   02/16/19:  Pt. demonstrating and noting improved lumbopelvic positioning in standing and walking however not yet fully neutral; still with slight pelvic obliquity     PT LONG TERM GOAL #4   Title  Patient to improve lumbar AROM to [facility] (as pregnancy permits) without pain provocation    Status  Partially Met   02/16/19:  met for R lumbar AROM rotation     PT LONG TERM GOAL #5   Title  Patient will improve B proximal LE strength to >/= 4/5 to 4+/5 for improved core stability    Status  Partially Met            Plan - 03/02/19 0910    Clinical Impression Statement  Pt. reporting she has not had significant pain since last Friday.  Notes she has had good benefit from QL K-taping applied last session which lasted 3 days and she feels improved her tolerance for standing at work.  Session focused more on proximal hip strengthening in standing which was tolerated well.  SI alignment appeared normal today.  Ended session with re-application of lumbar taping and moist heat as pt. noting good benefit from this modality.  Will plan for 10th visit PN and addressing goals at upcoming session.    Comorbidities  [redacted] weeks pregnant    Rehab Potential  Good    PT Treatment/Interventions  ADLs/Self Care Home  Management;Cryotherapy;Moist Heat;Gait training;Functional mobility  training;Therapeutic activities;Therapeutic exercise;Neuromuscular re-education;Patient/family education;Manual techniques;Passive range of motion;Dry needling;Taping;Spinal Manipulations;Joint Manipulations    PT Next Visit Plan  Lumbopelvic mobility and stretching as tolerated; core and proximal LE strengthening; thermal modalities and/or taping (possible glute medius) as indicated    PT Home Exercise Plan  01/29/19 - sleeping posture, piriformis & QL/ITB stretches; 02/05/19 - standing QL/ITB stretches, hip adduction isometric, hookyling clam with yellow TB, self-STM with ball on wall for glutes/piriformis/QL; 02/20/19 - standing hip abduction kicker into yellow TB at ankles; 02/23/19 - bridge/abduction yellow band    Consulted and Agree with Plan of Care  Patient       Patient will benefit from skilled therapeutic intervention in order to improve the following deficits and impairments:  Abnormal gait, Decreased activity tolerance, Decreased knowledge of precautions, Decreased mobility, Decreased range of motion, Decreased strength, Difficulty walking, Increased fascial restricitons, Increased muscle spasms, Impaired flexibility, Postural dysfunction, Improper body mechanics, Pain  Visit Diagnosis: Acute bilateral low back pain with left-sided sciatica  Abnormal posture  Muscle weakness (generalized)  Other abnormalities of gait and mobility  Other symptoms and signs involving the musculoskeletal system     Problem List Patient Active Problem List   Diagnosis Date Noted  . Acute bilateral low back pain with left-sided sciatica 01/22/2019  . Delivery by vacuum extractor affecting fetus or newborn 09/25/2010  . SVD (spontaneous vaginal delivery) 09/24/2010    Bess Harvest, PTA 03/02/19 12:14 PM   [facility] [address]  [place], [place], [address] Phone: [phone]   Fax:  [phone]  Name: Melanie Baldwin MRN:  [id number] Date of Birth: 24-Dec-1985

## 2019-03-06 ENCOUNTER — Ambulatory Visit: Payer: Medicaid Other

## 2019-03-06 ENCOUNTER — Other Ambulatory Visit: Payer: Self-pay

## 2019-03-06 DIAGNOSIS — R29898 Other symptoms and signs involving the musculoskeletal system: Secondary | ICD-10-CM

## 2019-03-06 DIAGNOSIS — M6281 Muscle weakness (generalized): Secondary | ICD-10-CM

## 2019-03-06 DIAGNOSIS — M5442 Lumbago with sciatica, left side: Secondary | ICD-10-CM | POA: Diagnosis not present

## 2019-03-06 DIAGNOSIS — R293 Abnormal posture: Secondary | ICD-10-CM

## 2019-03-06 DIAGNOSIS — R2689 Other abnormalities of gait and mobility: Secondary | ICD-10-CM

## 2019-03-06 NOTE — Therapy (Signed)
[facility] [address]  [place], [place], [address] Phone: [phone]   Fax:  [phone]  Physical Therapy Treatment  Patient Details  Name: Melanie Baldwin MRN: [id number] Date of Birth: [birth year] Referring Provider (PT): Clearance Coots, MD   Encounter Date: 03/06/2019  PT End of Session - 03/06/19 0806    Visit Number  10    Number of Visits  15    Date for PT Re-Evaluation  03/26/19    Authorization Type  Medicaid    Authorization Time Period  02/14/19 - 03/27/19    Authorization - Visit Number  6    Authorization - Number of Visits  12    PT Start Time  0801    PT Stop Time  0932    PT Time Calculation (min)  53 min    Activity Tolerance  Patient tolerated treatment well    Behavior During Therapy  [facility] for tasks assessed/performed       Past Medical History:  Diagnosis Date  . Cystic fibrosis carrier   . Normal pregnancy 09/24/2010  . TB (pulmonary tuberculosis)    took med for 6 months currently negative    No past surgical history on file.  There were no vitals filed for this visit.  Subjective Assessment - 03/06/19 0804    Subjective  Pt. reporting she has not had pain in a week and a half.    Pertinent History  [redacted] weeks pregnant    How long can you sit comfortably?  1 hour    How long can you stand comfortably?  30 min    How long can you walk comfortably?  30 min    Patient Stated Goals  "I would like to feel better soon."    Currently in Pain?  No/denies    Pain Score  0-No pain    Pain Location  Back    Multiple Pain Sites  No         [facility] PT Assessment - 03/06/19 0001      AROM   AROM Assessment Site  Lumbar    Lumbar Flexion  hands to knees - terminated due to stomach "pressure"    Lumbar Extension  40% limited     Lumbar - Right Side Bend  hand just below lateral knee     Lumbar - Left Side Bend  hand just below lateral knee    Lumbar - Right Rotation  WFL    Lumbar - Left  Rotation  [facility]      Strength   Strength Assessment Site  Hip;Knee    Right/Left Hip  Right;Left    Right Hip Flexion  4/5    Right Hip Extension  4+/5   sidelying    Right Hip External Rotation   4+/5    Right Hip Internal Rotation  4+/5    Right Hip ABduction  4+/5    Right Hip ADduction  4+/5    Left Hip Flexion  4/5    Left Hip Extension  4/5   sidelying    Left Hip External Rotation  4-/5    Left Hip Internal Rotation  4/5    Left Hip ABduction  4/5    Left Hip ADduction  4+/5    Right/Left Knee  Right;Left    Right Knee Flexion  4+/5    Right Knee Extension  4+/5    Left Knee Flexion  4+/5    Left Knee  Extension  4+/5                   [facility] Adult PT Treatment/Exercise - 03/06/19 0001      Self-Care   Self-Care  Other Self-Care Comments    Other Self-Care Comments   discussion of posture and body mechanics to check if pt. has made any adjustments at home with daily activities or chores;  pt. has begun squatting more and not bending with straight legs to avoid strain; pt. has adjusted standing in kitchen positioning to include staggered stance at counter;  reviewed comprehensive HEP to check for appropriateness;  updated HEP to include red looped band for bridge/abd, sidelying L clam shell with yellow TB;  removed a few of the previously issued HEP activities see pt. education section       Lumbar Exercises: Stretches   Figure 4 Stretch  1 rep;30 seconds    Figure 4 Stretch Limitations  figure-4 B with forward trunk lean       Lumbar Exercises: Aerobic   Nustep  L4 x 6 min (UE/LE)      Lumbar Exercises: Standing   Functional Squats  10 reps;3 seconds   instructed pt. to avoid excessive depth - pain free   Functional Squats Limitations  at TM rail; red looped TB at knees    Row  Both;10 reps;Strengthening;Theraband    Theraband Level (Row)  Level 2 (Red)    Row Limitations  cues provided for abdom. bracing, neutral trunk position, scapular  retraction/depression       Lumbar Exercises: Supine   Bridge with clamshell  15 reps;5 seconds    Bridge with [employer] Limitations  + iso hip abd/ER into red TB at knees       Lumbar Exercises: Sidelying   Clam  Left;10 reps;3 seconds    Clam Limitations  yellow TB at knees       Lumbar Exercises: Quadruped   Straight Leg Raise  10 reps;3 seconds    Straight Leg Raises Limitations  in quadruped       Knee/Hip Exercises: Standing   Hip Flexion  Right;Left;Knee straight;Stengthening;10 reps    Hip Flexion Limitations  yellow looped TB at ankles; B UE ski poles    Hip Abduction  Right;Left;Knee straight;Stengthening;10 reps    Abduction Limitations  yellow looped TB at ankles; B UE ski poles     Hip Extension  Right;Left;Knee straight;10 reps;Stengthening   cues provided for abdom. bracing    Extension Limitations  yellow looped TB at ankles; B UE ski poles              PT Education - 03/06/19 0854    Education Details  HEP update;   clam shell with yellow TB; pt. issued red TB for bridge/abd;  instructed pt. to stop performing standing QL stretch, supine KTOS stretch (pt. prefers sitting version), supine hip adduction squeeze    Person(s) Educated  Patient    Methods  Explanation;Demonstration;Verbal cues;Handout    Comprehension  Verbalized understanding;Returned demonstration;Verbal cues required       PT Short Term Goals - 02/08/19 0810      PT SHORT TERM GOAL #1   Title  Patient will be independent with initial HEP    Baseline  --    Status  Achieved   02/08/19     PT SHORT TERM GOAL #2   Title  Patient will verbalize/demonstrate understanding of neutral spine posture and proper body mechanics to reduce  strain on lumbar spine    Status  Achieved   02/05/19     PT SHORT TERM GOAL #3   Title  Patient will report >/= 25% reduction in LBP and/or L LE radicular pain    Baseline  7-8/10 on eval    Status  Achieved   02/08/19 - "almost 50% improvement"        PT Long Term Goals - 03/06/19 0807      PT LONG TERM GOAL #1   Title  Patient will be independent with ongoing/advanced HEP    Status  Partially Met      PT LONG TERM GOAL #2   Title  Patient to demonstrate appropriate posture and body mechanics needed for daily activities    Status  Achieved   03/06/19: has made adjustments to standing posture in kitchen and lifting technique to avoid excesive bending along with turning over and sitting up from bed to reduce lower back strain     PT LONG TERM GOAL #3   Title  Patient will demonstrate neutral lumbopelvic alignment w/o evidence of R pelvic obliquity    Status  Achieved   03/06/19     PT LONG TERM GOAL #4   Title  Patient to improve lumbar AROM to [facility] (as pregnancy permits) without pain provocation    Status  Partially Met   03/06/19:  met for R lumbar AROM rotation, and side bending; remaining limitation due to reported "pressure" in abdomen with flexion, ext.     PT LONG TERM GOAL #5   Title  Patient will improve B proximal LE strength to >/= 4/5 to 4+/5 for improved core stability    Status  Partially Met   03/06/19: achieved with exception of L hip ER still at 4-/5           Plan - 03/06/19 0806    Clinical Impression Statement  Pt. has made good progress with physical therapy.  Notes she has not felt pain since last Friday.  Feels she is 90% improved since starting therapy and notes she can feel remaining weakness in L LE.  Pt. has achieved all STGs and either achieved or partially achieved all LTGs.  Pt. able to partially achieve LTG #5 meeting strength goal with MMT with exception of L hip ER strength still 4-/5 .  Reviewed comprehensive HEP today to consolidate activities to focus more on strengthening remaining deficits at proximal hips.  Pt. verbalized understanding of consolidated HEP and notes daily performance of HEP.  LTG #1 partially achieved.  Pt. reports making changes to her standing posture in kitchen (with  staggered stance/LE elevated on lower cabinet), bed mobility mechanics, and lifting mechanics to avoid excessive lumbar strain.  LTG #2 achieved.  Pt. able to demonstrate neutral pelvic alignment for last two visits along with normalized walking mechanics.  LTG #3 achieved.  Pt. lumbar AROM now pain free and WFL for B lateral trunk flexion, rotation and with remaining limitation into lumbar extension/flexion with report of abdominal "pressure" as pt. [redacted] weeks pregnant.  LTG #4 partially achieved.  Tolerated all lumbopelvic/scapular strengthening activities today well.  HEP updated (see pt. education section).  Pt. will continue to benefit from further skilled therapy to improve functional strength for improved lumbopelvic stability and activity tolerance.    Comorbidities  [redacted] weeks pregnant    Rehab Potential  Good    PT Treatment/Interventions  ADLs/Self Care Home Management;Cryotherapy;Moist Heat;Gait training;Functional mobility training;Therapeutic activities;Therapeutic exercise;Neuromuscular re-education;Patient/family education;Manual techniques;Passive range  of motion;Dry needling;Taping;Spinal Manipulations;Joint Manipulations    PT Next Visit Plan  L hip ER strengthening; check tolerance for HEP; K-taping, moist heat as needed (pt. noting good benefit from both esp taping to B QL)    PT Home Exercise Plan  01/29/19 - sleeping posture, piriformis & QL/ITB stretches; 02/05/19 - standing QL/ITB stretches, hip adduction isometric, hookyling clam with yellow TB, self-STM with ball on wall for glutes/piriformis/QL; 02/20/19 - standing hip abduction kicker into yellow TB at ankles; 02/23/19 - bridge/abduction yellow band; 03/01/19 - counter squat, quadruped hip ext;  03/06/19 - clam shell with yellow TB; pt. issued red TB for bridge/abd; instructed pt. to stop performing standing QL stretch, supine KTOS stretch (pt. prefers sitting version), supine hip adduction squeeze    Consulted and Agree with Plan of Care   Patient       Patient will benefit from skilled therapeutic intervention in order to improve the following deficits and impairments:  Abnormal gait, Decreased activity tolerance, Decreased knowledge of precautions, Decreased mobility, Decreased range of motion, Decreased strength, Difficulty walking, Increased fascial restricitons, Increased muscle spasms, Impaired flexibility, Postural dysfunction, Improper body mechanics, Pain  Visit Diagnosis: Acute bilateral low back pain with left-sided sciatica  Abnormal posture  Muscle weakness (generalized)  Other abnormalities of gait and mobility  Other symptoms and signs involving the musculoskeletal system     Problem List Patient Active Problem List   Diagnosis Date Noted  . Acute bilateral low back pain with left-sided sciatica 01/22/2019  . Delivery by vacuum extractor affecting fetus or newborn 09/25/2010  . SVD (spontaneous vaginal delivery) 09/24/2010    Bess Harvest, PTA 03/06/19 9:23 AM   [facility] [address]  [place], [place], [address] Phone: [phone]   Fax:  [phone]  Name: Angelisse Riso MRN: [id number] Date of Birth: March 05, 1985

## 2019-03-09 ENCOUNTER — Ambulatory Visit: Payer: Medicaid Other

## 2019-03-13 ENCOUNTER — Encounter: Payer: Self-pay | Admitting: Physical Therapy

## 2019-03-13 ENCOUNTER — Ambulatory Visit: Payer: Medicaid Other | Admitting: Physical Therapy

## 2019-03-13 ENCOUNTER — Other Ambulatory Visit: Payer: Self-pay

## 2019-03-13 DIAGNOSIS — R2689 Other abnormalities of gait and mobility: Secondary | ICD-10-CM

## 2019-03-13 DIAGNOSIS — M6281 Muscle weakness (generalized): Secondary | ICD-10-CM

## 2019-03-13 DIAGNOSIS — M5442 Lumbago with sciatica, left side: Secondary | ICD-10-CM | POA: Diagnosis not present

## 2019-03-13 DIAGNOSIS — R29898 Other symptoms and signs involving the musculoskeletal system: Secondary | ICD-10-CM

## 2019-03-13 DIAGNOSIS — R293 Abnormal posture: Secondary | ICD-10-CM

## 2019-03-13 NOTE — Therapy (Signed)
[facility] [address]  [place], [place], [address] Phone: [phone]   Fax:  [phone]  Physical Therapy Treatment  Patient Details  Name: Melanie Baldwin MRN: [id number] Date of Birth: 04-28-1985 Referring Provider (PT): Clearance Coots, MD   Encounter Date: 03/13/2019  PT End of Session - 03/13/19 0806    Visit Number  11    Number of Visits  16    Date for PT Re-Evaluation  03/26/19    Authorization Type  Medicaid    Authorization Time Period  02/14/19 - 03/27/19    Authorization - Visit Number  7    Authorization - Number of Visits  12    PT Start Time  0806    PT Stop Time  0846    PT Time Calculation (min)  40 min    Activity Tolerance  Patient tolerated treatment well    Behavior During Therapy  [facility] for tasks assessed/performed       Past Medical History:  Diagnosis Date  . Cystic fibrosis carrier   . Normal pregnancy 09/24/2010  . TB (pulmonary tuberculosis)    took med for 6 months currently negative    History reviewed. No pertinent surgical history.  There were no vitals filed for this visit.  Subjective Assessment - 03/13/19 0808    Subjective  Pt w/o complaints this morning - remains pain free.    Pertinent History  [redacted] weeks pregnant    Patient Stated Goals  "I would like to feel better soon."    Currently in Pain?  No/denies                       [facility] Adult PT Treatment/Exercise - 03/13/19 0806      Exercises   Exercises  Lumbar      Lumbar Exercises: Aerobic   Nustep  L4 x 6 min (UE/LE)      Lumbar Exercises: Supine   Bridge with clamshell  10 reps;5 seconds    Bridge with [employer] Limitations  + alt red TB hip ABD/ER       Lumbar Exercises: Sidelying   Clam  Left;15 reps;3 seconds    Clam Limitations  yellow TB     Other Sidelying Lumbar Exercises  R/L side plank elbow to knee 5 x 5"      Lumbar Exercises: Quadruped   Other Quadruped Lumbar Exercises   R/L fire hydrants with yellow TB 10 x 3"      Knee/Hip Exercises: Standing   Hip Flexion  Right;Left;10 reps;Knee bent;Stengthening;2 sets    Hip Flexion Limitations  looped yellow TB at ankles; UE support on back of chair    Hip Abduction  Right;Left;10 reps;Knee straight;Stengthening    Abduction Limitations  looped yellow TB at ankles - cues to avoid hip ER; UE support on back of chair    Hip Extension  Right;Left;10 reps;Knee straight;Stengthening    Extension Limitations  looped yellow TB at ankles - cues to avoid hip ER; UE support on back of chair               PT Short Term Goals - 02/08/19 0810      PT SHORT TERM GOAL #1   Title  Patient will be independent with initial HEP    Baseline  --    Status  Achieved   02/08/19     PT SHORT TERM GOAL #2   Title  Patient will verbalize/demonstrate understanding of neutral spine posture and proper body mechanics to reduce strain on lumbar spine    Status  Achieved   02/05/19     PT SHORT TERM GOAL #3   Title  Patient will report >/= 25% reduction in LBP and/or L LE radicular pain    Baseline  7-8/10 on eval    Status  Achieved   02/08/19 - "almost 50% improvement"       PT Long Term Goals - 03/06/19 0807      PT LONG TERM GOAL #1   Title  Patient will be independent with ongoing/advanced HEP    Status  Partially Met      PT LONG TERM GOAL #2   Title  Patient to demonstrate appropriate posture and body mechanics needed for daily activities    Status  Achieved   03/06/19: has made adjustments to standing posture in kitchen and lifting technique to avoid excesive bending along with turning over and sitting up from bed to reduce lower back strain     PT LONG TERM GOAL #3   Title  Patient will demonstrate neutral lumbopelvic alignment w/o evidence of R pelvic obliquity    Status  Achieved   03/06/19     PT LONG TERM GOAL #4   Title  Patient to improve lumbar AROM to [facility] (as pregnancy permits) without pain provocation     Status  Partially Met   03/06/19:  met for R lumbar AROM rotation, and side bending; remaining limitation due to reported "pressure" in abdomen with flexion, ext.     PT LONG TERM GOAL #5   Title  Patient will improve B proximal LE strength to >/= 4/5 to 4+/5 for improved core stability    Status  Partially Met   03/06/19: achieved with exception of L hip ER still at 4-/5           Plan - 03/13/19 0810    Clinical Impression Statement  Melanie Baldwin remains pain free with no evidence of pelvic obliquity or SIJ malalignment today. She reports no issues with current HEP including latest addition of yellow TB side lying clams, therefore continued strengthening exercises targeting areas of continued weakness identified with MMT last visit with slight progression of complexity - exercises well tolerated without any c/o pain but some fatigue noted especially in quadruped position.    Comorbidities  [redacted] weeks pregnant    Rehab Potential  Good    PT Frequency  2x / week    PT Duration  8 weeks    PT Treatment/Interventions  ADLs/Self Care Home Management;Cryotherapy;Moist Heat;Gait training;Functional mobility training;Therapeutic activities;Therapeutic exercise;Neuromuscular re-education;Patient/family education;Manual techniques;Passive range of motion;Dry needling;Taping;Spinal Manipulations;Joint Manipulations    PT Next Visit Plan  MD progress note for appt 03/19/19; L hip strengthening (esp ER); K-taping & moist heat as needed (pt noting good benefit from both esp taping to B QL)    PT Home Exercise Plan  01/29/19 - sleeping posture, piriformis & QL/ITB stretches; 02/05/19 - standing QL/ITB stretches, hip adduction isometric, hookyling clam with yellow TB, self-STM with ball on wall for glutes/piriformis/QL; 02/20/19 - standing hip abduction kicker into yellow TB at ankles; 02/23/19 - bridge/abduction yellow band; 03/01/19 - counter squat, quadruped hip ext;  03/06/19 - clam shell with yellow TB; pt.  issued red TB for bridge/abd; instructed pt. to stop performing standing QL stretch, supine KTOS stretch (pt. prefers sitting version), supine hip adduction squeeze    Consulted and Agree with Plan  of Care  Patient       Patient will benefit from skilled therapeutic intervention in order to improve the following deficits and impairments:  Abnormal gait, Decreased activity tolerance, Decreased knowledge of precautions, Decreased mobility, Decreased range of motion, Decreased strength, Difficulty walking, Increased fascial restricitons, Increased muscle spasms, Impaired flexibility, Postural dysfunction, Improper body mechanics, Pain  Visit Diagnosis: Acute bilateral low back pain with left-sided sciatica  Abnormal posture  Muscle weakness (generalized)  Other abnormalities of gait and mobility  Other symptoms and signs involving the musculoskeletal system     Problem List Patient Active Problem List   Diagnosis Date Noted  . Acute bilateral low back pain with left-sided sciatica 01/22/2019  . Delivery by vacuum extractor affecting fetus or newborn 09/25/2010  . SVD (spontaneous vaginal delivery) 09/24/2010    Percival Spanish, PT, MPT 03/13/2019, 10:00 AM  [facility] [address]  [place], [place], [address] Phone: [phone]   Fax:  [phone]  Name: Melanie Baldwin MRN: [id number] Date of Birth: [birth year]

## 2019-03-19 ENCOUNTER — Ambulatory Visit: Payer: Medicaid Other | Admitting: Family Medicine

## 2019-03-20 ENCOUNTER — Ambulatory Visit: Payer: Medicaid Other

## 2019-03-20 ENCOUNTER — Other Ambulatory Visit: Payer: Self-pay

## 2019-03-20 DIAGNOSIS — M5442 Lumbago with sciatica, left side: Secondary | ICD-10-CM

## 2019-03-20 DIAGNOSIS — R293 Abnormal posture: Secondary | ICD-10-CM

## 2019-03-20 DIAGNOSIS — R29898 Other symptoms and signs involving the musculoskeletal system: Secondary | ICD-10-CM

## 2019-03-20 DIAGNOSIS — R2689 Other abnormalities of gait and mobility: Secondary | ICD-10-CM

## 2019-03-20 DIAGNOSIS — M6281 Muscle weakness (generalized): Secondary | ICD-10-CM

## 2019-03-20 NOTE — Therapy (Addendum)
[facility] [address]  [place], [place], [address] Phone: [phone]   Fax:  [phone]  Physical Therapy Treatment / Discharge Summary  Patient Details  Name: Melanie Baldwin MRN: [id number] Date of Birth: 1986-01-08 Referring Provider (PT): Clearance Coots, MD   Encounter Date: 03/20/2019  PT End of Session - 03/20/19 0809    Visit Number  12    Number of Visits  16    Date for PT Re-Evaluation  03/26/19    Authorization Type  Medicaid    Authorization Time Period  02/14/19 - 03/27/19    Authorization - Visit Number  8    Authorization - Number of Visits  12    PT Start Time  0802    PT Stop Time  0846    PT Time Calculation (min)  44 min    Activity Tolerance  Patient tolerated treatment well    Behavior During Therapy  [facility] for tasks assessed/performed       Past Medical History:  Diagnosis Date  . Cystic fibrosis carrier   . Normal pregnancy 09/24/2010  . TB (pulmonary tuberculosis)    took med for 6 months currently negative    No past surgical history on file.  There were no vitals filed for this visit.  Subjective Assessment - 03/20/19 0808    Subjective  Pt. doing well todya with no new complaints.  denies recent pain.    Pertinent History  [redacted] weeks pregnant    Patient Stated Goals  "I would like to feel better soon."    Currently in Pain?  No/denies    Pain Score  0-No pain    Multiple Pain Sites  No         [facility] PT Assessment - 03/20/19 0001      AROM   AROM Assessment Site  Lumbar    Lumbar Flexion  hands to just below knees - terminated due to stomach "pressure"    Lumbar Extension  40% limited     Lumbar - Right Side Bend  hand just below lateral knee     Lumbar - Left Side Bend  hand just below lateral knee    Lumbar - Right Rotation  WFL    Lumbar - Left Rotation  [facility]      Strength   Strength Assessment Site  Hip;Knee    Right/Left Hip  Right;Left    Right Hip Flexion   4+/5    Right Hip Extension  4+/5    Right Hip External Rotation   4+/5    Right Hip Internal Rotation  4+/5    Right Hip ABduction  4+/5    Right Hip ADduction  4+/5    Left Hip Flexion  4+/5    Left Hip Extension  4/5    Left Hip External Rotation  4-/5    Left Hip Internal Rotation  4+/5    Left Hip ABduction  4/5    Left Hip ADduction  4+/5    Right/Left Knee  Right;Left    Right Knee Flexion  4+/5    Right Knee Extension  4+/5    Left Knee Flexion  4+/5    Left Knee Extension  4+/5                   [facility] Adult PT Treatment/Exercise - 03/20/19 0001      Self-Care   Self-Care  Other Self-Care Comments    Other  Self-Care Comments   reviewed comprehensive HEP to check for understanding and need for update;  updated HEP with quadruped alternating "fire hydrant"; pt. still feeling chalanged with supine alternating clam shell (red band), bridge (red band at knees), sidelying LE off table ITB stretch, supine KTOS, squat at counter, quadruped alternating LE kick; notes these particular exercises she is performing daily      Lumbar Exercises: Aerobic   Nustep  L4 x 6 min (UE/LE)      Lumbar Exercises: Standing   Functional Squats  10 reps;3 seconds    Functional Squats Limitations  counter with red looped TB at knees       Lumbar Exercises: Supine   Bridge with clamshell  10 reps;5 seconds    Bridge with [employer] Limitations  + alt red TB hip ABD/ER       Lumbar Exercises: Sidelying   Clam  Left;15 reps;3 seconds    Clam Limitations  yellow TB              PT Education - 03/20/19 1259    Education Details  HEP update;  quadruped fire hydrant;  comprehensive HEP pt. is currently feeling benefit from and instructed to continue: quadruped LE kick, bridge (red band), sidelying clam shell (yellow), supine clam shell (red band), supine KTOS, sidelying ITB stretch LE off table    Person(s) Educated  Patient    Methods  Explanation;Demonstration;Verbal  cues;Handout    Comprehension  Verbalized understanding;Returned demonstration;Verbal cues required       PT Short Term Goals - 02/08/19 0810      PT SHORT TERM GOAL #1   Title  Patient will be independent with initial HEP    Baseline  --    Status  Achieved   02/08/19     PT SHORT TERM GOAL #2   Title  Patient will verbalize/demonstrate understanding of neutral spine posture and proper body mechanics to reduce strain on lumbar spine    Status  Achieved   02/05/19     PT SHORT TERM GOAL #3   Title  Patient will report >/= 25% reduction in LBP and/or L LE radicular pain    Baseline  7-8/10 on eval    Status  Achieved   02/08/19 - "almost 50% improvement"       PT Long Term Goals - 03/20/19 0835      PT LONG TERM GOAL #1   Title  Patient will be independent with ongoing/advanced HEP    Status  Partially Met      PT LONG TERM GOAL #2   Title  Patient to demonstrate appropriate posture and body mechanics needed for daily activities    Status  Achieved   03/20/19: Continues to feel that she has made adjustments to standing posture in kitchen and lifting technique to avoid excesive bending to avoid excessive lumbar strain     PT LONG TERM GOAL #3   Title  Patient will demonstrate neutral lumbopelvic alignment w/o evidence of R pelvic obliquity    Status  Achieved   03/06/19     PT LONG TERM GOAL #4   Title  Patient to improve lumbar AROM to [facility] (as pregnancy permits) without pain provocation    Status  Partially Met   03/20/19:  met for B lumbar AROM rotation and side bending; remaining limitation with flexion, ext. - notes limitation due to "pressure     PT LONG TERM GOAL #5   Title  Patient will  improve B proximal LE strength to >/= 4/5 to 4+/5 for improved core stability    Status  Partially Met   03/20/19: achieved with exception of L hip ER still at 4-/5           Plan - 03/20/19 0811    Clinical Impression Statement  Pt. has made good progress with physical  therapy.  Ambulating into clinic with normalized gait mechanics and reports she has been pain free for ~ 2 weeks.  Pt. made aware she is approaching end of current POC and feels ready to transition to home program following next visit.  Supervising PT approving this plan thus will plan on final HEP review and goal testing at upcoming session.  Pt. has achieved LTG #2, #3 - demonstrating/verbalizing awareness of good posture and body mechanics with daily activities and able to demo neutral lumbopelvic alignment.  Pt. has partially achieved LTG #4 - WFL with B lumbar AROM rotation and side bending, now only limited in lumbar flexion, extension ROM due to feeling of "pressure".  Pt. has partially achieved LTG #5 now demonstrating grossly 4/5-4+/5 LE strength with exception of L hip ER strength 4-/5.  Session focused on L hip ER strengthening and tolerated well without pain.  Pt. to see MD next week for f/u.    Comorbidities  [redacted] weeks pregnant    Rehab Potential  Good    PT Treatment/Interventions  ADLs/Self Care Home Management;Cryotherapy;Moist Heat;Gait training;Functional mobility training;Therapeutic activities;Therapeutic exercise;Neuromuscular re-education;Patient/family education;Manual techniques;Passive range of motion;Dry needling;Taping;Spinal Manipulations;Joint Manipulations    PT Next Visit Plan  L hip strengthening (esp ER); K-taping & moist heat as needed (pt noting good benefit from both esp taping to B QL)    PT Home Exercise Plan  01/29/19 - sleeping posture, piriformis & QL/ITB stretches; 02/05/19 - standing QL/ITB stretches, hip adduction isometric, hookyling clam with yellow TB, self-STM with ball on wall for glutes/piriformis/QL; 02/20/19 - standing hip abduction kicker into yellow TB at ankles; 02/23/19 - bridge/abduction yellow band; 03/01/19 - counter squat, quadruped hip ext;  03/06/19 - clam shell with yellow TB; pt. issued red TB for bridge/abd; instructed pt. to stop performing standing QL  stretch, supine KTOS stretch (pt. prefers sitting version), supine hip adduction squeeze; 03/20/19 - quadruped fire hydrant    Consulted and Agree with Plan of Care  Patient       Patient will benefit from skilled therapeutic intervention in order to improve the following deficits and impairments:  Abnormal gait, Decreased activity tolerance, Decreased knowledge of precautions, Decreased mobility, Decreased range of motion, Decreased strength, Difficulty walking, Increased fascial restricitons, Increased muscle spasms, Impaired flexibility, Postural dysfunction, Improper body mechanics, Pain  Visit Diagnosis: Acute bilateral low back pain with left-sided sciatica  Abnormal posture  Muscle weakness (generalized)  Other abnormalities of gait and mobility  Other symptoms and signs involving the musculoskeletal system     Problem List Patient Active Problem List   Diagnosis Date Noted  . Acute bilateral low back pain with left-sided sciatica 01/22/2019  . Delivery by vacuum extractor affecting fetus or newborn 09/25/2010  . SVD (spontaneous vaginal delivery) 09/24/2010    Bess Harvest, PTA 03/20/19 1:12 PM   [facility] [address]  [place], [place], [address] Phone: [phone]   Fax:  [phone]  Name: Melanie Baldwin MRN: [id number] Date of Birth: 26-Sep-1985  PHYSICAL THERAPY DISCHARGE SUMMARY  Visits from Start of Care: 12  Current functional level related  to goals / functional outcomes:   Refer to above clinical impression for status as of last visit on 03/20/2019. Patient was anticipated to be discharged from PT as of next PT visit, however patient no showed for appointment. As Medicaid authorization has now expired, she is unable to reschedule discharge assessment, therefore will proceed with discharge from PT for this episode.   Remaining deficits:   As above.   Education / Equipment:   HEP   Plan: Patient agrees to discharge.  Patient goals were partially met. Patient is being discharged due to not returning since the last visit.  ?????     Percival Spanish, PT, MPT 04/06/19, 9:58 AM  [facility] [address]  [place], [place], [address] Phone: [phone]   Fax:  [phone]

## 2019-03-23 ENCOUNTER — Encounter: Payer: Medicaid Other | Admitting: Physical Therapy

## 2019-03-26 ENCOUNTER — Ambulatory Visit: Payer: Medicaid Other | Admitting: Family Medicine

## 2019-03-26 LAB — OB RESULTS CONSOLE GBS: GBS: NEGATIVE

## 2019-03-26 NOTE — Progress Notes (Deleted)
  Eltha Tingley - 34 y.o. female MRN [id number]  Date of birth: 1985-10-31  SUBJECTIVE:  Including CC & ROS.  No chief complaint on file.   Aairah Negrette is a 34 y.o. female that is  ***.  ***   Review of Systems See HPI   HISTORY: Past Medical, Surgical, Social, and Family History Reviewed & Updated per EMR.   Pertinent Historical Findings include:  Past Medical History:  Diagnosis Date  . Cystic fibrosis carrier   . Normal pregnancy 09/24/2010  . TB (pulmonary tuberculosis)    took med for 6 months currently negative    No past surgical history on file.  Family History  Problem Relation Age of Onset  . Diabetes Paternal Grandmother     Social History   Socioeconomic History  . Marital status: Married    Spouse name: Not on file  . Number of children: Not on file  . Years of education: Not on file  . Highest education level: Not on file  Occupational History  . Not on file  Tobacco Use  . Smoking status: Never Smoker  . Smokeless tobacco: Never Used  Substance and Sexual Activity  . Alcohol use: No  . Drug use: No  . Sexual activity: Not on file  Other Topics Concern  . Not on file  Social History Narrative  . Not on file   Social Determinants of Health   Financial Resource Strain:   . Difficulty of Paying Living Expenses: Not on file  Food Insecurity:   . Worried About Programme researcher, broadcasting/film/video in the Last Year: Not on file  . Ran Out of Food in the Last Year: Not on file  Transportation Needs:   . Lack of Transportation (Medical): Not on file  . Lack of Transportation (Non-Medical): Not on file  Physical Activity:   . Days of Exercise per Week: Not on file  . Minutes of Exercise per Session: Not on file  Stress:   . Feeling of Stress : Not on file  Social Connections:   . Frequency of Communication with Friends and Family: Not on file  . Frequency of Social Gatherings with Friends and Family: Not on file  . Attends Religious Services: Not on file    . Active Member of Clubs or Organizations: Not on file  . Attends Banker Meetings: Not on file  . Marital Status: Not on file  Intimate Partner Violence:   . Fear of Current or Ex-Partner: Not on file  . Emotionally Abused: Not on file  . Physically Abused: Not on file  . Sexually Abused: Not on file     PHYSICAL EXAM:  VS: There were no vitals taken for this visit. Physical Exam Gen: NAD, alert, cooperative with exam, well-appearing MSK:  ***      ASSESSMENT & PLAN:   No problem-specific Assessment & Plan notes found for this encounter.

## 2019-03-27 ENCOUNTER — Ambulatory Visit: Payer: Medicaid Other | Attending: Family Medicine | Admitting: Physical Therapy

## 2019-04-26 ENCOUNTER — Other Ambulatory Visit: Payer: Self-pay

## 2019-04-26 ENCOUNTER — Encounter (HOSPITAL_COMMUNITY): Payer: Self-pay

## 2019-04-26 ENCOUNTER — Telehealth (HOSPITAL_COMMUNITY): Payer: Self-pay | Admitting: *Deleted

## 2019-04-26 ENCOUNTER — Encounter (HOSPITAL_COMMUNITY): Payer: Self-pay | Admitting: Obstetrics & Gynecology

## 2019-04-26 ENCOUNTER — Inpatient Hospital Stay (HOSPITAL_COMMUNITY)
Admission: AD | Admit: 2019-04-26 | Discharge: 2019-04-28 | DRG: 807 | Disposition: A | Discharge status: Home / Self Care | Payer: Medicaid Other | Attending: Family Medicine | Admitting: Family Medicine

## 2019-04-26 ENCOUNTER — Ambulatory Visit (HOSPITAL_BASED_OUTPATIENT_CLINIC_OR_DEPARTMENT_OTHER)
Admission: RE | Admit: 2019-04-26 | Discharge: 2019-04-26 | Disposition: A | Discharge status: Home / Self Care | Payer: Medicaid Other | Source: Ambulatory Visit | Attending: Obstetrics and Gynecology | Admitting: Obstetrics and Gynecology

## 2019-04-26 ENCOUNTER — Other Ambulatory Visit (HOSPITAL_COMMUNITY): Payer: Self-pay | Admitting: Nurse Practitioner

## 2019-04-26 DIAGNOSIS — O99214 Obesity complicating childbirth: Secondary | ICD-10-CM | POA: Diagnosis present

## 2019-04-26 DIAGNOSIS — Z20822 Contact with and (suspected) exposure to covid-19: Secondary | ICD-10-CM | POA: Diagnosis present

## 2019-04-26 DIAGNOSIS — Z3A40 40 weeks gestation of pregnancy: Secondary | ICD-10-CM

## 2019-04-26 DIAGNOSIS — O480 Post-term pregnancy: Secondary | ICD-10-CM

## 2019-04-26 DIAGNOSIS — E669 Obesity, unspecified: Secondary | ICD-10-CM | POA: Diagnosis present

## 2019-04-26 DIAGNOSIS — Z141 Cystic fibrosis carrier: Secondary | ICD-10-CM | POA: Diagnosis not present

## 2019-04-26 LAB — TYPE AND SCREEN
ABO/RH(D): A POS
Antibody Screen: NEGATIVE

## 2019-04-26 LAB — CBC
HCT: 42 % (ref 36.0–46.0)
Hemoglobin: 13.3 g/dL (ref 12.0–15.0)
MCH: 28.5 pg (ref 26.0–34.0)
MCHC: 31.7 g/dL (ref 30.0–36.0)
MCV: 89.9 fL (ref 80.0–100.0)
Platelets: 129 10*3/uL — ABNORMAL LOW (ref 150–400)
RBC: 4.67 MIL/uL (ref 3.87–5.11)
RDW: 16.3 % — ABNORMAL HIGH (ref 11.5–15.5)
WBC: 8.5 10*3/uL (ref 4.0–10.5)
nRBC: 0 % (ref 0.0–0.2)

## 2019-04-26 LAB — ABO/RH: ABO/RH(D): A POS

## 2019-04-26 LAB — SARS CORONAVIRUS 2 (TAT 6-24 HRS): SARS Coronavirus 2: NEGATIVE

## 2019-04-26 MED ORDER — MISOPROSTOL 50MCG HALF TABLET
50.0000 ug | ORAL_TABLET | ORAL | Status: DC | PRN
  Administered 2019-04-26 – 2019-04-27 (×3): 50 ug via ORAL
  Filled 2019-04-26 (×3): qty 1

## 2019-04-26 MED ORDER — OXYTOCIN BOLUS FROM INFUSION
500.0000 mL | Freq: Once | INTRAVENOUS | Status: AC
  Administered 2019-04-27: 500 mL via INTRAVENOUS

## 2019-04-26 MED ORDER — LIDOCAINE HCL (PF) 1 % IJ SOLN: 30.0000 mL | INTRAMUSCULAR | Status: DC | PRN

## 2019-04-26 MED ORDER — LACTATED RINGERS IV SOLN: 500.0000 mL | INTRAVENOUS | Status: DC | PRN

## 2019-04-26 MED ORDER — FAMOTIDINE 20 MG PO TABS
10.0000 mg | ORAL_TABLET | Freq: Every day | ORAL | Status: DC
  Administered 2019-04-27: 10 mg via ORAL
  Filled 2019-04-26: qty 1

## 2019-04-26 MED ORDER — OXYCODONE-ACETAMINOPHEN 5-325 MG PO TABS: 1.0000 | ORAL_TABLET | ORAL | Status: DC | PRN

## 2019-04-26 MED ORDER — SOD CITRATE-CITRIC ACID 500-334 MG/5ML PO SOLN: 30.0000 mL | ORAL | Status: DC | PRN

## 2019-04-26 MED ORDER — TERBUTALINE SULFATE 1 MG/ML IJ SOLN: 0.2500 mg | Freq: Once | INTRAMUSCULAR | Status: DC | PRN

## 2019-04-26 MED ORDER — ACETAMINOPHEN 325 MG PO TABS: 650.0000 mg | ORAL_TABLET | ORAL | Status: DC | PRN

## 2019-04-26 MED ORDER — OXYCODONE-ACETAMINOPHEN 5-325 MG PO TABS: 2.0000 | ORAL_TABLET | ORAL | Status: DC | PRN

## 2019-04-26 MED ORDER — OXYTOCIN 40 UNITS IN NORMAL SALINE INFUSION - SIMPLE MED
2.5000 [IU]/h | INTRAVENOUS | Status: DC
  Filled 2019-04-26: qty 1000

## 2019-04-26 MED ORDER — LACTATED RINGERS IV SOLN: INTRAVENOUS | Status: DC

## 2019-04-26 MED ORDER — ONDANSETRON HCL 4 MG/2ML IJ SOLN: 4.0000 mg | Freq: Four times a day (QID) | INTRAMUSCULAR | Status: DC | PRN

## 2019-04-26 NOTE — H&P (Addendum)
OBSTETRIC ADMISSION HISTORY AND PHYSICAL  Melanie Baldwin is a 34 y.o. female G2P1001 with IUP at [redacted]w[redacted]d presenting for BPP 4/8, post dates. Hx of vacuum assisted delivery in 2012.  She reports +FMs. No LOF, VB, blurry vision, headaches, peripheral edema, or RUQ pain. She plans on breast and bottle feeding. Condoms for birth control.   Dating: By LMP --->  Estimated Date of Delivery: 04/23/19  Sono:    @[redacted]w[redacted]d , cephalic presentation   Prenatal History/Complications: CF carrier, genetic counseling in [place], Low risk fetus  Nuchal translucency - too late at presentation  Nephrolithasis (Rx Percocet / Flomax 11/2018)   Past Medical History: Past Medical History:  Diagnosis Date   Cystic fibrosis carrier    Normal pregnancy 09/24/2010   TB (pulmonary tuberculosis)    took med for 6 months currently negative    Past Surgical History: History reviewed. No pertinent surgical history.  Obstetrical History: OB History     Gravida  2   Para  1   Term  1   Preterm  0   AB  0   Living  1      SAB  0   TAB  0   Ectopic  0   Multiple  0   Live Births  1           Social History: Social History   Socioeconomic History   Marital status: Married    Spouse name: Not on file   Number of children: Not on file   Years of education: Not on file   Highest education level: Not on file  Occupational History   Not on file  Tobacco Use   Smoking status: Never Smoker   Smokeless tobacco: Never Used  Substance and Sexual Activity   Alcohol use: No   Drug use: No   Sexual activity: Yes  Other Topics Concern   Not on file  Social History Narrative   Not on file   Social Determinants of Health   Financial Resource Strain:    Difficulty of Paying Living Expenses:   Food Insecurity:    Worried About Charity fundraiser in the Last Year:    Arboriculturist in the Last Year:   Transportation Needs:    Film/video editor (Medical):    Lack of  Transportation (Non-Medical):   Physical Activity:    Days of Exercise per Week:    Minutes of Exercise per Session:   Stress:    Feeling of Stress :   Social Connections:    Frequency of Communication with Friends and Family:    Frequency of Social Gatherings with Friends and Family:    Attends Religious Services:    Active Member of Clubs or Organizations:    Attends Music therapist:    Marital Status:     Family History: Family History  Problem Relation Age of Onset   Diabetes Paternal Grandmother     Allergies: No Known Allergies  Medications Prior to Admission  Medication Sig Dispense Refill Last Dose   Prenatal Vit-Fe Fumarate-FA (PRENATAL PO) Take by mouth.   04/26/2019 at Unknown time   acetaminophen (TYLENOL) 500 MG tablet Take 1 tablet (500 mg total) by mouth every 6 (six) hours as needed. 30 tablet 0    cyclobenzaprine (FLEXERIL) 5 MG tablet Take 1 tablet (5 mg total) by mouth 3 (three) times daily as needed for muscle spasms. 30 tablet 0    metoCLOPramide (REGLAN) 10 MG  tablet Take 1 tablet (10 mg total) by mouth every 6 (six) hours. 15 tablet 0    predniSONE (DELTASONE) 5 MG tablet Take 6 pills for first day, 5 pills second day, 4 pills third day, 3 pills fourth day, 2 pills the fifth day, and 1 pill sixth day. 21 tablet 0    tamsulosin (FLOMAX) 0.4 MG CAPS capsule Take 1 capsule (0.4 mg total) by mouth daily. 14 capsule 0      Review of Systems:  All systems reviewed and negative except as stated in HPI  PE: Last menstrual period 07/17/2018. General appearance: alert, cooperative and no distress Lungs: regular rate and effort Heart: regular rate  Abdomen: soft, non-tender Extremities: Homans sign is negative, no sign of DVT Presentation: cephalic EFM: 130s bpm, moderate variability, no accels, no decels Toco: none   SVE: 1/thick/-3  Prenatal labs: ABO, Rh: A/Positive/-- (09/21 0000) Antibody: Negative (09/21 0000) Rubella: Immune (09/21  0000) RPR: Nonreactive (09/21 0000)  HBsAg: Negative (09/21 0000)  HIV: Non-reactive (09/21 0000)  Hep C: equivocal 10/16/18  GBS: Negative/-- (03/01 0000)  1 hr GTT 133, normal 3 hr GTT  Prenatal Transfer Tool  Maternal Diabetes: No Genetic Screening: Declined Maternal Ultrasounds/Referrals: Other: nuchal translucency too late in presentation Fetal Ultrasounds or other Referrals:  Referred to Materal Fetal Medicine , BPP 4/8 Maternal Substance Abuse:  No Significant Maternal Medications:  None Significant Maternal Lab Results: Group B Strep negative  No results found for this or any previous visit (from the past 24 hour(s)).  Patient Active Problem List   Diagnosis Date Noted   Post-dates pregnancy 04/26/2019   Acute bilateral low back pain with left-sided sciatica 01/22/2019   Delivery by vacuum extractor affecting fetus or newborn 09/25/2010   SVD (spontaneous vaginal delivery) 09/24/2010    Assessment: Melanie Baldwin is a 34 y.o. G2P1001 at [redacted]w[redacted]d here for IOL for BPP 4/8 and post dates.   1. Labor: Start induction.  Insert Foley balloon and Cytotec. 2. FWB: Cat I 3. Pain: per patient request  4. GBS: negative    Plan: Admit to L&D. Options of foley bulb, AROM, and pitocin reviewed, with use of each discussed.   Katha Cabal, DO  04/26/2019, 6:29 PM    Midwife attestation: I have seen and examined this patient; I agree with above documentation in the resident's note.   PE: Gen: calm comfortable, NAD Resp: normal effort and rate Abd: gravid EFW by leopolds 7-7.5  ROS, labs, PMH reviewed  Assessment/Plan: Melanie Baldwin is a 34 y.o. G2P1001 here for IOL for BPP 4/8 Admit to LD Labor: latent FWB: Cat I GBS neg Cervical ripening Anticipate SVD  Donette Larry, CNM  04/26/2019, 7:39 PM

## 2019-04-26 NOTE — Progress Notes (Addendum)
Labor Progress Note Melanie Baldwin is a 34 y.o. G2P1001 at [redacted]w[redacted]d presented for IOL for BPP 4/8  S:  Comfortable, no c/o.   O:  BP 128/85   Pulse 97   Temp 98.4 F (36.9 C) (Oral)   Resp 18   Ht 5\' 3"  (1.6 m)   Wt 82.6 kg   LMP 07/17/2018 (Exact Date)   BMI 32.24 kg/m  EFM: baseline 135 bpm/ mod variability/ + accels/ no decels  Toco/IUPC: rare SVE: 1.5/50/-3, vtx  A/P: 34 y.o. G2P1001 [redacted]w[redacted]d  1. Labor: latent 2. FWB: Cat I 3. Pain: analgesia/anesthesia prn  Consented for FB, inserted w/o problem, pt tolerated well. Will start Cytotec. Anticipate labor progress and SVD.  [redacted]w[redacted]d, CNM 7:41 PM

## 2019-04-26 NOTE — Telephone Encounter (Signed)
Preadmission screen  

## 2019-04-26 NOTE — Progress Notes (Signed)
Patient ID: Melanie Baldwin, female   DOB: 31-Oct-1985, 34 y.o.   MRN: [id number] Comfortable FHR reassuring Contractions irregular  Foley balloon in place Received Cytotec   Pt states has no questions

## 2019-04-27 ENCOUNTER — Inpatient Hospital Stay (HOSPITAL_COMMUNITY): Payer: Medicaid Other | Admitting: Anesthesiology

## 2019-04-27 ENCOUNTER — Encounter (HOSPITAL_COMMUNITY): Payer: Self-pay | Admitting: Obstetrics & Gynecology

## 2019-04-27 DIAGNOSIS — Z3A40 40 weeks gestation of pregnancy: Secondary | ICD-10-CM

## 2019-04-27 LAB — RPR: RPR Ser Ql: NONREACTIVE

## 2019-04-27 LAB — CBC
HCT: 39.7 % (ref 36.0–46.0)
HCT: 39.1 % (ref 36.0–46.0)
Hemoglobin: 12.7 g/dL (ref 12.0–15.0)
Hemoglobin: 12.3 g/dL (ref 12.0–15.0)
MCH: 28.2 pg (ref 26.0–34.0)
MCH: 28 pg (ref 26.0–34.0)
MCHC: 32 g/dL (ref 30.0–36.0)
MCHC: 31.5 g/dL (ref 30.0–36.0)
MCV: 88 fL (ref 80.0–100.0)
MCV: 89.1 fL (ref 80.0–100.0)
Platelets: 104 10*3/uL — ABNORMAL LOW (ref 150–400)
Platelets: 106 10*3/uL — ABNORMAL LOW (ref 150–400)
RBC: 4.51 MIL/uL (ref 3.87–5.11)
RBC: 4.39 MIL/uL (ref 3.87–5.11)
RDW: 16.1 % — ABNORMAL HIGH (ref 11.5–15.5)
RDW: 15.9 % — ABNORMAL HIGH (ref 11.5–15.5)
WBC: 11.9 10*3/uL — ABNORMAL HIGH (ref 4.0–10.5)
WBC: 14.5 10*3/uL — ABNORMAL HIGH (ref 4.0–10.5)
nRBC: 0 % (ref 0.0–0.2)
nRBC: 0 % (ref 0.0–0.2)

## 2019-04-27 MED ORDER — PHENYLEPHRINE 40 MCG/ML (10ML) SYRINGE FOR IV PUSH (FOR BLOOD PRESSURE SUPPORT): 80.0000 ug | PREFILLED_SYRINGE | INTRAVENOUS | Status: DC | PRN

## 2019-04-27 MED ORDER — ONDANSETRON HCL 4 MG PO TABS: 4.0000 mg | ORAL_TABLET | ORAL | Status: DC | PRN

## 2019-04-27 MED ORDER — LIDOCAINE HCL (PF) 1 % IJ SOLN
INTRAMUSCULAR | Status: DC | PRN
  Administered 2019-04-27: 10 mL via EPIDURAL
  Administered 2019-04-27: 2 mL via EPIDURAL

## 2019-04-27 MED ORDER — EPHEDRINE 5 MG/ML INJ: 10.0000 mg | INTRAVENOUS | Status: DC | PRN

## 2019-04-27 MED ORDER — ONDANSETRON HCL 4 MG/2ML IJ SOLN: 4.0000 mg | INTRAMUSCULAR | Status: DC | PRN

## 2019-04-27 MED ORDER — WITCH HAZEL-GLYCERIN EX PADS: 1.0000 "application " | MEDICATED_PAD | CUTANEOUS | Status: DC | PRN

## 2019-04-27 MED ORDER — TETANUS-DIPHTH-ACELL PERTUSSIS 5-2.5-18.5 LF-MCG/0.5 IM SUSP: 0.5000 mL | Freq: Once | INTRAMUSCULAR | Status: DC

## 2019-04-27 MED ORDER — LACTATED RINGERS IV SOLN
500.0000 mL | Freq: Once | INTRAVENOUS | Status: AC
  Administered 2019-04-27: 500 mL via INTRAVENOUS

## 2019-04-27 MED ORDER — FENTANYL-BUPIVACAINE-NACL 0.5-0.125-0.9 MG/250ML-% EP SOLN
12.0000 mL/h | EPIDURAL | Status: DC | PRN
  Filled 2019-04-27: qty 250

## 2019-04-27 MED ORDER — SODIUM CHLORIDE (PF) 0.9 % IJ SOLN
INTRAMUSCULAR | Status: DC | PRN
  Administered 2019-04-27: 12 mL/h via EPIDURAL

## 2019-04-27 MED ORDER — DIPHENHYDRAMINE HCL 25 MG PO CAPS: 25.0000 mg | ORAL_CAPSULE | Freq: Four times a day (QID) | ORAL | Status: DC | PRN

## 2019-04-27 MED ORDER — ZOLPIDEM TARTRATE 5 MG PO TABS: 5.0000 mg | ORAL_TABLET | Freq: Every evening | ORAL | Status: DC | PRN

## 2019-04-27 MED ORDER — OXYTOCIN 40 UNITS IN NORMAL SALINE INFUSION - SIMPLE MED
1.0000 m[IU]/min | INTRAVENOUS | Status: DC
  Administered 2019-04-27: 2 m[IU]/min via INTRAVENOUS

## 2019-04-27 MED ORDER — SIMETHICONE 80 MG PO CHEW
80.0000 mg | CHEWABLE_TABLET | ORAL | Status: DC | PRN
  Administered 2019-04-28: 13:00:00 80 mg via ORAL

## 2019-04-27 MED ORDER — DIPHENHYDRAMINE HCL 50 MG/ML IJ SOLN: 12.5000 mg | INTRAMUSCULAR | Status: DC | PRN

## 2019-04-27 MED ORDER — IBUPROFEN 600 MG PO TABS
600.0000 mg | ORAL_TABLET | Freq: Four times a day (QID) | ORAL | Status: DC
  Administered 2019-04-27 – 2019-04-28 (×3): 600 mg via ORAL
  Filled 2019-04-27 (×3): qty 1

## 2019-04-27 MED ORDER — DIBUCAINE (PERIANAL) 1 % EX OINT: 1.0000 "application " | TOPICAL_OINTMENT | CUTANEOUS | Status: DC | PRN

## 2019-04-27 MED ORDER — ACETAMINOPHEN 325 MG PO TABS
650.0000 mg | ORAL_TABLET | ORAL | Status: DC | PRN
  Administered 2019-04-27 – 2019-04-28 (×3): 650 mg via ORAL
  Filled 2019-04-27 (×3): qty 2

## 2019-04-27 MED ORDER — BENZOCAINE-MENTHOL 20-0.5 % EX AERO: 1.0000 | INHALATION_SPRAY | CUTANEOUS | Status: DC | PRN

## 2019-04-27 MED ORDER — TERBUTALINE SULFATE 1 MG/ML IJ SOLN: 0.2500 mg | Freq: Once | INTRAMUSCULAR | Status: DC | PRN

## 2019-04-27 MED ORDER — PRENATAL MULTIVITAMIN CH
1.0000 | ORAL_TABLET | Freq: Every day | ORAL | Status: DC
  Administered 2019-04-28: 1 via ORAL
  Filled 2019-04-27: qty 1

## 2019-04-27 MED ORDER — SENNOSIDES-DOCUSATE SODIUM 8.6-50 MG PO TABS
2.0000 | ORAL_TABLET | ORAL | Status: DC
  Administered 2019-04-28: 2 via ORAL
  Filled 2019-04-27: qty 2

## 2019-04-27 MED ORDER — COCONUT OIL OIL: 1.0000 "application " | TOPICAL_OIL | Status: DC | PRN

## 2019-04-27 NOTE — Discharge Summary (Signed)
Postpartum Discharge Summary      Patient Name: Melanie Baldwin DOB: 1985-05-08 MRN: [id number]  Date of admission: 04/26/2019 Delivering Provider: Chauncey Mann   Date of discharge: 04/28/2019  Admitting diagnosis: Post-dates pregnancy [O48.0] Intrauterine pregnancy: [redacted]w[redacted]d    Secondary diagnosis:  Active Problems:   Post-dates pregnancy   [redacted] weeks gestation of pregnancy   Vaginal delivery  Additional problems: None     Discharge diagnosis: Term Pregnancy Delivered                                                                                                Post partum procedures:None  Augmentation: AROM, Pitocin, Cytotec and Foley Balloon  Complications: None  Hospital course:  Induction of Labor With Vaginal Delivery   34y.o. yo G2P1001 at 32w4das admitted to the hospital 04/26/2019 for induction of labor.  Indication for induction: BPP 4/8.  Patient had an uncomplicated labor course as follows: Initial SVE 1.5 cm. Patient received Cytotec, FB, Pitocin and AROM. Progressed to complete with uncomplicated delivery.  Membrane Rupture Time/Date: 12:13 PM ,04/27/2019   Intrapartum Procedures: Episiotomy: None [1]                                         Lacerations:  1st degree [2]  Patient had delivery of a Viable infant.  Information for the patient's newborn:  PoPaullette, Mckainirl [id number]]Delivery Method: Vag-Spont    04/27/2019  Details of delivery can be found in separate delivery note.  Patient had a routine postpartum course. Patient is discharged home 04/28/19. Delivery time: 1:50 PM    Magnesium Sulfate received: No BMZ received: No Rhophylac:No MMR:No Transfusion:No  Physical exam  Vitals:   04/27/19 1645 04/27/19 2120 04/28/19 0105 04/28/19 0500  BP: 118/75 121/70 102/69 108/67  Pulse: (!) 120 (!) 116 88 89  Resp: '18 18 18 18  ' Temp: 98.9 F (37.2 C) 98.9 F (37.2 C) 97.7 F (36.5 C) (!) 97.4 F (36.3 C)  TempSrc: Oral Oral Oral Oral  SpO2: 100%  100% 100% 100%  Weight:      Height:       General: alert, cooperative and no distress Lochia: appropriate Uterine Fundus: firm Incision: N/A DVT Evaluation: No evidence of DVT seen on physical exam. Labs: Lab Results  Component Value Date   WBC 14.5 (H) 04/27/2019   HGB 12.3 04/27/2019   HCT 39.1 04/27/2019   MCV 89.1 04/27/2019   PLT 106 (L) 04/27/2019   CMP Latest Ref Rng & Units 12/15/2018  Glucose 70 - 99 mg/dL 101(H)  BUN 6 - 20 mg/dL 11  Creatinine 0.44 - 1.00 mg/dL 0.58  Sodium 135 - 145 mmol/L 138  Potassium 3.5 - 5.1 mmol/L 3.5  Chloride 98 - 111 mmol/L 108  CO2 22 - 32 mmol/L 22  Calcium 8.9 - 10.3 mg/dL 8.8(L)  Total Protein 6.5 - 8.1 g/dL 7.0  Total Bilirubin 0.3 - 1.2 mg/dL 0.2(L)  Alkaline Phos 38 - 126  U/L 59  AST 15 - 41 U/L 18  ALT 0 - 44 U/L 13   Edinburgh Score: No flowsheet data found.  Discharge instruction: per After Visit Summary and "Baby and Me Booklet".  After visit meds:  Allergies as of 04/28/2019   No Known Allergies     Medication List    STOP taking these medications   cyclobenzaprine 5 MG tablet Commonly known as: FLEXERIL   metoCLOPramide 10 MG tablet Commonly known as: REGLAN   predniSONE 5 MG tablet Commonly known as: DELTASONE   tamsulosin 0.4 MG Caps capsule Commonly known as: FLOMAX     TAKE these medications   acetaminophen 325 MG tablet Commonly known as: Tylenol Take 2 tablets (650 mg total) by mouth every 4 (four) hours as needed (for pain scale < 4). What changed:   medication strength  how much to take  when to take this  reasons to take this   ibuprofen 600 MG tablet Commonly known as: ADVIL Take 1 tablet (600 mg total) by mouth every 6 (six) hours.   PRENATAL PO Take by mouth.       Diet: routine diet  Activity: Advance as tolerated. Pelvic rest for 6 weeks.   Outpatient follow up:4 weeks Follow up Appt:No future appointments. Follow up Visit: Follow-up Information    Department,  [facility] Follow up in 4 week(s).   Contact information: [place] [address] [phone]           Patient to f/u at HD.    Newborn Data: Live born female  Birth Weight: 3005g  APGAR: 12, 9  Newborn Delivery   Birth date/time: 04/27/2019 13:50:00 Delivery type: Vaginal, Spontaneous      Baby Feeding: Bottle and Breast Disposition:home with mother   04/28/2019 Lajean Manes, CNM

## 2019-04-27 NOTE — Anesthesia Procedure Notes (Signed)
Epidural Patient location during procedure: OB Start time: 04/27/2019 9:37 AM End time: 04/27/2019 9:45 AM  Staffing Anesthesiologist: Lannie Fields, DO Performed: anesthesiologist   Preanesthetic Checklist Completed: patient identified, IV checked, risks and benefits discussed, monitors and equipment checked, pre-op evaluation and timeout performed  Epidural Patient position: sitting Prep: DuraPrep and site prepped and draped Patient monitoring: continuous pulse ox, blood pressure, heart rate and cardiac monitor Approach: midline Location: L3-L4 Injection technique: LOR air  Needle:  Needle type: Tuohy  Needle gauge: 17 G Needle length: 9 cm Needle insertion depth: 6 cm Catheter type: closed end flexible Catheter size: 19 Gauge Catheter at skin depth: 11 cm Test dose: negative  Assessment Sensory level: T8 Events: blood not aspirated, injection not painful, no injection resistance, no paresthesia and negative IV test  Additional Notes Patient identified. Risks/Benefits/Options discussed with patient including but not limited to bleeding, infection, nerve damage, paralysis, failed block, incomplete pain control, headache, blood pressure changes, nausea, vomiting, reactions to medication both or allergic, itching and postpartum back pain. Confirmed with bedside nurse the patient's most recent platelet count. Confirmed with patient that they are not currently taking any anticoagulation, have any bleeding history or any family history of bleeding disorders. Patient expressed understanding and wished to proceed. All questions were answered. Sterile technique was used throughout the entire procedure. Please see nursing notes for vital signs. Test dose was given through epidural catheter and negative prior to continuing to dose epidural or start infusion. Warning signs of high block given to the patient including shortness of breath, tingling/numbness in hands, complete motor block,  or any concerning symptoms with instructions to call for help. Patient was given instructions on fall risk and not to get out of bed. All questions and concerns addressed with instructions to call with any issues or inadequate analgesia.  Reason for block:procedure for pain

## 2019-04-27 NOTE — Progress Notes (Addendum)
Labor Progress Note Melanie Baldwin is a 34 y.o. G2P1001 at [redacted]w[redacted]d presented for BPP 4/8 and post dates.  S: Mom doing well.   O:  BP 122/68   Pulse (!) 104   Temp 97.8 F (36.6 C) (Oral)   Resp 18   Ht 5\' 3"  (1.6 m)   Wt 82.6 kg   LMP 07/17/2018 (Exact Date)   SpO2 98%   BMI 32.24 kg/m  EFM: 130s /moderative variability / no decels, no acels   CVE: Dilation: 6.5 Effacement (%): 70 Cervical Position: Posterior Station: -2 Presentation: Vertex Exam by:: [email or web address], RN    A&P: 34 y.o. G2P1001 [redacted]w[redacted]d for BPP 4/8 and post dates.  #Labor: Progressing well. Continue pitocin.  Reassess. #Pain: Epidural #FWB: Cat I  #GBS negative   Amarius Toto, DO 10:04 AM

## 2019-04-27 NOTE — Progress Notes (Signed)
Patient ID: Melanie Baldwin, female   DOB: [birth year], 34 y.o.   MRN: [id number] Foley came out between 0030 and 0100. Cervix at that time was 4cm but thick  Cytotec given last at 0509  FHR reassuring Irregular contractions  Dilation: 4 Effacement (%): 50 Cervical Position: Posterior Station: -3 Presentation: Vertex Exam by:: Devoria Albe, RN    Will start Pitocin at 0900

## 2019-04-27 NOTE — Anesthesia Preprocedure Evaluation (Addendum)
Anesthesia Evaluation  Patient identified by MRN, date of birth, ID band Patient awake    Reviewed: Allergy & Precautions, H&P , Patient's Chart, lab work & pertinent test results  Airway Mallampati: II  TM Distance: >3 FB Neck ROM: full    Dental  (+) Teeth Intact   Pulmonary  Hx TB -treated   breath sounds clear to auscultation       Cardiovascular negative cardio ROS   Rhythm:regular Rate:Normal     Neuro/Psych negative neurological ROS  negative psych ROS   GI/Hepatic negative GI ROS, Neg liver ROS,   Endo/Other  Obesity BMI 32  Renal/GU negative Renal ROS  negative genitourinary   Musculoskeletal negative musculoskeletal ROS (+)   Abdominal   Peds  Hematology  (+) Blood dyscrasia, , Gestational thrombocytopenia- plt 129 (135 11/2018), repeat plt this AM 104   Anesthesia Other Findings       Reproductive/Obstetrics (+) Pregnancy                            Anesthesia Physical Anesthesia Plan  ASA: II and emergent  Anesthesia Plan: Epidural   Post-op Pain Management:    Induction:   PONV Risk Score and Plan: 2  Airway Management Planned: Natural Airway  Additional Equipment: None  Intra-op Plan:   Post-operative Plan:   Informed Consent: I have reviewed the patients History and Physical, chart, labs and discussed the procedure including the risks, benefits and alternatives for the proposed anesthesia with the patient or authorized representative who has indicated his/her understanding and acceptance.       Plan Discussed with:   Anesthesia Plan Comments:         Anesthesia Quick Evaluation

## 2019-04-27 NOTE — Anesthesia Postprocedure Evaluation (Signed)
Anesthesia Post Note  Patient: Melanie Baldwin  Procedure(s) Performed: AN AD HOC LABOR EPIDURAL     Patient location during evaluation: Mother Baby Anesthesia Type: Epidural Level of consciousness: awake and alert Pain management: pain level controlled Vital Signs Assessment: post-procedure vital signs reviewed and stable Respiratory status: spontaneous breathing, nonlabored ventilation and respiratory function stable Cardiovascular status: stable Postop Assessment: no headache, no backache and epidural receding Anesthetic complications: no    Last Vitals:  Vitals:   04/27/19 1545 04/27/19 1645  BP: 114/72 118/75  Pulse: (!) 108 (!) 120  Resp: 18 18  Temp: 37.4 C 37.2 C  SpO2: 100% 100%    Last Pain:  Vitals:   04/27/19 1930  TempSrc:   PainSc: 0-No pain   Pain Goal:                   [name]

## 2019-04-28 ENCOUNTER — Other Ambulatory Visit (HOSPITAL_COMMUNITY): Payer: Medicaid Other

## 2019-04-28 MED ORDER — ACETAMINOPHEN 325 MG PO TABS: 650.0000 mg | ORAL_TABLET | ORAL | 0 refills | Status: DC | PRN

## 2019-04-28 MED ORDER — IBUPROFEN 600 MG PO TABS: 600.0000 mg | ORAL_TABLET | Freq: Four times a day (QID) | ORAL | 0 refills | Status: DC

## 2019-04-28 NOTE — Discharge Instructions (Signed)

## 2019-04-28 NOTE — Lactation Note (Addendum)
This note was copied from a baby's chart. Lactation Consultation Note  Patient Name: Melanie Baldwin Today's Date: 04/28/2019 Reason for consult: Initial assessment;1st time breastfeeding;Term P2, 12 hour female infant. Mom with hx of : TB and Hep C.  Per mom, she receives [facility] in [facility] and mom doesn't have breast pump at home. Per mom, she did not breastfeed her 32 year old daughter.  Mom feeding choice at admission was breast and formula feeding. Mom made two previous attempts to latch infant at breast, per mom, infant will not sustain latch or falls asleep, mom has been latching infant swaddle in clothing and not STS.  Mom latched infant on left breast using the football hold position, LC repeatedly ask mom to support infant's head, neck and back while infant was latched at breast. LC observed infant swallows and infant breastfed for 16 minutes, eventually mom started offering good support with position when [facility] became hand off and mom was still breastfeeding when [facility] left the room. Mom understands to breast feed infant according to hunger cues, 8 to 12 times within 24 hours and not exceed 3 hours without breastfeeding infant. Mom taught back hand expression and infant was given 1 ml of colostrum by spoon, prior to mom latching infant at breast. Mom will do STS while feeding infant at breast. LC gave mom hand pump and explained how to use do to mom not having breast pump at home. Mom shown how to use hand pump  & how to disassemble, clean, & reassemble parts. Mom knows to ask RN or LC for assistance if she has any questions, concerns or needs assistance with latching infant at breast. Mom made aware of O/P services, breastfeeding support groups, community resources, and our phone # for post-discharge questions.  Current 24 hour breastfeeding plan: 1. Mom will breastfeed infant according hunger cues, 8 to 12 times within 24 hours and not exceed 3 hours without breastfeeding  infant. 2. Mom's choice, mom will supplement infant with  with formula after latching infant at breast first for each feeding  according to supplemental guidelines's 5-7 mls per feeding within day 1 ( 0-24 hours of life) sheet given to parents. 3. Parents will do as much STS as possible with infant. 4. Mom knows to ask RN or LC for assistance with latching infant at breast if needed.  Maternal Data Formula Feeding for Exclusion: Yes Reason for exclusion: Mother's choice to formula and breast feed on admission Has patient been taught Hand Expression?: Yes Does the patient have breastfeeding experience prior to this delivery?: No  Feeding Feeding Type: Breast Fed Nipple Type: Slow - flow  LATCH Score Latch: Grasps breast easily, tongue down, lips flanged, rhythmical sucking.  Audible Swallowing: Spontaneous and intermittent  Type of Nipple: Everted at rest and after stimulation  Comfort (Breast/Nipple): Soft / non-tender  Hold (Positioning): Full assist, staff holds infant at breast  LATCH Score: 8  Interventions Interventions: Breast feeding basics reviewed;Breast compression;Assisted with latch;Adjust position;Skin to skin;Support pillows;Breast massage;Position options;Hand express;Expressed milk  Lactation Tools Discussed/Used WIC Program: Yes Pump Review: Setup, frequency, and cleaning;Milk Storage Initiated by:: Melanie Baldwin, IBCLC Date initiated:: 04/28/19   Consult Status Consult Status: Follow-up Date: 04/28/19 Follow-up type: In-patient    Danelle Earthly 04/28/2019, 2:05 AM

## 2019-04-28 NOTE — Lactation Note (Signed)
This note was copied from a baby's chart. Lactation Consultation Note  Patient Name: Melanie Baldwin PZWCH'E Date: 04/28/2019 Reason for consult: Follow-up assessment;1st time breastfeeding;Infant weight loss;Term  Visited with mom of a 77 hours old FT female; she's a P2 but this is her first time BF. Mom and baby are going home, baby is at 4% weight loss. Baby has been mostly on [employer], but mom said that she'll be doing both when she gets home, breast and formula.  Explained to mom that due to her history of Hepatitis C, she'll need to pay special attention to any dryness/cracking that may cause bleeding at the breast. Mom will use coconut oil and her own colostrum for breast and nipple care to avoid shafting. Dad told LC that they only formula fed her first child, but that they'll be taking the hand pump and will be using it to feed this baby some breastmilk in a bottle, praised them for their efforts.  Reviewed discharge instructions, engorgement prevention/treatment and treatment/prevention of sore nipples. Encouraged mom to pump whenever she's giving baby formula. Dad present and supportive. Parents reported all questions and concerns were answered, they're both aware of LC OP services and will call PRN.   Maternal Data    Feeding Feeding Type: Bottle Fed - Formula Nipple Type: Slow - flow  LATCH Score                   Interventions Interventions: Breast feeding basics reviewed;Coconut oil;Hand pump  Lactation Tools Discussed/Used Tools: Pump Breast pump type: Manual   Consult Status Consult Status: Complete Date: 04/28/19 Follow-up type: Call as needed    Teletha Petrea Venetia Constable 04/28/2019, 6:04 PM

## 2019-04-30 ENCOUNTER — Inpatient Hospital Stay (HOSPITAL_COMMUNITY)
Admission: AD | Admit: 2019-04-30 | Payer: Medicaid Other | Source: Home / Self Care | Admitting: Obstetrics and Gynecology

## 2019-04-30 ENCOUNTER — Inpatient Hospital Stay (HOSPITAL_COMMUNITY): Payer: Medicaid Other

## 2019-10-18 ENCOUNTER — Other Ambulatory Visit: Payer: Self-pay

## 2019-10-18 ENCOUNTER — Encounter (HOSPITAL_COMMUNITY): Payer: Self-pay

## 2019-10-18 DIAGNOSIS — M545 Low back pain: Secondary | ICD-10-CM | POA: Diagnosis not present

## 2019-10-18 NOTE — ED Triage Notes (Signed)
Patient arrived stating she has had lower back pain since Sunday. Declines any falls or injury. History of same. Reports taking Tylenol for pain with no relief.

## 2019-10-19 ENCOUNTER — Emergency Department (HOSPITAL_COMMUNITY)
Admission: EM | Admit: 2019-10-19 | Discharge: 2019-10-19 | Disposition: A | Discharge status: Home / Self Care | Payer: Medicaid Other | Attending: Emergency Medicine | Admitting: Emergency Medicine

## 2019-10-19 DIAGNOSIS — M545 Low back pain, unspecified: Secondary | ICD-10-CM

## 2019-10-19 LAB — URINALYSIS, ROUTINE W REFLEX MICROSCOPIC
Bilirubin Urine: NEGATIVE
Glucose, UA: NEGATIVE mg/dL
Hgb urine dipstick: NEGATIVE
Ketones, ur: NEGATIVE mg/dL
Nitrite: NEGATIVE
Protein, ur: NEGATIVE mg/dL
Specific Gravity, Urine: 1.03 (ref 1.005–1.030)
pH: 5 (ref 5.0–8.0)

## 2019-10-19 LAB — PREGNANCY, URINE: Preg Test, Ur: NEGATIVE

## 2019-10-19 MED ORDER — DEXAMETHASONE SODIUM PHOSPHATE 10 MG/ML IJ SOLN
10.0000 mg | Freq: Once | INTRAMUSCULAR | Status: AC
  Administered 2019-10-19: 10 mg via INTRAMUSCULAR
  Filled 2019-10-19: qty 1

## 2019-10-19 MED ORDER — PREDNISONE 20 MG PO TABS: 40.0000 mg | ORAL_TABLET | Freq: Every day | ORAL | 0 refills | Status: DC

## 2019-10-19 MED ORDER — METHOCARBAMOL 500 MG PO TABS
500.0000 mg | ORAL_TABLET | Freq: Once | ORAL | Status: AC
  Administered 2019-10-19: 500 mg via ORAL
  Filled 2019-10-19: qty 1

## 2019-10-19 MED ORDER — TRAMADOL HCL 50 MG PO TABS
50.0000 mg | ORAL_TABLET | Freq: Once | ORAL | Status: AC
  Administered 2019-10-19: 50 mg via ORAL
  Filled 2019-10-19: qty 1

## 2019-10-19 MED ORDER — METHOCARBAMOL 500 MG PO TABS: 500.0000 mg | ORAL_TABLET | Freq: Two times a day (BID) | ORAL | 0 refills | Status: DC | PRN

## 2019-10-19 NOTE — ED Notes (Signed)
Labeled urine specimen and culture sent to lab. ENMiles 

## 2019-10-19 NOTE — ED Provider Notes (Signed)
[facility]-EMERGENCY DEPT Provider Note   CSN: [id number] Arrival date & time: 10/18/19  2058     History Chief Complaint  Patient presents with  . Back Pain    Melanie Baldwin is a 34 y.o. female.  34 year old female presents to the emergency department for evaluation of low back pain.  She has a history of chronic low back pain which is intermittent.  States that her pain feels similar and began to worsen again on Sunday.  It migrates between her right and left low back.  Will occasionally radiate down her left leg with associated left lower extremity paresthesias.  No recent fall, trauma, injury.  Denies frequent heavy lifting.  She has been taking Tylenol for symptoms without relief.  No fevers, night sweats, genital or perianal numbness, bowel or bladder incontinence, inability to ambulate.  The history is provided by the patient. No language interpreter was used.  Back Pain      Past Medical History:  Diagnosis Date  . Cystic fibrosis carrier   . Normal pregnancy 09/24/2010  . TB (pulmonary tuberculosis)    took med for 6 months currently negative    Patient Active Problem List   Diagnosis Date Noted  . Vaginal delivery 04/28/2019  . [redacted] weeks gestation of pregnancy 04/27/2019  . Post-dates pregnancy 04/26/2019  . Acute bilateral low back pain with left-sided sciatica 01/22/2019  . Delivery by vacuum extractor affecting fetus or newborn 09/25/2010  . SVD (spontaneous vaginal delivery) 09/24/2010    History reviewed. No pertinent surgical history.   OB History    Gravida  2   Para  2   Term  2   Preterm  0   AB  0   Living  2     SAB  0   TAB  0   Ectopic  0   Multiple  0   Live Births  2           Family History  Problem Relation Age of Onset  . Diabetes Paternal Grandmother     Social History   Tobacco Use  . Smoking status: Never Smoker  . Smokeless tobacco: Never Used  Vaping Use  . Vaping Use: Never used   Substance Use Topics  . Alcohol use: No  . Drug use: No    Home Medications Prior to Admission medications   Medication Sig Start Date End Date Taking? Authorizing Provider  acetaminophen (TYLENOL) 325 MG tablet Take 2 tablets (650 mg total) by mouth every 4 (four) hours as needed (for pain scale < 4). 04/28/19   Sandre Kitty, MD  ibuprofen (ADVIL) 600 MG tablet Take 1 tablet (600 mg total) by mouth every 6 (six) hours. 04/28/19   Sandre Kitty, MD  methocarbamol (ROBAXIN) 500 MG tablet Take 1 tablet (500 mg total) by mouth every 12 (twelve) hours as needed for muscle spasms. 10/19/19   Antony Madura, PA-C  predniSONE (DELTASONE) 20 MG tablet Take 2 tablets (40 mg total) by mouth daily. Take 40 mg by mouth daily for 3 days, then 20mg  by mouth daily for 3 days, then 10mg  daily for 3 days 10/19/19   , PA-C  Prenatal Vit-Fe Fumarate-FA (PRENATAL PO) Take by mouth.    [provider]    Allergies    Patient has no known allergies.  Review of Systems   Review of Systems  Musculoskeletal: Positive for back pain.  Ten systems reviewed and are negative for acute change,  except as noted in the HPI.    Physical Exam Updated Vital Signs BP 129/90 (BP Location: Left Arm)   Pulse 87   Temp 98.3 F (36.8 C) (Oral)   Resp 18   Ht 5\' 4"  (1.626 m)   Wt 80.4 kg   SpO2 100%   BMI 30.41 kg/m   Physical Exam Vitals and nursing note reviewed.  Constitutional:      General: She is not in acute distress.    Appearance: She is well-developed. She is not diaphoretic.     Comments: Nontoxic appearing and in NAD  HENT:     Head: Normocephalic and atraumatic.  Eyes:     General: No scleral icterus.    Conjunctiva/sclera: Conjunctivae normal.  Cardiovascular:     Rate and Rhythm: Normal rate and regular rhythm.     Pulses: Normal pulses.     Comments: DP pulse 2+ bilaterally Pulmonary:     Effort: Pulmonary effort is normal. No respiratory distress.     Comments:  Respirations even and unlabored Musculoskeletal:        General: Normal range of motion.     Cervical back: Normal range of motion.  Skin:    General: Skin is warm and dry.     Coloration: Skin is not pale.     Findings: No erythema or rash.  Neurological:     Mental Status: She is alert and oriented to person, place, and time.     Comments: GCS 15. 5/5 strength against resistance in all major muscle groups in BLE. Sensation to light touch intact and equal. Patient ambulatory with steady gait.  Psychiatric:        Behavior: Behavior normal.     ED Results / Procedures / Treatments   Labs (all labs ordered are listed, but only abnormal results are displayed) Labs Reviewed  URINALYSIS, ROUTINE W REFLEX MICROSCOPIC - Abnormal; Notable for the following components:      Result Value   Leukocytes,Ua TRACE (*)    Bacteria, UA RARE (*)    All other components within normal limits  PREGNANCY, URINE    EKG None  Radiology No results found.  Procedures Procedures (including critical care time)  Medications Ordered in ED Medications  methocarbamol (ROBAXIN) tablet 500 mg (has no administration in time range)  traMADol (ULTRAM) tablet 50 mg (has no administration in time range)  dexamethasone (DECADRON) injection 10 mg (has no administration in time range)    ED Course  I have reviewed the triage vital signs and the nursing notes.  Pertinent labs & imaging results that were available during my care of the patient were reviewed by me and considered in my medical decision making (see chart for details).    MDM Rules/Calculators/A&P                          Patient with back pain.  Hx of same.  Neurovascularly intact and afebrile.  Able to ambulate in the department without assistance.  No loss of bowel or bladder control.  No concern for cauda equina.  No fever, night sweats, weight loss, h/o cancer, IVDU.  Will discharge on course of prednisone as well as Robaxin for  management of muscle spasms.  Encouraged follow-up with a primary care doctor.  Return precautions discussed and provided. Patient discharged in stable condition with no unaddressed concerns.   Final Clinical Impression(s) / ED Diagnoses Final diagnoses:  Acute exacerbation of chronic low  back pain    Rx / DC Orders ED Discharge Orders         Ordered    predniSONE (DELTASONE) 20 MG tablet  Daily        10/19/19 0102    methocarbamol (ROBAXIN) 500 MG tablet  Every 12 hours PRN        10/19/19 0102           Antony Madura, PA-C 10/19/19 0118    Nira Conn, MD 10/19/19 [phone]

## 2019-10-19 NOTE — Discharge Instructions (Signed)
Alternate ice and heat to areas of injury 3-4 times per day to limit inflammation and spasm.  Avoid strenuous activity and heavy lifting.  We recommend consistent use of prednisone in addition to Robaxin for muscle spasms.  Do not drive or drink alcohol after taking Robaxin as it may make you drowsy and impair your judgment.  We recommend follow-up with a primary care doctor to ensure resolution of symptoms.  Return to the ED for any new or concerning symptoms.

## 2019-11-15 ENCOUNTER — Other Ambulatory Visit: Payer: Self-pay

## 2019-11-15 ENCOUNTER — Encounter (HOSPITAL_COMMUNITY): Payer: Self-pay

## 2019-11-15 ENCOUNTER — Emergency Department (HOSPITAL_COMMUNITY)
Admission: EM | Admit: 2019-11-15 | Discharge: 2019-11-15 | Disposition: A | Discharge status: Home / Self Care | Payer: Medicaid Other | Attending: Emergency Medicine | Admitting: Emergency Medicine

## 2019-11-15 DIAGNOSIS — R202 Paresthesia of skin: Secondary | ICD-10-CM | POA: Insufficient documentation

## 2019-11-15 DIAGNOSIS — G8929 Other chronic pain: Secondary | ICD-10-CM | POA: Diagnosis present

## 2019-11-15 DIAGNOSIS — M5441 Lumbago with sciatica, right side: Secondary | ICD-10-CM | POA: Diagnosis not present

## 2019-11-15 NOTE — ED Triage Notes (Signed)
Patient c/o sudden onset of right lower back pain that radiates into the right leg since last night.

## 2019-11-15 NOTE — ED Provider Notes (Signed)
[facility]-EMERGENCY DEPT Provider Note   CSN: [id number] Arrival date & time: 11/15/19  1616     History Chief Complaint  Patient presents with  . Back Pain    Melanie Baldwin is a 34 y.o. female who presented to the ED for low back pain and sciatica.  Patient states that the back pain started 3-4 weeks ago.  She denies any trauma.  She was pregnant 6 months ago and had left lower back pain.  Pain now radiates to the right side with burning and tingling sensation of her right lateral medial upper thigh.  She denies bowel or urinary incontinence, fever, chills, flank pain, weight loss.  She endorses mild difficulty with urination.  Patient was seen by sports medicine and had physical therapy, which patient states that it helps.  She was seen yesterday at the urgent care and was told that they will order an MRI of her lower back but need to wait on her insurance company approval.  No order of MRI seen in chart review.  She is taking tramadol, meloxicam and Flexeril with minimal relief.  HPI     Past Medical History:  Diagnosis Date  . Cystic fibrosis carrier   . Normal pregnancy 09/24/2010  . TB (pulmonary tuberculosis)    took med for 6 months currently negative    Patient Active Problem List   Diagnosis Date Noted  . Chronic bilateral low back pain with right-sided sciatica 11/15/2019  . Vaginal delivery 04/28/2019  . [redacted] weeks gestation of pregnancy 04/27/2019  . Post-dates pregnancy 04/26/2019  . Acute bilateral low back pain with left-sided sciatica 01/22/2019  . Delivery by vacuum extractor affecting fetus or newborn 09/25/2010  . SVD (spontaneous vaginal delivery) 09/24/2010    History reviewed. No pertinent surgical history.   OB History    Gravida  2   Para  2   Term  2   Preterm  0   AB  0   Living  2     SAB  0   TAB  0   Ectopic  0   Multiple  0   Live Births  2           Family History  Problem Relation Age of Onset   . Diabetes Paternal Grandmother     Social History   Tobacco Use  . Smoking status: Never Smoker  . Smokeless tobacco: Never Used  Vaping Use  . Vaping Use: Never used  Substance Use Topics  . Alcohol use: No  . Drug use: No    Home Medications Prior to Admission medications   Medication Sig Start Date End Date Taking? Authorizing Provider  acetaminophen (TYLENOL) 325 MG tablet Take 2 tablets (650 mg total) by mouth every 4 (four) hours as needed (for pain scale < 4). 04/28/19   Sandre Kitty, MD  ibuprofen (ADVIL) 600 MG tablet Take 1 tablet (600 mg total) by mouth every 6 (six) hours. 04/28/19   Sandre Kitty, MD  methocarbamol (ROBAXIN) 500 MG tablet Take 1 tablet (500 mg total) by mouth every 12 (twelve) hours as needed for muscle spasms. 10/19/19   Antony Madura, PA-C  predniSONE (DELTASONE) 20 MG tablet Take 2 tablets (40 mg total) by mouth daily. Take 40 mg by mouth daily for 3 days, then 20mg  by mouth daily for 3 days, then 10mg  daily for 3 days 10/19/19   , PA-C  Prenatal Vit-Fe Fumarate-FA (PRENATAL PO) Take by mouth.  [provider]    Allergies    Patient has no known allergies.  Review of Systems   Review of Systems  Gastrointestinal:       No bowels incontinence  Genitourinary:       No incontinence  Musculoskeletal: Positive for back pain. Negative for gait problem.       Right side sciatica    Physical Exam Updated Vital Signs BP 120/85 (BP Location: Left Arm)   Pulse 95   Temp 97.8 F (36.6 C) (Oral)   Resp 16   Ht 5\' 4"  (1.626 m)   Wt 77.1 kg   LMP 11/10/2019   SpO2 100%   BMI 29.18 kg/m   Physical Exam Constitutional:      General: She is not in acute distress. HENT:     Head: Normocephalic.  Eyes:     General:        Right eye: No discharge.        Left eye: No discharge.  Cardiovascular:     Rate and Rhythm: Normal rate and regular rhythm.  Pulmonary:     Effort: No respiratory distress.     Breath sounds:  Normal breath sounds.  Abdominal:     Tenderness: There is no right CVA tenderness or left CVA tenderness.  Musculoskeletal:     Right lower leg: No edema.     Left lower leg: No edema.     Comments: Patient tends to side bend to the left in neutral position. No midline tenderness No tenderness to palpation of paraspinal muscles bilaterally Muscle hypertonicity, worse on the right side Limited hip flexion and extension due to pain Normal gait relation  Skin:    General: Skin is warm.     Coloration: Skin is not jaundiced.  Neurological:     Mental Status: She is alert.     ED Results / Procedures / Treatments   Labs (all labs ordered are listed, but only abnormal results are displayed) Labs Reviewed - No data to display  EKG None  Radiology No results found.  Procedures Procedures (including critical care time)  Medications Ordered in ED Medications - No data to display  ED Course  I have reviewed the triage vital signs and the nursing notes.  Pertinent labs & imaging results that were available during my care of the patient were reviewed by me and considered in my medical decision making (see chart for details).    MDM Rules/Calculators/A&P                          Patient presents to the ED for low back pain with right-sided sciatica.  The back pain started 3-4 weeks ago with no trauma related.  She was seen by sport medicine and have physical therapy done which was helpful per patient.  She came in today for new neurological symptom of tingling and burning sensation of lateral and medial right upper thigh.  No bowel or urinary incontinence, fever, chills, flank pain, weight loss, night sweats.  This is likely sciatica due to nerve impingement on the right side.  Low suspicion for cauda equina or vertebral abscess.  Patient is instructed to follow-up with her back MRI when her insurance company approves.  Continue taking her tramadol, lidocaine and Flexeril.  Also  instructed patient to do some light stretching exercise for her muscle tightness.  Follow-up with sports medicine physician.  Come back to the ED for worsening pain  or new neurological symptoms.  Patient agrees with plan   Final Clinical Impression(s) / ED Diagnoses Final diagnoses:  Chronic bilateral low back pain with right-sided sciatica    Rx / DC Orders ED Discharge Orders    None       Doran Stabler, DO 11/15/19 1752    Melene Plan, DO 11/15/19 1952

## 2019-11-15 NOTE — Discharge Instructions (Addendum)
Melanie Baldwin, it is a pleasure getting to know you today.  You came to the ED for low back pain and numbness feeling in your right thigh.  This is likely sciatica due to nerve impingement.  Please follow-up with the outpatient lower back MRI.  Continue taking your tramadol, meloxicam and Flexeril.  You can also try light stretching exercise of your lower back to reduce the muscle tightness.  Follow-up with your primary physician.  Come back to the ED for worsening pain or new neurological symptoms such as loss of bowel or urinary control.  Take care

## 2019-11-21 ENCOUNTER — Encounter (HOSPITAL_COMMUNITY): Payer: Self-pay | Admitting: *Deleted

## 2019-11-21 ENCOUNTER — Emergency Department (HOSPITAL_COMMUNITY): Payer: Medicaid Other

## 2019-11-21 ENCOUNTER — Emergency Department (HOSPITAL_COMMUNITY)
Admission: EM | Admit: 2019-11-21 | Discharge: 2019-11-21 | Disposition: A | Discharge status: Home / Self Care | Payer: Medicaid Other | Attending: Emergency Medicine | Admitting: Emergency Medicine

## 2019-11-21 ENCOUNTER — Other Ambulatory Visit: Payer: Self-pay

## 2019-11-21 DIAGNOSIS — M5410 Radiculopathy, site unspecified: Secondary | ICD-10-CM

## 2019-11-21 DIAGNOSIS — R000 Tachycardia, unspecified: Secondary | ICD-10-CM | POA: Insufficient documentation

## 2019-11-21 DIAGNOSIS — M5416 Radiculopathy, lumbar region: Secondary | ICD-10-CM | POA: Diagnosis not present

## 2019-11-21 DIAGNOSIS — M545 Low back pain, unspecified: Secondary | ICD-10-CM | POA: Diagnosis present

## 2019-11-21 LAB — I-STAT BETA HCG BLOOD, ED (MC, WL, AP ONLY): I-stat hCG, quantitative: <5 m[IU]/mL (ref <5)

## 2019-11-21 MED ORDER — PREDNISONE 20 MG PO TABS
60.0000 mg | ORAL_TABLET | Freq: Once | ORAL | Status: AC
  Administered 2019-11-21: 60 mg via ORAL
  Filled 2019-11-21: qty 3

## 2019-11-21 MED ORDER — KETOROLAC TROMETHAMINE 60 MG/2ML IM SOLN
30.0000 mg | Freq: Once | INTRAMUSCULAR | Status: AC
  Administered 2019-11-21: 30 mg via INTRAMUSCULAR
  Filled 2019-11-21: qty 2

## 2019-11-21 MED ORDER — IBUPROFEN 600 MG PO TABS: 600.0000 mg | ORAL_TABLET | Freq: Four times a day (QID) | ORAL | 0 refills | Status: DC | PRN

## 2019-11-21 MED ORDER — PREDNISONE 20 MG PO TABS
40.0000 mg | ORAL_TABLET | Freq: Every day | ORAL | 0 refills | Status: DC
Start: 2019-11-21 — End: 2019-12-07

## 2019-11-21 MED ORDER — METHOCARBAMOL 500 MG PO TABS: 500.0000 mg | ORAL_TABLET | Freq: Two times a day (BID) | ORAL | 0 refills | Status: DC | PRN

## 2019-11-21 NOTE — Discharge Instructions (Addendum)
Take medications prescribed as needed for your symptoms.  Please talk to your doctor and hopefully you can get your MRI of your lumbar spine soon for further evaluation.  Return to the ER if your symptoms worsen or if you have any other concern.

## 2019-11-21 NOTE — ED Notes (Signed)
Pt c/o bilateral lower leg pain that has been recurrent pain over the past 6 wks. Pt c/o of numbness and pain to both legs. Pt states she has taken tramadol for the pain with no relief

## 2019-11-21 NOTE — ED Notes (Signed)
Pt back from x-ray.

## 2019-11-21 NOTE — ED Triage Notes (Signed)
Pt has been having back pain for 3-4 weeks however last night she began to have BLE numbness that has worsened today.

## 2019-11-21 NOTE — ED Provider Notes (Signed)
[facility]-EMERGENCY DEPT Provider Note   CSN: [id number] Arrival date & time: 11/21/19  2026     History No chief complaint on file.   Melanie Baldwin is a 34 y.o. female.  The history is provided by the patient. No language interpreter was used.     34 year old female with history of chronic back pain presenting complaining of back pain.  Patient report for the past month she has had intermittent pain to her lower back.  Pain is waxing waning and in the past improved with steroid.  However for the past week she has had worsening pain in her back and now radiates to both of her thighs with associated numbness.  She also felt it is difficult to urinate.  She was seen in the ED for her complaint several days ago.  States she was given tramadol which makes her drowsy but did not improve her symptoms.  She was seen by a spine specialist previously and was told that she would benefit from an MRI of her back.  She is currently awaiting for insurance to give clearance before she can have the MRI.  She is here due to increased infection seen to both of her lower extremities.  She denies any significant injury to her back.  No history of IV drug use or active cancer.  She denies bowel incontinence or rash.  No dysuria.  Past Medical History:  Diagnosis Date  . Cystic fibrosis carrier   . Normal pregnancy 09/24/2010  . TB (pulmonary tuberculosis)    took med for 6 months currently negative    Patient Active Problem List   Diagnosis Date Noted  . Chronic bilateral low back pain with right-sided sciatica 11/15/2019  . Vaginal delivery 04/28/2019  . [redacted] weeks gestation of pregnancy 04/27/2019  . Post-dates pregnancy 04/26/2019  . Acute bilateral low back pain with left-sided sciatica 01/22/2019  . Delivery by vacuum extractor affecting fetus or newborn 09/25/2010  . SVD (spontaneous vaginal delivery) 09/24/2010    History reviewed. No pertinent surgical history.   OB  History    Gravida  2   Para  2   Term  2   Preterm  0   AB  0   Living  2     SAB  0   TAB  0   Ectopic  0   Multiple  0   Live Births  2           Family History  Problem Relation Age of Onset  . Diabetes Paternal Grandmother     Social History   Tobacco Use  . Smoking status: Never Smoker  . Smokeless tobacco: Never Used  Vaping Use  . Vaping Use: Never used  Substance Use Topics  . Alcohol use: No  . Drug use: No    Home Medications Prior to Admission medications   Medication Sig Start Date End Date Taking? Authorizing Provider  acetaminophen (TYLENOL) 325 MG tablet Take 2 tablets (650 mg total) by mouth every 4 (four) hours as needed (for pain scale < 4). 04/28/19   Sandre Kitty, MD  ibuprofen (ADVIL) 600 MG tablet Take 1 tablet (600 mg total) by mouth every 6 (six) hours. 04/28/19   Sandre Kitty, MD  methocarbamol (ROBAXIN) 500 MG tablet Take 1 tablet (500 mg total) by mouth every 12 (twelve) hours as needed for muscle spasms. 10/19/19   Antony Madura, PA-C  predniSONE (DELTASONE) 20 MG tablet Take 2 tablets (40  mg total) by mouth daily. Take 40 mg by mouth daily for 3 days, then 20mg  by mouth daily for 3 days, then 10mg  daily for 3 days 10/19/19   , PA-C  Prenatal Vit-Fe Fumarate-FA (PRENATAL PO) Take by mouth.    [provider]    Allergies    Patient has no known allergies.  Review of Systems   Review of Systems  All other systems reviewed and are negative.   Physical Exam Updated Vital Signs BP (!) 145/93   Pulse (!) 126   Temp 98.3 F (36.8 C) (Oral)   Resp 17   Ht 5\' 4"  (1.626 m)   Wt 77.1 kg   LMP 11/07/2019   SpO2 99%   BMI 29.18 kg/m   Physical Exam Vitals and nursing note reviewed. Exam conducted with a chaperone present.  Constitutional:      General: She is not in acute distress.    Appearance: She is well-developed.  HENT:     Head: Atraumatic.  Eyes:     Conjunctiva/sclera: Conjunctivae  normal.  Cardiovascular:     Rate and Rhythm: Tachycardia present.     Pulses: Normal pulses.     Heart sounds: Normal heart sounds.  Pulmonary:     Effort: Pulmonary effort is normal.     Breath sounds: Normal breath sounds.  Abdominal:     Palpations: Abdomen is soft.     Tenderness: There is no abdominal tenderness.  Genitourinary:    Comments: Sonya, RN available to chaperone.  Decreased rectal tone on exam.  No obvious mass, no impacted stool.  Normal color stool on glove. Musculoskeletal:     Cervical back: Neck supple.     Comments: Tenderness to lumbar and bilateral lumbosacral on palpation with negative straight leg raise.  Slightly decreased sensation to bilateral lower extremities with 4 out of 5 strength to bilateral lower extremities.  Intact pedal pulses bilaterally.  Skin:    Capillary Refill: Capillary refill takes less than 2 seconds.     Findings: No rash.     Comments: Patellar deep tendon reflex intact bilaterally.  Neurological:     Mental Status: She is alert and oriented to person, place, and time.     ED Results / Procedures / Treatments   Labs (all labs ordered are listed, but only abnormal results are displayed) Labs Reviewed  URINALYSIS, ROUTINE W REFLEX MICROSCOPIC  I-STAT BETA HCG BLOOD, ED ([facility], [facility], [facility] ONLY)    EKG None  Radiology DG Lumbar Spine Complete  Result Date: 11/21/2019 CLINICAL DATA:  Back pain EXAM: LUMBAR SPINE - COMPLETE 4+ VIEW COMPARISON:  MRI lumbar spine dated 11/05/2017 FINDINGS: There is no evidence of lumbar spine fracture. Alignment is normal. Intervertebral disc spaces are maintained. Transitional vertebral anatomy is again noted. IMPRESSION: Negative. Electronically Signed   By: 11/09/2019 M.D.   On: 11/21/2019 23:33    Procedures Procedures (including critical care time)  Medications Ordered in ED Medications  ketorolac (TORADOL) injection 30 mg (30 mg Intramuscular Given 11/21/19 2209)  predniSONE  (DELTASONE) tablet 60 mg (60 mg Oral Given 11/21/19 2211)    ED Course  I have reviewed the triage vital signs and the nursing notes.  Pertinent labs & imaging results that were available during my care of the patient were reviewed by me and considered in my medical decision making (see chart for details).    MDM Rules/Calculators/A&P  BP 110/75   Pulse (!) 114   Temp 98.3 F (36.8 C) (Oral)   Resp 17   Ht 5\' 4"  (1.626 m)   Wt 77.1 kg   LMP 11/07/2019   SpO2 99%   BMI 29.18 kg/m  Tachycardic, pt attributed to pain   Final Clinical Impression(s) / ED Diagnoses Final diagnoses:  Radicular low back pain    Rx / DC Orders ED Discharge Orders         Ordered    ibuprofen (ADVIL) 600 MG tablet  Every 6 hours PRN        11/21/19 2347    methocarbamol (ROBAXIN) 500 MG tablet  Every 12 hours PRN        11/21/19 2347    predniSONE (DELTASONE) 20 MG tablet  Daily        11/21/19 2347         10:05 PM Patient here with progressive worsening radicular back pain radiates to both legs and also reported having some difficulty urinating.  This has been an ongoing issue for nearly a month but has become progressively worse.  She does have mildly decreased rectal tone on exam, and mildly decreased sensation to bilateral thigh.  She is able to ambulate.  She did complain of some mild burning on urination.  Will check pregnancy test, UA, and x-ray of her lumbar spine.  She would likely benefit from outpatient MRI at some point but not emergent.  11:43 PM Pregnancy test is negative, L-spine x-ray unremarkable.  Since patient able to ambulate, will provide prednisone as it has helped in the past.  Muscle relaxant given.  Recommend MRI outpatient but return precaution given.   11/23/19, PA-C 11/21/19 2348    11/23/19, MD 11/22/19 [id number]

## 2019-11-21 NOTE — ED Notes (Signed)
Pt to restroom with steady gait. Pt states she was unable to give urine sample at this time

## 2019-12-04 ENCOUNTER — Emergency Department (HOSPITAL_COMMUNITY): Payer: Medicaid Other

## 2019-12-04 ENCOUNTER — Emergency Department (HOSPITAL_COMMUNITY): Payer: Medicaid Other | Admitting: Certified Registered Nurse Anesthetist

## 2019-12-04 ENCOUNTER — Inpatient Hospital Stay (HOSPITAL_COMMUNITY): Payer: Medicaid Other

## 2019-12-04 ENCOUNTER — Other Ambulatory Visit: Payer: Self-pay

## 2019-12-04 ENCOUNTER — Encounter (HOSPITAL_BASED_OUTPATIENT_CLINIC_OR_DEPARTMENT_OTHER): Payer: Self-pay | Admitting: Emergency Medicine

## 2019-12-04 ENCOUNTER — Inpatient Hospital Stay (HOSPITAL_BASED_OUTPATIENT_CLINIC_OR_DEPARTMENT_OTHER)
Admission: EM | Admit: 2019-12-04 | Discharge: 2019-12-07 | DRG: 029 | Disposition: A | Discharge status: Home / Self Care | Payer: Medicaid Other | Attending: Neurosurgery | Admitting: Neurosurgery

## 2019-12-04 ENCOUNTER — Encounter (HOSPITAL_COMMUNITY)
Admission: EM | Disposition: A | Discharge status: Home / Self Care | Payer: Self-pay | Source: Home / Self Care | Attending: Neurosurgery

## 2019-12-04 DIAGNOSIS — M48061 Spinal stenosis, lumbar region without neurogenic claudication: Secondary | ICD-10-CM | POA: Diagnosis present

## 2019-12-04 DIAGNOSIS — M5441 Lumbago with sciatica, right side: Principal | ICD-10-CM

## 2019-12-04 DIAGNOSIS — G8918 Other acute postprocedural pain: Secondary | ICD-10-CM

## 2019-12-04 DIAGNOSIS — M5442 Lumbago with sciatica, left side: Secondary | ICD-10-CM

## 2019-12-04 DIAGNOSIS — Z419 Encounter for procedure for purposes other than remedying health state, unspecified: Secondary | ICD-10-CM

## 2019-12-04 DIAGNOSIS — M5116 Intervertebral disc disorders with radiculopathy, lumbar region: Secondary | ICD-10-CM | POA: Diagnosis present

## 2019-12-04 DIAGNOSIS — K592 Neurogenic bowel, not elsewhere classified: Secondary | ICD-10-CM | POA: Diagnosis present

## 2019-12-04 DIAGNOSIS — R339 Retention of urine, unspecified: Secondary | ICD-10-CM

## 2019-12-04 DIAGNOSIS — R739 Hyperglycemia, unspecified: Secondary | ICD-10-CM

## 2019-12-04 DIAGNOSIS — N319 Neuromuscular dysfunction of bladder, unspecified: Secondary | ICD-10-CM

## 2019-12-04 DIAGNOSIS — S343XXS Injury of cauda equina, sequela: Secondary | ICD-10-CM | POA: Diagnosis not present

## 2019-12-04 DIAGNOSIS — M5126 Other intervertebral disc displacement, lumbar region: Secondary | ICD-10-CM | POA: Diagnosis present

## 2019-12-04 DIAGNOSIS — R531 Weakness: Secondary | ICD-10-CM

## 2019-12-04 DIAGNOSIS — D62 Acute posthemorrhagic anemia: Secondary | ICD-10-CM | POA: Diagnosis not present

## 2019-12-04 DIAGNOSIS — K5901 Slow transit constipation: Secondary | ICD-10-CM | POA: Diagnosis not present

## 2019-12-04 DIAGNOSIS — Z20822 Contact with and (suspected) exposure to covid-19: Secondary | ICD-10-CM | POA: Diagnosis present

## 2019-12-04 DIAGNOSIS — Z141 Cystic fibrosis carrier: Secondary | ICD-10-CM

## 2019-12-04 DIAGNOSIS — G834 Cauda equina syndrome: Secondary | ICD-10-CM | POA: Diagnosis present

## 2019-12-04 DIAGNOSIS — M792 Neuralgia and neuritis, unspecified: Secondary | ICD-10-CM | POA: Diagnosis not present

## 2019-12-04 DIAGNOSIS — S343XXD Injury of cauda equina, subsequent encounter: Secondary | ICD-10-CM | POA: Diagnosis not present

## 2019-12-04 HISTORY — PX: LUMBAR LAMINECTOMY/DECOMPRESSION MICRODISCECTOMY: SHX5026

## 2019-12-04 LAB — CBC WITH DIFFERENTIAL/PLATELET
Abs Immature Granulocytes: 0.07 10*3/uL (ref 0.00–0.07)
Basophils Absolute: 0.1 10*3/uL (ref 0.0–0.1)
Basophils Relative: 0 %
Eosinophils Absolute: 0.2 10*3/uL (ref 0.0–0.5)
Eosinophils Relative: 1 %
HCT: 40.5 % (ref 36.0–46.0)
Hemoglobin: 13 g/dL (ref 12.0–15.0)
Immature Granulocytes: 0 %
Lymphocytes Relative: 9 %
Lymphs Abs: 1.4 10*3/uL (ref 0.7–4.0)
MCH: 27.1 pg (ref 26.0–34.0)
MCHC: 32.1 g/dL (ref 30.0–36.0)
MCV: 84.4 fL (ref 80.0–100.0)
Monocytes Absolute: 0.6 10*3/uL (ref 0.1–1.0)
Monocytes Relative: 4 %
Neutro Abs: 13.2 10*3/uL — ABNORMAL HIGH (ref 1.7–7.7)
Neutrophils Relative %: 86 %
Platelets: 227 10*3/uL (ref 150–400)
RBC: 4.8 MIL/uL (ref 3.87–5.11)
RDW: 14.8 % (ref 11.5–15.5)
WBC: 15.6 10*3/uL — ABNORMAL HIGH (ref 4.0–10.5)
nRBC: 0 % (ref 0.0–0.2)

## 2019-12-04 LAB — BASIC METABOLIC PANEL
Anion gap: 11 (ref 5–15)
BUN: 19 mg/dL (ref 6–20)
CO2: 23 mmol/L (ref 22–32)
Calcium: 9.2 mg/dL (ref 8.9–10.3)
Chloride: 102 mmol/L (ref 98–111)
Creatinine, Ser: 0.62 mg/dL (ref 0.44–1.00)
GFR, Estimated: >60 mL/min (ref >60)
Glucose, Bld: 136 mg/dL — ABNORMAL HIGH (ref 70–99)
Potassium: 4 mmol/L (ref 3.5–5.1)
Sodium: 136 mmol/L (ref 135–145)

## 2019-12-04 LAB — RESP PANEL BY RT PCR (RSV, FLU A&B, COVID)
Influenza A by PCR: NEGATIVE
Influenza B by PCR: NEGATIVE
Respiratory Syncytial Virus by PCR: NEGATIVE
SARS Coronavirus 2 by RT PCR: NEGATIVE

## 2019-12-04 LAB — URINALYSIS, ROUTINE W REFLEX MICROSCOPIC
Bilirubin Urine: NEGATIVE
Glucose, UA: NEGATIVE mg/dL
Hgb urine dipstick: NEGATIVE
Ketones, ur: NEGATIVE mg/dL
Leukocytes,Ua: NEGATIVE
Nitrite: NEGATIVE
Protein, ur: NEGATIVE mg/dL
Specific Gravity, Urine: 1.025 (ref 1.005–1.030)
pH: 6 (ref 5.0–8.0)

## 2019-12-04 SURGERY — LUMBAR LAMINECTOMY/DECOMPRESSION MICRODISCECTOMY 1 LEVEL
Anesthesia: General | Laterality: Bilateral

## 2019-12-04 MED ORDER — LIDOCAINE HCL (PF) 2 % IJ SOLN
INTRAMUSCULAR | Status: DC | PRN
  Administered 2019-12-04: 40 mg via INTRADERMAL

## 2019-12-04 MED ORDER — ONDANSETRON HCL 4 MG/2ML IJ SOLN
INTRAMUSCULAR | Status: DC | PRN
  Administered 2019-12-04: 4 mg via INTRAVENOUS

## 2019-12-04 MED ORDER — CEFAZOLIN SODIUM 1 G IJ SOLR
INTRAMUSCULAR | Status: AC
  Filled 2019-12-04: qty 20

## 2019-12-04 MED ORDER — LACTATED RINGERS IV SOLN: INTRAVENOUS | Status: DC | PRN

## 2019-12-04 MED ORDER — METHYLPREDNISOLONE ACETATE 80 MG/ML IJ SUSP
INTRAMUSCULAR | Status: DC | PRN
  Administered 2019-12-04: 80 mg

## 2019-12-04 MED ORDER — FENTANYL CITRATE (PF) 100 MCG/2ML IJ SOLN
INTRAMUSCULAR | Status: AC
  Filled 2019-12-04: qty 2

## 2019-12-04 MED ORDER — MORPHINE SULFATE (PF) 4 MG/ML IV SOLN
INTRAVENOUS | Status: AC
  Administered 2019-12-04: 4 mg via INTRAVENOUS
  Filled 2019-12-04: qty 1

## 2019-12-04 MED ORDER — 0.9 % SODIUM CHLORIDE (POUR BTL) OPTIME
TOPICAL | Status: DC | PRN
  Administered 2019-12-04: 1000 mL

## 2019-12-04 MED ORDER — PROPOFOL 10 MG/ML IV BOLUS
INTRAVENOUS | Status: DC | PRN
  Administered 2019-12-04: 120 ug via INTRAVENOUS

## 2019-12-04 MED ORDER — PROPOFOL 10 MG/ML IV BOLUS
INTRAVENOUS | Status: AC
  Filled 2019-12-04: qty 20

## 2019-12-04 MED ORDER — THROMBIN 5000 UNITS EX SOLR
CUTANEOUS | Status: AC
  Filled 2019-12-04: qty 5000

## 2019-12-04 MED ORDER — DEXAMETHASONE SODIUM PHOSPHATE 10 MG/ML IJ SOLN
INTRAMUSCULAR | Status: AC
  Filled 2019-12-04: qty 1

## 2019-12-04 MED ORDER — DEXAMETHASONE SODIUM PHOSPHATE 10 MG/ML IJ SOLN
INTRAMUSCULAR | Status: DC | PRN
  Administered 2019-12-04: 10 mg via INTRAVENOUS

## 2019-12-04 MED ORDER — BUPIVACAINE HCL (PF) 0.5 % IJ SOLN
INTRAMUSCULAR | Status: DC | PRN
  Administered 2019-12-04: 5 mL

## 2019-12-04 MED ORDER — METHYLPREDNISOLONE ACETATE 80 MG/ML IJ SUSP
INTRAMUSCULAR | Status: AC
  Filled 2019-12-04: qty 1

## 2019-12-04 MED ORDER — ONDANSETRON HCL 4 MG/2ML IJ SOLN
INTRAMUSCULAR | Status: AC
  Filled 2019-12-04: qty 2

## 2019-12-04 MED ORDER — FENTANYL CITRATE (PF) 100 MCG/2ML IJ SOLN
INTRAMUSCULAR | Status: DC | PRN
  Administered 2019-12-04: 100 ug via INTRAVENOUS

## 2019-12-04 MED ORDER — MORPHINE SULFATE (PF) 4 MG/ML IV SOLN
4.0000 mg | Freq: Once | INTRAVENOUS | Status: AC
  Filled 2019-12-04: qty 1

## 2019-12-04 MED ORDER — BUPIVACAINE HCL (PF) 0.5 % IJ SOLN
INTRAMUSCULAR | Status: AC
  Filled 2019-12-04: qty 30

## 2019-12-04 MED ORDER — LIDOCAINE-EPINEPHRINE 1 %-1:100000 IJ SOLN
INTRAMUSCULAR | Status: AC
  Filled 2019-12-04: qty 1

## 2019-12-04 MED ORDER — DEXAMETHASONE SODIUM PHOSPHATE 10 MG/ML IJ SOLN
10.0000 mg | Freq: Once | INTRAMUSCULAR | Status: AC
  Administered 2019-12-04: 10 mg via INTRAVENOUS
  Filled 2019-12-04: qty 1

## 2019-12-04 MED ORDER — FENTANYL CITRATE (PF) 250 MCG/5ML IJ SOLN
INTRAMUSCULAR | Status: DC | PRN
Start: 2019-12-04 — End: 2019-12-04
  Administered 2019-12-04: 100 ug via INTRAVENOUS
  Administered 2019-12-04 (×2): 50 ug via INTRAVENOUS

## 2019-12-04 MED ORDER — MIDAZOLAM HCL 2 MG/2ML IJ SOLN
INTRAMUSCULAR | Status: AC
  Filled 2019-12-04: qty 2

## 2019-12-04 MED ORDER — ROCURONIUM BROMIDE 100 MG/10ML IV SOLN
INTRAVENOUS | Status: DC | PRN
  Administered 2019-12-04: 50 mg via INTRAVENOUS
  Administered 2019-12-04: 20 mg via INTRAVENOUS

## 2019-12-04 MED ORDER — LIDOCAINE-EPINEPHRINE 1 %-1:100000 IJ SOLN
INTRAMUSCULAR | Status: DC | PRN
  Administered 2019-12-04: 5 mL

## 2019-12-04 MED ORDER — MIDAZOLAM HCL 2 MG/2ML IJ SOLN
INTRAMUSCULAR | Status: DC | PRN
  Administered 2019-12-04: 2 mg via INTRAVENOUS

## 2019-12-04 MED ORDER — FENTANYL CITRATE (PF) 250 MCG/5ML IJ SOLN
INTRAMUSCULAR | Status: AC
  Filled 2019-12-04: qty 5

## 2019-12-04 MED ORDER — ONDANSETRON HCL 4 MG/2ML IJ SOLN
4.0000 mg | Freq: Once | INTRAMUSCULAR | Status: AC
  Administered 2019-12-04: 4 mg via INTRAVENOUS
  Filled 2019-12-04: qty 2

## 2019-12-04 MED ORDER — CEFAZOLIN SODIUM-DEXTROSE 2-3 GM-%(50ML) IV SOLR
INTRAVENOUS | Status: DC | PRN
  Administered 2019-12-04: 2 g via INTRAVENOUS

## 2019-12-04 MED ORDER — THROMBIN 5000 UNITS EX SOLR
OROMUCOSAL | Status: DC | PRN
  Administered 2019-12-04: 5 mL via TOPICAL

## 2019-12-04 MED ORDER — SUGAMMADEX SODIUM 200 MG/2ML IV SOLN
INTRAVENOUS | Status: DC | PRN
  Administered 2019-12-04: 150 mg via INTRAVENOUS
  Administered 2019-12-04: 100 mg via INTRAVENOUS

## 2019-12-04 MED ORDER — MORPHINE SULFATE (PF) 4 MG/ML IV SOLN: 4.0000 mg | Freq: Once | INTRAVENOUS | Status: AC

## 2019-12-04 SURGICAL SUPPLY — 57 items
ADH SKN CLS APL DERMABOND .7 (GAUZE/BANDAGES/DRESSINGS) ×1
BAND INSRT 18 STRL LF DISP RB (MISCELLANEOUS) ×2
BAND RUBBER #18 3X1/16 STRL (MISCELLANEOUS) ×6 IMPLANT
BLADE CLIPPER SURG (BLADE) ×3 IMPLANT
BUR MATCHSTICK NEURO 3.0 LAGG (BURR) ×3 IMPLANT
BUR ROUND FLUTED 5 RND (BURR) ×2 IMPLANT
BUR ROUND FLUTED 5MM RND (BURR) ×1
CANISTER SUCT 3000ML PPV (MISCELLANEOUS) ×3 IMPLANT
CARTRIDGE OIL MAESTRO DRILL (MISCELLANEOUS) ×1 IMPLANT
COVER WAND RF STERILE (DRAPES) ×3 IMPLANT
DECANTER SPIKE VIAL GLASS SM (MISCELLANEOUS) ×3 IMPLANT
DERMABOND ADVANCED (GAUZE/BANDAGES/DRESSINGS) ×2
DERMABOND ADVANCED .7 DNX12 (GAUZE/BANDAGES/DRESSINGS) ×1 IMPLANT
DIFFUSER DRILL AIR PNEUMATIC (MISCELLANEOUS) ×3 IMPLANT
DRAPE LAPAROTOMY 100X72X124 (DRAPES) ×3 IMPLANT
DRAPE MICROSCOPE LEICA (MISCELLANEOUS) ×3 IMPLANT
DRAPE SURG 17X23 STRL (DRAPES) ×3 IMPLANT
DRSG OPSITE POSTOP 3X4 (GAUZE/BANDAGES/DRESSINGS) ×3 IMPLANT
DURAPREP 26ML APPLICATOR (WOUND CARE) ×3 IMPLANT
ELECT REM PT RETURN 9FT ADLT (ELECTROSURGICAL) ×3
ELECTRODE REM PT RTRN 9FT ADLT (ELECTROSURGICAL) ×1 IMPLANT
GAUZE 4X4 16PLY RFD (DISPOSABLE) IMPLANT
GAUZE SPONGE 4X4 12PLY STRL (GAUZE/BANDAGES/DRESSINGS) IMPLANT
GLOVE BIO SURGEON STRL SZ8 (GLOVE) ×6 IMPLANT
GLOVE BIOGEL PI IND STRL 7.5 (GLOVE) ×1 IMPLANT
GLOVE BIOGEL PI IND STRL 8 (GLOVE) ×2 IMPLANT
GLOVE BIOGEL PI IND STRL 8.5 (GLOVE) ×1 IMPLANT
GLOVE BIOGEL PI INDICATOR 7.5 (GLOVE) ×2
GLOVE BIOGEL PI INDICATOR 8 (GLOVE) ×4
GLOVE BIOGEL PI INDICATOR 8.5 (GLOVE) ×2
GLOVE ECLIPSE 8.0 STRL XLNG CF (GLOVE) ×3 IMPLANT
GLOVE EXAM NITRILE XL STR (GLOVE) IMPLANT
GLOVE SURG SS PI 7.0 STRL IVOR (GLOVE) ×6 IMPLANT
GOWN STRL REUS W/ TWL LRG LVL3 (GOWN DISPOSABLE) IMPLANT
GOWN STRL REUS W/ TWL XL LVL3 (GOWN DISPOSABLE) ×1 IMPLANT
GOWN STRL REUS W/TWL 2XL LVL3 (GOWN DISPOSABLE) ×3 IMPLANT
GOWN STRL REUS W/TWL LRG LVL3 (GOWN DISPOSABLE)
GOWN STRL REUS W/TWL XL LVL3 (GOWN DISPOSABLE) ×3
HEMOSTAT POWDER KIT SURGIFOAM (HEMOSTASIS) ×3 IMPLANT
KIT BASIN OR (CUSTOM PROCEDURE TRAY) ×3 IMPLANT
KIT TURNOVER KIT B (KITS) ×3 IMPLANT
NEEDLE HYPO 18GX1.5 BLUNT FILL (NEEDLE) IMPLANT
NEEDLE HYPO 25X1 1.5 SAFETY (NEEDLE) ×3 IMPLANT
NEEDLE SPNL 18GX3.5 QUINCKE PK (NEEDLE) ×3 IMPLANT
NS IRRIG 1000ML POUR BTL (IV SOLUTION) ×3 IMPLANT
OIL CARTRIDGE MAESTRO DRILL (MISCELLANEOUS) ×3
PACK LAMINECTOMY NEURO (CUSTOM PROCEDURE TRAY) ×3 IMPLANT
PAD ARMBOARD 7.5X6 YLW CONV (MISCELLANEOUS) IMPLANT
SPONGE SURGIFOAM ABS GEL SZ50 (HEMOSTASIS) IMPLANT
SUT VIC AB 0 CT1 18XCR BRD8 (SUTURE) ×1 IMPLANT
SUT VIC AB 0 CT1 8-18 (SUTURE) ×3
SUT VIC AB 2-0 CT1 18 (SUTURE) ×3 IMPLANT
SUT VIC AB 3-0 SH 8-18 (SUTURE) ×3 IMPLANT
SYR 5ML LL (SYRINGE) IMPLANT
TOWEL GREEN STERILE (TOWEL DISPOSABLE) ×3 IMPLANT
TOWEL GREEN STERILE FF (TOWEL DISPOSABLE) ×3 IMPLANT
WATER STERILE IRR 1000ML POUR (IV SOLUTION) ×3 IMPLANT

## 2019-12-04 NOTE — ED Triage Notes (Signed)
Pt reports Left lower back pain for approx 1 month, endorses urinary incontinence and inability to stand x 2 weeks. Pt reports numbness x 3 days, able to raise legs and endorses feeling touch when assessed. Pt denies injury. Pt recently seen 2 weeks pta at [facility] ED

## 2019-12-04 NOTE — ED Notes (Signed)
In and out cath done assisted by Nando, EMT.   Able to drain 1800 ml.

## 2019-12-04 NOTE — ED Notes (Signed)
ED Provider at bedside. 

## 2019-12-04 NOTE — Transfer of Care (Signed)
Immediate Anesthesia Transfer of Care Note  Patient: Melanie Baldwin  Procedure(s) Performed: Bilateral Lumbar Four-Five Microdiscectomy (Bilateral )  Patient Location: PACU  Anesthesia Type:General  Level of Consciousness: drowsy and patient cooperative  Airway & Oxygen Therapy: Patient Spontanous Breathing  Post-op Assessment: Report given to RN, Post -op Vital signs reviewed and stable and Patient moving all extremities X 4  Post vital signs: Reviewed and stable  Last Vitals:  Vitals Value Taken Time  BP 120/82 12/04/19 2335  Temp    Pulse 91 12/04/19 2336  Resp 21 12/04/19 2336  SpO2 97 % 12/04/19 2336  Vitals shown include unvalidated device data.  Last Pain:  Vitals:   12/04/19 1950  TempSrc: Oral  PainSc:          Complications: No complications documented.

## 2019-12-04 NOTE — H&P (Signed)
Chief Complaint: Low back pain, bilateral lower extremity radiculopathy, urinary incontinence   HPI: Melanie Baldwin is an 35 y.o. female who presented to the ED for progressively worsening low back, BLE weakness and decreased sensation, and urinary incontinence and retention. Patient has been seen multiple times in the last few weeks for worsening low back pain.  She presents again today with similar complaints. She reports bilateral lumbar pain that shifts from side to side based on her positioning.  She reports associated "burning" pain that radiates down her bilateral lower extremities, depending on which side of the lumbar spine her pain is occurring.  Reports associated decreased sensation across the groin as well as the lower extremities.  Reports increased urinary incontinence for the past few days stating that she has had to start wearing adult diapers. Per nursing notes and the patient, she is also experenicing significant urinary retention requiring in and out catherization. She reports inability to urinate despite having the urge since Sunday. Back pain was reported to begin about two months ago and has progressively worsened. Numbness was reported to have began about one week ago. She initially thought she would improve, but her symptoms persisted and worsened. MRI imaging of the lumbar spine was obtained and revealed a large herniated disc at the L4/5 level. No fevers, chills, abdominal pain, history of IVDA, history of cancer, night sweats.  Past Medical History:  Diagnosis Date  . Cystic fibrosis carrier   . Normal pregnancy 09/24/2010  . TB (pulmonary tuberculosis)    took med for 6 months currently negative    History reviewed. No pertinent surgical history.  Family History  Problem Relation Age of Onset  . Diabetes Paternal Grandmother    Social History:  reports that she has never smoked. She has never used smokeless tobacco. She reports that she does not drink alcohol and does  not use drugs.  Allergies: No Known Allergies  (Not in a hospital admission)   Results for orders placed or performed during the hospital encounter of 12/04/19 (from the past 48 hour(s))  Urinalysis, Routine w reflex microscopic Urine, Catheterized     Status: None   Collection Time: 12/04/19 10:51 AM  Result Value Ref Range   Color, Urine YELLOW YELLOW   APPearance CLEAR CLEAR   Specific Gravity, Urine 1.025 1.005 - 1.030   pH 6.0 5.0 - 8.0   Glucose, UA NEGATIVE NEGATIVE mg/dL   Hgb urine dipstick NEGATIVE NEGATIVE   Bilirubin Urine NEGATIVE NEGATIVE   Ketones, ur NEGATIVE NEGATIVE mg/dL   Protein, ur NEGATIVE NEGATIVE mg/dL   Nitrite NEGATIVE NEGATIVE   Leukocytes,Ua NEGATIVE NEGATIVE    Comment: Microscopic not done on urines with negative protein, blood, leukocytes, nitrite, or glucose < 500 mg/dL. Performed at [facility], [address] [place], [place] [address]   Basic metabolic panel     Status: Abnormal   Collection Time: 12/04/19 11:06 AM  Result Value Ref Range   Sodium 136 135 - 145 mmol/L   Potassium 4.0 3.5 - 5.1 mmol/L   Chloride 102 98 - 111 mmol/L   CO2 23 22 - 32 mmol/L   Glucose, Bld 136 (H) 70 - 99 mg/dL    Comment: Glucose reference range applies only to samples taken after fasting for at least 8 hours.   BUN 19 6 - 20 mg/dL   Creatinine, Ser 6.96 0.44 - 1.00 mg/dL   Calcium 9.2 8.9 - 78.9 mg/dL   GFR, Estimated >38 >10 mL/min  Comment: (NOTE) Calculated using the CKD-EPI Creatinine Equation (2021)    Anion gap 11 5 - 15    Comment: Performed at [facility], [address] [place], [place] [address]  CBC with Differential     Status: Abnormal   Collection Time: 12/04/19 11:06 AM  Result Value Ref Range   WBC 15.6 (H) 4.0 - 10.5 K/uL   RBC 4.80 3.87 - 5.11 MIL/uL   Hemoglobin 13.0 12.0 - 15.0 g/dL   HCT 25.0 36 - 46 %   MCV 84.4 80.0 - 100.0 fL   MCH 27.1 26.0 - 34.0 pg   MCHC 32.1 30.0 - 36.0 g/dL   RDW 53.9 76.7  - 34.1 %   Platelets 227 150 - 400 K/uL   nRBC 0.0 0.0 - 0.2 %   Neutrophils Relative % 86 %   Neutro Abs 13.2 (H) 1.7 - 7.7 K/uL   Lymphocytes Relative 9 %   Lymphs Abs 1.4 0.7 - 4.0 K/uL   Monocytes Relative 4 %   Monocytes Absolute 0.6 0.1 - 1.0 K/uL   Eosinophils Relative 1 %   Eosinophils Absolute 0.2 0.0 - 0.5 K/uL   Basophils Relative 0 %   Basophils Absolute 0.1 0.0 - 0.1 K/uL   Immature Granulocytes 0 %   Abs Immature Granulocytes 0.07 0.00 - 0.07 K/uL    Comment: Performed at [facility], [address] [place], [place] [address]   MR LUMBAR SPINE WO CONTRAST  Result Date: 12/04/2019 CLINICAL DATA:  Low back pain, cauda equina syndrome suspected. Urinary incontinence. EXAM: MRI LUMBAR SPINE WITHOUT CONTRAST TECHNIQUE: Multiplanar, multisequence MR imaging of the lumbar spine was performed. No intravenous contrast was administered. COMPARISON:  Lumbar radiographs 11/01/2019 FINDINGS: Segmentation: L5 is partially incorporated into the sacrum. Transverse processes of L5 articulate with the sacrum. Hypoplastic L5-S1 disc space. Alignment:  Normal Vertebrae:  Normal bone marrow.  Negative for fracture or mass. Conus medullaris and cauda equina: Conus extends to the L1-2 level. Conus and cauda equina appear normal. Paraspinal and other soft tissues: Negative Disc levels: L1-2: Negative L2-3: Negative L3-4: Negative L4-5: Large extruded disc fragment compressing the thecal sac with severe spinal stenosis. Mild subarticular stenosis bilaterally. Mild facet degeneration L5-S1: Hypoplastic disc space without stenosis IMPRESSION: L5 is partially incorporated into the sacrum Large extruded disc fragment in the midline at L4-5 with compression of thecal sac and severe spinal stenosis. Electronically Signed   By: Marlan Palau M.D.   On: 12/04/2019 13:56    Review of Systems: Per HPI  Blood pressure 131/87, pulse (!) 102, temperature 97.9 F (36.6 C), temperature source Oral,  resp. rate 16, height 5\' 4"  (1.626 m), weight 77.1 kg, last menstrual period 11/10/2019, SpO2 96 %, unknown if currently breastfeeding. Physical Exam Constitutional:      Appearance: Normal appearance.  HENT:     Head: Normocephalic and atraumatic.  Eyes:     Extraocular Movements: Extraocular movements intact.     Pupils: Pupils are equal, round, and reactive to light.  Musculoskeletal:     Cervical back: Normal range of motion.  Neurological:     Mental Status: She is alert and oriented to person, place, and time.     Sensory: Sensory deficit present.     Motor: Weakness present.     Comments: Decreased rectal tone.Decreased sensation perineum, buttocks, anus, groin, and BLE. Sensation decreased S1 distrubution > L5. Bilateral EHL 4-/5, Bilateral dorsiflexion 4-/5. Positive SLR bilaterally.  Psychiatric:        Mood and Affect: Mood normal.        Behavior: Behavior normal.        Thought Content: Thought content normal.        Judgment: Judgment normal.      Assessment/Plan 34 y.o. female with history of worsening low back pain, BLE radiculopathy, saddle anesthesia, and urinary retention. She has BLE weakness and decreased sensation. MRI was remarkable for a large midline herniated disc at the L4/5 level with severe spinal stenosis and compression of the thecal sac. Plan to transfer patient to [facility] where she will undergo emergent bilateral laminotomies and microdiscectomies at the L4/L5 level.   Council Mechanic, NP 12/04/2019, 7:06 PM

## 2019-12-04 NOTE — Brief Op Note (Signed)
12/04/2019  11:28 PM  PATIENT:  Melanie Baldwin  34 y.o. 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, severe spinal stenosis   PROCEDURE:  Procedure(s): Bilateral Lumbar Four-Five Microdiscectomy (Bilateral)  With resection of ruptured lumbar disc   SURGEON:  Surgeon(s) and Role:    Maeola Harman, MD - Primary  PHYSICIAN ASSISTANT:  Julien Girt, NP   ASSISTANTS: none   ANESTHESIA:   general  EBL:  10 mL   BLOOD ADMINISTERED:none  DRAINS: none   LOCAL MEDICATIONS USED:  MARCAINE    and LIDOCAINE   SPECIMEN:  No Specimen  DISPOSITION OF SPECIMEN:  N/A  COUNTS:  YES  TOURNIQUET:  * No tourniquets in log *  DICTATION: Patient has a huge L 45 disc rupture with significant bilateral leg weakness and cauda equina syndrome. It was elected to take her to surgery for bilateral  L 45 microdiscectomy.  Procedure: Patient was brought to the operating room and following the smooth and uncomplicated induction of general endotracheal anesthesia she was placed in a prone position on the Wilson frame. Low back was prepped and draped in the usual sterile fashion with betadine scrub and DuraPrep. Preoperative localizing X ray was obtained with a spinal needle.  Area of planned incision was infiltrated with local lidocaine. Incision was made in the midline and carried to the lumbodorsal fascia which was incised on both sides of midline. Subperiosteal dissection was performed exposing what was felt to be L 45 level. Intraoperative x-ray demonstrated marker probe at L 45.  A hemi-semi-laminectomy of L 4 was performed a high-speed drill and completed with Kerrison rongeurs and a generous foraminotomy was performed overlying the superior aspect of the L 5 lamina on both sides . Ligamentum flavum was detached and removed in a piecemeal fashion and the L  5 nerve root was decompressed laterally with removal of the superior aspect of the facet and ligamentum causing nerve root compression. The microscope was brought into the field and the L 5 nerve root and thecal sac was mobilized medially on the left, exposing a huge ruptured disc. This exposed a large amount of soft disc material and  multiple fragments were removed and these extended into the interspace which appeared to be quite soft with a disrupted annulus overlying the interspace. As a result it was elected to further decompress the interspace and remove loose disc material and this was done with a variety of pituitary rongeurs. The redundant annulus was also removed with 2 mm Kerrison rongeur. After removing these multiple disc fragments from the left, we assessed the annulus on the right and there did not appear to be any additional disc herniation on this side.   At this point it was felt that all neural elements were well decompressed and there was no evidence of residual loose disc material within the interspace. The interspace was then irrigated with saline and no additional disc material was mobilized. Hemostasis was assured with bipolar electrocautery and Surgifoam and the interspace was irrigated with Depo-Medrol and fentanyl. The lumbodorsal fascia was closed with 0 Vicryl sutures the subcutaneous tissues reapproximated 2-0 Vicryl inverted sutures and the skin edges were reapproximated with 3-0 Vicryl subcuticular stitch. The wound is dressed with Dermabond and an occlusive dressing. Patient was extubated in the operating room and taken to recovery in stable and satisfactory condition having tolerated her operation well counts were  correct at the end of the case.   PLAN OF CARE: Admit to inpatient   PATIENT DISPOSITION:  PACU - hemodynamically stable.   Delay start of Pharmacological VTE agent (>24hrs) due to surgical blood loss or risk of bleeding: yes

## 2019-12-04 NOTE — ED Notes (Signed)
Notified MRI of patient's arrival

## 2019-12-04 NOTE — ED Notes (Signed)
Patient transported to MRI 

## 2019-12-04 NOTE — ED Notes (Addendum)
Pt bladder scanned with volume of 250 mL.

## 2019-12-04 NOTE — Anesthesia Procedure Notes (Signed)
Procedure Name: Intubation Date/Time: 12/04/2019 10:00 PM Performed by: Adair Laundry, CRNA Pre-anesthesia Checklist: Patient identified, Emergency Drugs available, Suction available, Patient being monitored and Timeout performed Patient Re-evaluated:Patient Re-evaluated prior to induction Oxygen Delivery Method: Circle system utilized Preoxygenation: Pre-oxygenation with 100% oxygen Induction Type: IV induction Ventilation: Oral airway inserted - appropriate to patient size and Mask ventilation without difficulty Laryngoscope Size: Miller and 2 Grade View: Grade I Tube type: Oral Tube size: 7.0 mm Number of attempts: 1 Airway Equipment and Method: Stylet Placement Confirmation: ETT inserted through vocal cords under direct vision,  positive ETCO2 and breath sounds checked- equal and bilateral Secured at: 22 cm Tube secured with: Tape Dental Injury: Teeth and Oropharynx as per pre-operative assessment

## 2019-12-04 NOTE — ED Notes (Signed)
[facility] here to transfer patient, they assumed care

## 2019-12-04 NOTE — ED Notes (Signed)
Pt stated that she can not "pee" and her husband has to put a diaper on her. At times, she can not feel that she already urinated according to her husband.  Bladder scan done.  Result is >999 ml.  EDP made aware.

## 2019-12-04 NOTE — ED Provider Notes (Signed)
Patient seen and evaluated on arrival from our affiliated center.  5:27 PM I discussed the patient's MRI results with our neurosurgical team, and the patient and her husband.  I was concerned for substantial protrusion of the disc, thecal impingement, urinary retention, the patient will require transfer to our affiliated center for surgical repair.   Gerhard Munch, MD 12/04/19 [phone]

## 2019-12-04 NOTE — Op Note (Signed)
12/04/2019  11:28 PM  PATIENT:  Melanie Baldwin  34 y.o. 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, severe spinal stenosis   PROCEDURE:  Procedure(s): Bilateral Lumbar Four-Five Microdiscectomy (Bilateral)  With resection of ruptured lumbar disc   SURGEON:  Surgeon(s) and Role:    * Alli Jasmer, MD - Primary  PHYSICIAN ASSISTANT:  McDaniel, NP   ASSISTANTS: none   ANESTHESIA:   general  EBL:  10 mL   BLOOD ADMINISTERED:none  DRAINS: none   LOCAL MEDICATIONS USED:  MARCAINE    and LIDOCAINE   SPECIMEN:  No Specimen  DISPOSITION OF SPECIMEN:  N/A  COUNTS:  YES  TOURNIQUET:  * No tourniquets in log *  DICTATION: Patient has a huge L 45 disc rupture with significant bilateral leg weakness and cauda equina syndrome. It was elected to take her to surgery for bilateral  L 45 microdiscectomy.  Procedure: Patient was brought to the operating room and following the smooth and uncomplicated induction of general endotracheal anesthesia she was placed in a prone position on the Wilson frame. Low back was prepped and draped in the usual sterile fashion with betadine scrub and DuraPrep. Preoperative localizing X ray was obtained with a spinal needle.  Area of planned incision was infiltrated with local lidocaine. Incision was made in the midline and carried to the lumbodorsal fascia which was incised on both sides of midline. Subperiosteal dissection was performed exposing what was felt to be L 45 level. Intraoperative x-ray demonstrated marker probe at L 45.  A hemi-semi-laminectomy of L 4 was performed a high-speed drill and completed with Kerrison rongeurs and a generous foraminotomy was performed overlying the superior aspect of the L 5 lamina on both sides . Ligamentum flavum was detached and removed in a piecemeal fashion and the L  5 nerve root was decompressed laterally with removal of the superior aspect of the facet and ligamentum causing nerve root compression. The microscope was brought into the field and the L 5 nerve root and thecal sac was mobilized medially on the left, exposing a huge ruptured disc. This exposed a large amount of soft disc material and  multiple fragments were removed and these extended into the interspace which appeared to be quite soft with a disrupted annulus overlying the interspace. As a result it was elected to further decompress the interspace and remove loose disc material and this was done with a variety of pituitary rongeurs. The redundant annulus was also removed with 2 mm Kerrison rongeur. After removing these multiple disc fragments from the left, we assessed the annulus on the right and there did not appear to be any additional disc herniation on this side.   At this point it was felt that all neural elements were well decompressed and there was no evidence of residual loose disc material within the interspace. The interspace was then irrigated with saline and no additional disc material was mobilized. Hemostasis was assured with bipolar electrocautery and Surgifoam and the interspace was irrigated with Depo-Medrol and fentanyl. The lumbodorsal fascia was closed with 0 Vicryl sutures the subcutaneous tissues reapproximated 2-0 Vicryl inverted sutures and the skin edges were reapproximated with 3-0 Vicryl subcuticular stitch. The wound is dressed with Dermabond and an occlusive dressing. Patient was extubated in the operating room and taken to recovery in stable and satisfactory condition having tolerated her operation well counts were   correct at the end of the case.   PLAN OF CARE: Admit to inpatient   PATIENT DISPOSITION:  PACU - hemodynamically stable.   Delay start of Pharmacological VTE agent (>24hrs) due to surgical blood loss or risk of bleeding: yes

## 2019-12-04 NOTE — Anesthesia Preprocedure Evaluation (Addendum)
Anesthesia Evaluation  Patient identified by MRN, date of birth, ID band Patient awake    Reviewed: Allergy & Precautions, NPO status , Patient's Chart, lab work & pertinent test results  Airway Mallampati: II  TM Distance: >3 FB Neck ROM: Full    Dental  (+) Teeth Intact, Dental Advisory Given   Pulmonary neg pulmonary ROS,    breath sounds clear to auscultation       Cardiovascular negative cardio ROS   Rhythm:Regular Rate:Normal     Neuro/Psych  Neuromuscular disease negative psych ROS   GI/Hepatic negative GI ROS, Neg liver ROS,   Endo/Other  negative endocrine ROS  Renal/GU negative Renal ROS     Musculoskeletal negative musculoskeletal ROS (+)   Abdominal Normal abdominal exam  (+)   Peds  Hematology negative hematology ROS (+)   Anesthesia Other Findings   Reproductive/Obstetrics                             Anesthesia Physical Anesthesia Plan  ASA: II and emergent  Anesthesia Plan: General   Post-op Pain Management:    Induction: Intravenous  PONV Risk Score and Plan: 4 or greater and Ondansetron, Dexamethasone, Midazolam and Scopolamine patch - Pre-op  Airway Management Planned: Oral ETT  Additional Equipment: None  Intra-op Plan:   Post-operative Plan: Extubation in OR  Informed Consent: I have reviewed the patients History and Physical, chart, labs and discussed the procedure including the risks, benefits and alternatives for the proposed anesthesia with the patient or authorized representative who has indicated his/her understanding and acceptance.     Dental advisory given  Plan Discussed with: CRNA  Anesthesia Plan Comments: (Piercing that can not be removed, accepted risk of burns and tears.   Covid-19 Nucleic Acid Test Results Lab Results      Component                Value               Date                      SARSCOV2NAA              NEGATIVE             12/04/2019                SARSCOV2NAA              NEGATIVE            04/26/2019           )       Anesthesia Quick Evaluation

## 2019-12-04 NOTE — ED Notes (Signed)
Pt on monitor for vitals

## 2019-12-04 NOTE — ED Provider Notes (Addendum)
[facility] EMERGENCY DEPARTMENT Provider Note   CSN: [id number] Arrival date & time: 12/04/19  6759     History Chief Complaint  Patient presents with  . Back Pain    Melanie Baldwin is a 34 y.o. female.  HPI Patient is a 34 year old female with a medical history as noted below.  Patient has been seen multiple times in the last few weeks with worsening low back pain.  She presents again today with similar complaints.  She reports bilateral lumbar pain that shifts from side to side based on her positioning.  She reports associated burning pain that radiates down her bilateral lower extremities, depending on which side of the lumbar spine her pain is occurring.  Reports associated decreased sensation across the groin as well as the lower extremities.  Reports increased urinary incontinence for the past few days stating that she has had to start wearing adult diapers.  No fevers, chills, abdominal pain, history of IVDA, history of cancer, night sweats.    Past Medical History:  Diagnosis Date  . Cystic fibrosis carrier   . Normal pregnancy 09/24/2010  . TB (pulmonary tuberculosis)    took med for 6 months currently negative    Patient Active Problem List   Diagnosis Date Noted  . Chronic bilateral low back pain with right-sided sciatica 11/15/2019  . Vaginal delivery 04/28/2019  . [redacted] weeks gestation of pregnancy 04/27/2019  . Post-dates pregnancy 04/26/2019  . Acute bilateral low back pain with left-sided sciatica 01/22/2019  . Delivery by vacuum extractor affecting fetus or newborn 09/25/2010  . SVD (spontaneous vaginal delivery) 09/24/2010    History reviewed. No pertinent surgical history.   OB History    Gravida  2   Para  2   Term  2   Preterm  0   AB  0   Living  2     SAB  0   TAB  0   Ectopic  0   Multiple  0   Live Births  2           Family History  Problem Relation Age of Onset  . Diabetes Paternal Grandmother     Social  History   Tobacco Use  . Smoking status: Never Smoker  . Smokeless tobacco: Never Used  Vaping Use  . Vaping Use: Never used  Substance Use Topics  . Alcohol use: No  . Drug use: No    Home Medications Prior to Admission medications   Medication Sig Start Date End Date Taking? Authorizing Provider  acetaminophen (TYLENOL) 325 MG tablet Take 2 tablets (650 mg total) by mouth every 4 (four) hours as needed (for pain scale < 4). 04/28/19   Sandre Kitty, MD  ibuprofen (ADVIL) 600 MG tablet Take 1 tablet (600 mg total) by mouth every 6 (six) hours as needed for moderate pain. 11/21/19   Fayrene Helper, PA-C  meloxicam (MOBIC) 15 MG tablet Take 15 mg by mouth daily as needed for pain.  10/31/19   [provider]  methocarbamol (ROBAXIN) 500 MG tablet Take 1 tablet (500 mg total) by mouth every 12 (twelve) hours as needed for muscle spasms. 11/21/19   Fayrene Helper, PA-C  predniSONE (DELTASONE) 20 MG tablet Take 2 tablets (40 mg total) by mouth daily. Take 40 mg by mouth daily for 3 days, then 20mg  by mouth daily for 3 days, then 10mg  daily for 3 days 11/21/19   , PA-C  traMADol (ULTRAM) 50 MG tablet  Take 50-100 mg by mouth 4 (four) times daily as needed. 11/19/19   [provider]    Allergies    Patient has no known allergies.  Review of Systems   Review of Systems  Constitutional: Negative for chills and fever.  Genitourinary: Positive for difficulty urinating.  Musculoskeletal: Positive for back pain.  Neurological: Positive for weakness and numbness.   Physical Exam Updated Vital Signs BP 125/87 (BP Location: Right Arm)   Pulse 97   Temp 99.2 F (37.3 C) (Oral)   Resp 18   Ht 5\' 4"  (1.626 m)   Wt 77.1 kg   LMP 11/10/2019   SpO2 98%   BMI 29.18 kg/m   Physical Exam Vitals and nursing note reviewed.  Constitutional:      General: She is not in acute distress.    Appearance: She is well-developed.  HENT:     Head: Normocephalic and atraumatic.       Right Ear: External ear normal.     Left Ear: External ear normal.  Eyes:     General: No scleral icterus.       Right eye: No discharge.        Left eye: No discharge.     Conjunctiva/sclera: Conjunctivae normal.  Neck:     Trachea: No tracheal deviation.  Cardiovascular:     Rate and Rhythm: Normal rate.  Pulmonary:     Effort: Pulmonary effort is normal. No respiratory distress.     Breath sounds: No stridor.  Abdominal:     General: There is no distension.  Musculoskeletal:        General: Tenderness present. No swelling or deformity.     Cervical back: Neck supple.     Right lower leg: No edema.     Left lower leg: No edema.     Comments: Mild tenderness noted across the right lumbar paraspinal musculature.  No midline lumbar spine tenderness.  Skin:    General: Skin is warm and dry.     Findings: No rash.  Neurological:     Mental Status: She is alert and oriented to person, place, and time.     Cranial Nerves: Cranial nerve deficit: no gross deficits.     Comments: Patient able to lift both legs off the bed and hold them in the air without assistance.  Sensation intact to soft touch but unable to differentiate between sharp and dull.  Mildly diminished but symmetrical strength with plantar flexion and dorsiflexion.    ED Results / Procedures / Treatments   Labs (all labs ordered are listed, but only abnormal results are displayed) Labs Reviewed - No data to display  EKG None  Radiology No results found.  Procedures Procedures (including critical care time)  Medications Ordered in ED Medications  dexamethasone (DECADRON) injection 10 mg (has no administration in time range)    ED Course  I have reviewed the triage vital signs and the nursing notes.  Pertinent labs & imaging results that were available during my care of the patient were reviewed by me and considered in my medical decision making (see chart for details).  Clinical Course as of Dec 03 1037  Tue Dec 04, 2019  [id number] 34 year old female with chronic back issues but for the last 2 months progressively worsening back pain numbness in her legs and incontinence of urine.  She is in acute urinary retention greater than 900 cc.  Nurses are going to in and out cath and we are  transferring her to [facility] for emergent MRI to rule out cauda equina.   [MB]    Clinical Course User Index [MB] Terrilee Files, MD   MDM Rules/Calculators/A&P                          Patient is a 34 year old female who presents to the emergency department with acute on chronic low back pain.  Patient endorses radicular pain down the bilateral lower extremities.  Difficulty urinating as well as recent urinary incontinence.  Bladder scan obtained by nursing staff which is showing greater than 999 cc in the bladder.  In and out cath performed resulting in greater than 1800 cc of urine.  Patient given IV dexamethasone and basic labs ordered.  Patient discussed with attending physician at [facility], Dr. Gerhard Munch.  Patient will be transferred to [facility] emergency department for emergent MRI of the lumbar spine.  This was discussed with the patient and she is amenable with this plan.  Final Clinical Impression(s) / ED Diagnoses Final diagnoses:  Acute bilateral low back pain with bilateral sciatica  Weakness  Urinary retention    Rx / DC Orders ED Discharge Orders    None       Placido Sou, PA-C 12/04/19 1044    Placido Sou, PA-C 12/04/19 1130    Placido Sou, PA-C 12/04/19 1204    Terrilee Files, MD 12/04/19 1906

## 2019-12-05 ENCOUNTER — Encounter (HOSPITAL_COMMUNITY): Payer: Self-pay | Admitting: Neurosurgery

## 2019-12-05 DIAGNOSIS — G834 Cauda equina syndrome: Secondary | ICD-10-CM | POA: Diagnosis present

## 2019-12-05 LAB — HIV ANTIBODY (ROUTINE TESTING W REFLEX): HIV Screen 4th Generation wRfx: NONREACTIVE

## 2019-12-05 MED ORDER — FLEET ENEMA 7-19 GM/118ML RE ENEM: 1.0000 | ENEMA | Freq: Once | RECTAL | Status: DC | PRN

## 2019-12-05 MED ORDER — MENTHOL 3 MG MT LOZG: 1.0000 | LOZENGE | OROMUCOSAL | Status: DC | PRN

## 2019-12-05 MED ORDER — METHOCARBAMOL 500 MG PO TABS: 500.0000 mg | ORAL_TABLET | Freq: Two times a day (BID) | ORAL | Status: DC | PRN

## 2019-12-05 MED ORDER — ACETAMINOPHEN 650 MG RE SUPP: 650.0000 mg | RECTAL | Status: DC | PRN

## 2019-12-05 MED ORDER — MELOXICAM 7.5 MG PO TABS
15.0000 mg | ORAL_TABLET | Freq: Every day | ORAL | Status: DC
  Administered 2019-12-05 – 2019-12-07 (×3): 15 mg via ORAL
  Filled 2019-12-05 (×3): qty 2

## 2019-12-05 MED ORDER — ZOLPIDEM TARTRATE 5 MG PO TABS: 5.0000 mg | ORAL_TABLET | Freq: Every evening | ORAL | Status: DC | PRN

## 2019-12-05 MED ORDER — CHLORHEXIDINE GLUCONATE CLOTH 2 % EX PADS
6.0000 | MEDICATED_PAD | Freq: Every day | CUTANEOUS | Status: DC
  Administered 2019-12-07: 6 via TOPICAL

## 2019-12-05 MED ORDER — HYDROMORPHONE HCL 1 MG/ML IJ SOLN: 0.5000 mg | INTRAMUSCULAR | Status: DC | PRN

## 2019-12-05 MED ORDER — ALUM & MAG HYDROXIDE-SIMETH 200-200-20 MG/5ML PO SUSP: 30.0000 mL | Freq: Four times a day (QID) | ORAL | Status: DC | PRN

## 2019-12-05 MED ORDER — ONDANSETRON HCL 4 MG/2ML IJ SOLN: 4.0000 mg | Freq: Four times a day (QID) | INTRAMUSCULAR | Status: DC | PRN

## 2019-12-05 MED ORDER — DOCUSATE SODIUM 100 MG PO CAPS
100.0000 mg | ORAL_CAPSULE | Freq: Two times a day (BID) | ORAL | Status: DC
  Administered 2019-12-05 – 2019-12-07 (×6): 100 mg via ORAL
  Filled 2019-12-05 (×6): qty 1

## 2019-12-05 MED ORDER — CEFAZOLIN SODIUM-DEXTROSE 2-4 GM/100ML-% IV SOLN
2.0000 g | Freq: Three times a day (TID) | INTRAVENOUS | Status: AC
  Administered 2019-12-05 (×2): 2 g via INTRAVENOUS
  Filled 2019-12-05 (×2): qty 100

## 2019-12-05 MED ORDER — PANTOPRAZOLE SODIUM 40 MG PO TBEC
40.0000 mg | DELAYED_RELEASE_TABLET | Freq: Every day | ORAL | Status: DC
  Administered 2019-12-05 – 2019-12-06 (×2): 40 mg via ORAL
  Filled 2019-12-05 (×2): qty 1

## 2019-12-05 MED ORDER — METHOCARBAMOL 1000 MG/10ML IJ SOLN
500.0000 mg | Freq: Four times a day (QID) | INTRAVENOUS | Status: DC | PRN
  Filled 2019-12-05: qty 5

## 2019-12-05 MED ORDER — ACETAMINOPHEN 325 MG PO TABS: 650.0000 mg | ORAL_TABLET | ORAL | Status: DC | PRN

## 2019-12-05 MED ORDER — SENNOSIDES-DOCUSATE SODIUM 8.6-50 MG PO TABS
1.0000 | ORAL_TABLET | Freq: Every evening | ORAL | Status: DC | PRN
  Administered 2019-12-06: 1 via ORAL
  Filled 2019-12-05: qty 1

## 2019-12-05 MED ORDER — HYDROCODONE-ACETAMINOPHEN 5-325 MG PO TABS
2.0000 | ORAL_TABLET | ORAL | Status: DC | PRN
  Administered 2019-12-05 – 2019-12-06 (×9): 2 via ORAL
  Filled 2019-12-05 (×9): qty 2

## 2019-12-05 MED ORDER — OXYCODONE HCL 5 MG PO TABS: 5.0000 mg | ORAL_TABLET | ORAL | Status: DC | PRN

## 2019-12-05 MED ORDER — PHENOL 1.4 % MT LIQD: 1.0000 | OROMUCOSAL | Status: DC | PRN

## 2019-12-05 MED ORDER — SODIUM CHLORIDE 0.9 % IV SOLN: 250.0000 mL | INTRAVENOUS | Status: DC

## 2019-12-05 MED ORDER — IBUPROFEN 200 MG PO TABS: 600.0000 mg | ORAL_TABLET | Freq: Four times a day (QID) | ORAL | Status: DC | PRN

## 2019-12-05 MED ORDER — SODIUM CHLORIDE 0.9% FLUSH: 3.0000 mL | INTRAVENOUS | Status: DC | PRN

## 2019-12-05 MED ORDER — SODIUM CHLORIDE 0.9% FLUSH
3.0000 mL | Freq: Two times a day (BID) | INTRAVENOUS | Status: DC
  Administered 2019-12-05 – 2019-12-06 (×4): 3 mL via INTRAVENOUS

## 2019-12-05 MED ORDER — TRAMADOL HCL 50 MG PO TABS: 50.0000 mg | ORAL_TABLET | Freq: Four times a day (QID) | ORAL | Status: DC | PRN

## 2019-12-05 MED ORDER — PANTOPRAZOLE SODIUM 40 MG IV SOLR: 40.0000 mg | Freq: Every day | INTRAVENOUS | Status: DC

## 2019-12-05 MED ORDER — KCL IN DEXTROSE-NACL 20-5-0.45 MEQ/L-%-% IV SOLN
INTRAVENOUS | Status: DC
  Filled 2019-12-05: qty 1000

## 2019-12-05 MED ORDER — BISACODYL 10 MG RE SUPP: 10.0000 mg | Freq: Every day | RECTAL | Status: DC | PRN

## 2019-12-05 MED ORDER — METHOCARBAMOL 500 MG PO TABS
500.0000 mg | ORAL_TABLET | Freq: Four times a day (QID) | ORAL | Status: DC | PRN
  Administered 2019-12-05 – 2019-12-06 (×4): 500 mg via ORAL
  Filled 2019-12-05 (×4): qty 1

## 2019-12-05 MED ORDER — ONDANSETRON HCL 4 MG PO TABS: 4.0000 mg | ORAL_TABLET | Freq: Four times a day (QID) | ORAL | Status: DC | PRN

## 2019-12-05 NOTE — Anesthesia Postprocedure Evaluation (Signed)
Anesthesia Post Note  Patient: Melanie Baldwin  Procedure(s) Performed: Bilateral Lumbar Four-Five Microdiscectomy (Bilateral )     Patient location during evaluation: PACU Anesthesia Type: General Level of consciousness: awake and alert Pain management: pain level controlled Vital Signs Assessment: post-procedure vital signs reviewed and stable Respiratory status: spontaneous breathing, nonlabored ventilation, respiratory function stable and patient connected to nasal cannula oxygen Cardiovascular status: blood pressure returned to baseline and stable Postop Assessment: no apparent nausea or vomiting Anesthetic complications: no   No complications documented.  Last Vitals:  Vitals:   12/05/19 0502 12/05/19 0806  BP: 131/64 110/62  Pulse: 95 93  Resp: 18 16  Temp: 36.9 C 36.8 C  SpO2: 99% 100%    Last Pain:  Vitals:   12/05/19 0806  TempSrc: Oral  PainSc:                  Shelton Silvas

## 2019-12-05 NOTE — Progress Notes (Signed)
Subjective: Patient reports that she is doing good overall. Some intermittent low back pain but much improved. She does report that her BLE strength has slightly improved and is now able to sit on the side of the bed. However, she continues to be unable to ambulate and has BLE paraesthesia.   Objective: Vital signs in last 24 hours: Temp:  [97.2 F (36.2 C)-99.2 F (37.3 C)] 98.2 F (36.8 C) (11/10 0806) Pulse Rate:  [71-108] 93 (11/10 0806) Resp:  [15-23] 16 (11/10 0806) BP: (108-140)/(62-109) 110/62 (11/10 0806) SpO2:  [94 %-100 %] 100 % (11/10 0806) Weight:  [77.1 kg] 77.1 kg (11/09 0935)  Intake/Output from previous day: 11/09 0701 - 11/10 0700 In: 1400 [I.V.:1400] Out: 3005 [Urine:2995; Blood:10] Intake/Output this shift: No intake/output data recorded.  Physical Exam: Patient is in NAD, A/O X , and is conversant. BLE strength has improved. Left EHL 4+/5, Right EHL 4/5. Bilateral dorsiflexion 4/5. She continues to have BLE paraesthesia but has slightly improved. Saddle anesthesia persists. Dressing is CDI.   Lab Results: Recent Labs    12/04/19 1106  WBC 15.6*  HGB 13.0  HCT 40.5  PLT 227   BMET Recent Labs    12/04/19 1106  NA 136  K 4.0  CL 102  CO2 23  GLUCOSE 136*  BUN 19  CREATININE 0.62  CALCIUM 9.2    Studies/Results: DG Lumbar Spine 2-3 Views  Result Date: 12/04/2019 CLINICAL DATA:  L4-L5 micro discectomy EXAM: LUMBAR SPINE - 2-3 VIEW COMPARISON:  Radiograph 11/21/2019, MRI 12/04/2019 FINDINGS: Pointed surgical implement is directed towards the L4-5 facet with retractor posterior to the disc space as well. Redemonstration of a partially sacralized L5 vertebral body. No acute osseous abnormalities. Expected surgical soft tissue changes are seen. IMPRESSION: Localization of the L4-5 facet. Electronically Signed   By: Kreg Shropshire M.D.   On: 12/04/2019 22:49   MR LUMBAR SPINE WO CONTRAST  Result Date: 12/04/2019 CLINICAL DATA:  Low back pain, cauda  equina syndrome suspected. Urinary incontinence. EXAM: MRI LUMBAR SPINE WITHOUT CONTRAST TECHNIQUE: Multiplanar, multisequence MR imaging of the lumbar spine was performed. No intravenous contrast was administered. COMPARISON:  Lumbar radiographs 11/01/2019 FINDINGS: Segmentation: L5 is partially incorporated into the sacrum. Transverse processes of L5 articulate with the sacrum. Hypoplastic L5-S1 disc space. Alignment:  Normal Vertebrae:  Normal bone marrow.  Negative for fracture or mass. Conus medullaris and cauda equina: Conus extends to the L1-2 level. Conus and cauda equina appear normal. Paraspinal and other soft tissues: Negative Disc levels: L1-2: Negative L2-3: Negative L3-4: Negative L4-5: Large extruded disc fragment compressing the thecal sac with severe spinal stenosis. Mild subarticular stenosis bilaterally. Mild facet degeneration L5-S1: Hypoplastic disc space without stenosis IMPRESSION: L5 is partially incorporated into the sacrum Large extruded disc fragment in the midline at L4-5 with compression of thecal sac and severe spinal stenosis. Electronically Signed   By: Marlan Palau M.D.   On: 12/04/2019 13:56    Assessment/Plan: Patient is post op day 1 s/p bilateral L4/5 microdiscectomy. She is recovering well and has a slight improvement in her BLE weakness and numbness but remains unable to ambulate. Continue monitoring patient for urinary retention. Begin working with therapies. Will likely need CIR after discharge due to cauda equina syndrome.   LOS: 1 day     Council Mechanic 12/05/2019, 8:18 AM

## 2019-12-05 NOTE — Progress Notes (Signed)
Inpatient Rehabilitation-Admissions Coordinator   Inpatient Rehab Admissions:  Inpatient Rehab Consult received.  I met with pt at the bedside for rehabilitation assessment and to discuss goals and expectations of an inpatient rehab admission. Pt appears to be a great candidate for CIR. Confirmed DC support with pt's husband. Both are interested in this program. [facility] will begin insurance auth process for possible admit. Will update as soon as I have a determination.   Raechel Ache, OTR/L  Rehab Admissions Coordinator  [phone] 12/05/2019 4:42 PM

## 2019-12-05 NOTE — Progress Notes (Signed)
Pt's Foley was D/C'd this AM. Pt unable to void, bladder scanned performed and >500cc was shown. Pt has no sensation and has no urge to void. Foley was placed and 700cc of clear yellow urine returned. Will continue to monitor Pt. Rema Fendt, RN

## 2019-12-05 NOTE — Evaluation (Signed)
Physical Therapy Evaluation Patient Details Name: Melanie Baldwin MRN: [id number] DOB: Aug 02, 1985 Today's Date: 12/05/2019   History of Present Illness  This 34 y.o. admitted with cauda equina syndrome and underwent L4-5 microdiscectomy.  PMH non contributory   Clinical Impression  Patient is s/p above surgery resulting in functional limitations due to the deficits listed below (see PT Problem List). Comes from home where she lives with her husband, and 2 daughters (ages 84yo and 71mos), in a single level apartment with one steps to enter; Completely independent until approx 2 months ago, when back pain started and then severely limited her mobility, independence with ADLs, and her ability to care for her infant daughter; Presents to PT with Bil LE weakness, dyscoordination bil LEs, sensation changes and paresthesias Bil LEs, incr fall risk, and functional dependencies; She is young, very motivated to return to her own independence and then to go further and be able to care for her daughters; Recommend CIR for intensive therapies to help reach her functional and caregiving goals;  Patient will benefit from skilled PT to increase their independence and safety with mobility to allow discharge to the venue listed below.       Follow Up Recommendations CIR    Equipment Recommendations  Rolling walker with 5" wheels;3in1 (PT)    Recommendations for Other Services Rehab consult     Precautions / Restrictions Precautions Precautions: Back Precaution Comments: Pt verbally instructed in back precautions       Mobility  Bed Mobility Overal bed mobility: Needs Assistance Bed Mobility: Sit to Sidelying         Sit to sidelying: Mod assist General bed mobility comments: Cues for technique, and light mod assist to help feet into bed    Transfers Overall transfer level: Needs assistance Equipment used: Rolling walker (2 wheeled) Transfers: Sit to/from [employer] Sit to  Stand: Min assist;+2 physical assistance Stand pivot transfers: Min assist;+2 safety/equipment       General transfer comment: Pt required assist to boost into standing and assist to steady   Ambulation/Gait Ambulation/Gait assistance: Min assist;+2 safety/equipment (close guard and chair push) Gait Distance (Feet):  (Hallway ambulation) Assistive device: Rolling walker (2 wheeled) Gait Pattern/deviations: Step-through pattern;Decreased step length - right;Decreased step length - left;Decreased stride length;Ataxic;Decreased dorsiflexion - right (decr step height) Gait velocity: quie slow   General Gait Details: Pt quite nervous about walking, and very dependent on RW for support; dyscoordinated steps, with tendency for R footfall on lateral edge of foot; cues for RW use and proximity, and to try to relax shoulders away from ears; Notable tremor with incr amb distance  Stairs            Wheelchair Mobility    Modified Rankin (Stroke Patients Only)       Balance Overall balance assessment: Needs assistance Sitting-balance support: Feet supported Sitting balance-Leahy Scale: Fair     Standing balance support: Bilateral upper extremity supported Standing balance-Leahy Scale: Poor Standing balance comment: reliant on UE support                              Pertinent Vitals/Pain Pain Assessment: 0-10 Pain Score: 3  Pain Location: Rt LE and back  Pain Descriptors / Indicators: Operative site guarding;Guarding Pain Intervention(s): Premedicated before session    Home Living Family/patient expects to be discharged to:: Private residence Living Arrangements: Spouse/significant other;Children (1 daughter 9yo, one daughter 80mos) Available Help at Discharge:  Family;Available PRN/intermittently Type of Home: Apartment Home Access: Stairs to enter Entrance Stairs-Rails: None Entrance Stairs-Number of Steps: 1 Home Layout: One level Home Equipment:  None Additional Comments: lives with spouse, who works during the day, 66 mos old and 39 y.o. daughters     Prior Function Level of Independence: Needs assistance   Gait / Transfers Assistance Needed: Pt reports she has been limited with her ability to ambulate and has been holding onto walls and furniture for ~2 mos PTA   ADL's / Homemaking Assistance Needed: Pt reports spouse performed IADLs and child care tasks for the past 2 mos due to pain and weakness         Hand Dominance   Dominant Hand: Right    Extremity/Trunk Assessment   Upper Extremity Assessment Upper Extremity Assessment: Defer to OT evaluation    Lower Extremity Assessment Lower Extremity Assessment: RLE deficits/detail;LLE deficits/detail RLE Deficits / Details: strength 3/5 hip flexors, quadriceps, ankle dorsiflexion; paresthesias, and feeling like LEs are heavy RLE Sensation: decreased light touch;decreased proprioception RLE Coordination: decreased gross motor;decreased fine motor LLE Deficits / Details: strength 3-/5 hip flexors, quadriceps, ankle dorsiflexion; paresthesias, legs feel heavy LLE Sensation: decreased light touch;decreased proprioception LLE Coordination: decreased gross motor    Cervical / Trunk Assessment Cervical / Trunk Assessment: Other exceptions Cervical / Trunk Exceptions: Saddle paresthesias  Communication   Communication: No difficulties  Cognition Arousal/Alertness: Awake/alert Behavior During Therapy: WFL for tasks assessed/performed (Shed a few tears while discussing current home situation) Overall Cognitive Status: Within Functional Limits for tasks assessed                                        General Comments General comments (skin integrity, edema, etc.): Pt is very motivated to regain her independence     Exercises     Assessment/Plan    PT Assessment Patient needs continued PT services  PT Problem List Decreased strength;Decreased range of  motion;Decreased activity tolerance;Decreased balance;Decreased mobility;Decreased coordination;Decreased knowledge of use of DME;Decreased safety awareness;Decreased knowledge of precautions;Impaired sensation       PT Treatment Interventions DME instruction;Gait training;Stair training;Functional mobility training;Therapeutic activities;Therapeutic exercise;Balance training;Neuromuscular re-education;Cognitive remediation;Patient/family education    PT Goals (Current goals can be found in the Care Plan section)  Acute Rehab PT Goals Patient Stated Goal: to be able to take care of daughter  PT Goal Formulation: With patient Time For Goal Achievement: 12/19/19 Potential to Achieve Goals: Good    Frequency Min 5X/week   Barriers to discharge        Co-evaluation PT/OT/SLP Co-Evaluation/Treatment: Yes (semi-dovetail) Reason for Co-Treatment: For patient/therapist safety;To address functional/ADL transfers PT goals addressed during session: Mobility/safety with mobility OT goals addressed during session: ADL's and self-care       AM-PAC PT "6 Clicks" Mobility  Outcome Measure Help needed turning from your back to your side while in a flat bed without using bedrails?: A Little Help needed moving from lying on your back to sitting on the side of a flat bed without using bedrails?: A Little Help needed moving to and from a bed to a chair (including a wheelchair)?: A Lot Help needed standing up from a chair using your arms (e.g., wheelchair or bedside chair)?: A Lot Help needed to walk in hospital room?: A Lot Help needed climbing 3-5 steps with a railing? : A Lot 6 Click Score: 14    End  of Session   Activity Tolerance: Patient tolerated treatment well Patient left: in bed;with call bell/phone within reach Nurse Communication: Mobility status PT Visit Diagnosis: Unsteadiness on feet (R26.81);Other abnormalities of gait and mobility (R26.89);Muscle weakness (generalized)  (M62.81);Ataxic gait (R26.0)    Time: 1165-7903 PT Time Calculation (min) (ACUTE ONLY): 27 min   Charges:   PT Evaluation $PT Eval Moderate Complexity: 1 Mod          Van Clines, [place]  Acute Rehabilitation Services Pager [phone] Office [phone]   Levi Aland 12/05/2019, 12:11 PM

## 2019-12-05 NOTE — Evaluation (Signed)
Occupational Therapy Evaluation Patient Details Name: Melanie Baldwin MRN: [id number] DOB: 1985/07/21 Today's Date: 12/05/2019    History of Present Illness This 34 y.o. admitted with cauda equina syndrome and underwent L4-5 microdiscectomy.  PMH non contributory    Clinical Impression   Pt admitted with above. She demonstrates the below listed deficits and will benefit from continued OT to maximize safety and independence with BADLs.  Pt presents to OT with LE weakness, impaired sensation, increased pain, impaired balance.  She currently requires set up assist for UB ADLs, and max A - total A for LB ADLs, and min A +2 for functional mobility using RW.  Pt lives with her spouse and two daughters, ages 8 mos and 28 y.o.  She reports decreased independence with ADLs and IADLs for 2 mos PTA due to back pain.  She is very motivated to regain independence.  Recommend CIR level rehab at discharge to allow her to progress to mod I level for ADLs and be able to safely care for her children with intermittent assist.  Will follow.       Follow Up Recommendations  CIR    Equipment Recommendations  3 in 1 bedside commode;Tub/shower seat    Recommendations for Other Services Rehab consult     Precautions / Restrictions Precautions Precautions: Back Precaution Comments: Pt verbally instructed in back precautions       Mobility Bed Mobility Overal bed mobility: Needs Assistance Bed Mobility: Sit to Sidelying         Sit to sidelying: Mod assist General bed mobility comments: Cues for technique, and light mod assist to help feet into bed    Transfers Overall transfer level: Needs assistance Equipment used: Rolling walker (2 wheeled) Transfers: Sit to/from [employer] Sit to Stand: Min assist;+2 physical assistance Stand pivot transfers: Min assist;+2 safety/equipment       General transfer comment: Pt required assist to boost into standing and assist to steady      Balance Overall balance assessment: Needs assistance Sitting-balance support: Feet supported Sitting balance-Leahy Scale: Fair     Standing balance support: Bilateral upper extremity supported Standing balance-Leahy Scale: Poor Standing balance comment: reliant on UE support                            ADL either performed or assessed with clinical judgement   ADL Overall ADL's : Needs assistance/impaired Eating/Feeding: Independent   Grooming: Wash/dry hands;Wash/dry face;Oral care;Brushing hair;Set up;Sitting   Upper Body Bathing: Set up;Sitting   Lower Body Bathing: Maximal assistance;Sit to/from stand   Upper Body Dressing : Set up;Sitting   Lower Body Dressing: Total assistance;Sit to/from stand   Toilet Transfer: Minimal assistance;+2 for physical assistance;Stand-pivot;BSC;Ambulation;RW   Toileting- Clothing Manipulation and Hygiene: Maximal assistance;Sit to/from stand Toileting - Clothing Manipulation Details (indicate cue type and reason): Pt with impaired bowel and bladder function      Functional mobility during ADLs: Minimal assistance;+2 for safety/equipment;Rolling walker       Vision         Perception     Praxis      Pertinent Vitals/Pain Pain Assessment: 0-10 Pain Score: 3  Pain Location: Rt LE and back  Pain Descriptors / Indicators: Operative site guarding;Guarding Pain Intervention(s): Premedicated before session     Hand Dominance Right   Extremity/Trunk Assessment Upper Extremity Assessment Upper Extremity Assessment: Defer to OT evaluation   Lower Extremity Assessment Lower Extremity Assessment: RLE deficits/detail;LLE deficits/detail  RLE Deficits / Details: strength 3/5 hip flexors, quadriceps, ankle dorsiflexion; paresthesias, and feeling like LEs are heavy RLE Sensation: decreased light touch;decreased proprioception RLE Coordination: decreased gross motor;decreased fine motor LLE Deficits / Details: strength 3-/5  hip flexors, quadriceps, ankle dorsiflexion; paresthesias, legs feel heavy LLE Sensation: decreased light touch;decreased proprioception LLE Coordination: decreased gross motor   Cervical / Trunk Assessment Cervical / Trunk Assessment: Other exceptions   Communication Communication Communication: No difficulties   Cognition Arousal/Alertness: Awake/alert Behavior During Therapy: WFL for tasks assessed/performed (Shed a few tears while discussing current home situation) Overall Cognitive Status: Within Functional Limits for tasks assessed                                     General Comments  Pt is very motivated to regain her independence     Exercises     Shoulder Instructions      Home Living Family/patient expects to be discharged to:: Private residence Living Arrangements: Spouse/significant other;Children (1 daughter 9yo, one daughter 4mos) Available Help at Discharge: Family;Available PRN/intermittently Type of Home: Apartment Home Access: Stairs to enter Entrance Stairs-Number of Steps: 1 Entrance Stairs-Rails: None Home Layout: One level     Bathroom Shower/Tub: Tub/shower unit         Home Equipment: None   Additional Comments: lives with spouse, who works during the day, 48 mos old and 40 y.o. daughters       Prior Functioning/Environment Level of Independence: Needs assistance  Gait / Transfers Assistance Needed: Pt reports she has been limited with her ability to ambulate and has been holding onto walls and furniture for ~2 mos PTA  ADL's / Homemaking Assistance Needed: Pt reports spouse performed IADLs and child care tasks for the past 2 mos due to pain and weakness             OT Problem List: Decreased strength;Decreased activity tolerance;Impaired balance (sitting and/or standing);Decreased safety awareness;Decreased knowledge of use of DME or AE;Decreased knowledge of precautions;Pain      OT Treatment/Interventions: Self-care/ADL  training;DME and/or AE instruction;Therapeutic activities;Patient/family education;Balance training    OT Goals(Current goals can be found in the care plan section) Acute Rehab OT Goals Patient Stated Goal: to be able to take care of daughter  OT Goal Formulation: With patient Time For Goal Achievement: 12/19/19 Potential to Achieve Goals: Good ADL Goals Pt Will Perform Grooming: (P) with min guard assist;standing Pt Will Perform Lower Body Bathing: (P) with min guard assist;sit to/from stand;with adaptive equipment Pt Will Perform Lower Body Dressing: (P) with min guard assist;with adaptive equipment;sit to/from stand Pt Will Transfer to Toilet: (P) with min guard assist;ambulating;regular height toilet;bedside commode;grab bars Pt Will Perform Toileting - Clothing Manipulation and hygiene: (P) with min guard assist;sit to/from stand;with adaptive equipment  OT Frequency: Min 2X/week   Barriers to D/C:            Co-evaluation PT/OT/SLP Co-Evaluation/Treatment: Yes Reason for Co-Treatment: For patient/therapist safety;To address functional/ADL transfers PT goals addressed during session: Mobility/safety with mobility OT goals addressed during session: ADL's and self-care      AM-PAC OT "6 Clicks" Daily Activity     Outcome Measure Help from another person eating meals?: None Help from another person taking care of personal grooming?: A Little Help from another person toileting, which includes using toliet, bedpan, or urinal?: A Lot Help from another person bathing (including washing, rinsing, drying)?: A  Lot Help from another person to put on and taking off regular upper body clothing?: A Little Help from another person to put on and taking off regular lower body clothing?: Total 6 Click Score: 15   End of Session Equipment Utilized During Treatment: Rolling walker Nurse Communication: Mobility status  Activity Tolerance: Patient tolerated treatment well Patient left: in  bed;with call bell/phone within reach  OT Visit Diagnosis: Unsteadiness on feet (R26.81);Pain Pain - Right/Left: Right Pain - part of body: Hip;Leg                Time: 7425-9563 OT Time Calculation (min): 27 min Charges:  OT General Charges $OT Visit: 1 Visit OT Evaluation $OT Eval Moderate Complexity: 1 Mod  Eber Jones., OTR/L Acute Rehabilitation Services Pager [phone] Office [phone]   Jeani Hawking M 12/05/2019, 12:03 PM

## 2019-12-06 MED ORDER — OXYCODONE HCL 5 MG PO TABS: 5.0000 mg | ORAL_TABLET | ORAL | Status: DC | PRN

## 2019-12-06 NOTE — Progress Notes (Addendum)
Subjective: Patient reports "My legs are a little stronger...still some numbness, but I can feel you touch my foot"  Objective: Vital signs in last 24 hours: Temp:  [98 F (36.7 C)-98.3 F (36.8 C)] 98 F (36.7 C) (11/11 0734) Pulse Rate:  [73-98] 95 (11/11 0734) Resp:  [16-20] 18 (11/11 0734) BP: (93-120)/(52-78) 112/78 (11/11 0734) SpO2:  [98 %-100 %] 100 % (11/11 0734)  Intake/Output from previous day: 11/10 0701 - 11/11 0700 In: -  Out: 1100 [Urine:1100] Intake/Output this shift: No intake/output data recorded.  Alert, conversant, sitting in chair. Husband present and appropriately attentive. Ambulated with PT with walker and SBA. Pt notes strength is better, but numbness makes walking difficult. She required reinsertion of Foley for retention. Saddle anesthesia persists. Incision flat without erythema swelling or drainage beneath honeycomb and dermabond.  Lab Results: Recent Labs    12/04/19 1106  WBC 15.6*  HGB 13.0  HCT 40.5  PLT 227   BMET Recent Labs    12/04/19 1106  NA 136  K 4.0  CL 102  CO2 23  GLUCOSE 136*  BUN 19  CREATININE 0.62  CALCIUM 9.2    Studies/Results: DG Lumbar Spine 2-3 Views  Result Date: 12/04/2019 CLINICAL DATA:  L4-L5 micro discectomy EXAM: LUMBAR SPINE - 2-3 VIEW COMPARISON:  Radiograph 11/21/2019, MRI 12/04/2019 FINDINGS: Pointed surgical implement is directed towards the L4-5 facet with retractor posterior to the disc space as well. Redemonstration of a partially sacralized L5 vertebral body. No acute osseous abnormalities. Expected surgical soft tissue changes are seen. IMPRESSION: Localization of the L4-5 facet. Electronically Signed   By: Kreg Shropshire M.D.   On: 12/04/2019 22:49   MR LUMBAR SPINE WO CONTRAST  Result Date: 12/04/2019 CLINICAL DATA:  Low back pain, cauda equina syndrome suspected. Urinary incontinence. EXAM: MRI LUMBAR SPINE WITHOUT CONTRAST TECHNIQUE: Multiplanar, multisequence MR imaging of the lumbar spine was  performed. No intravenous contrast was administered. COMPARISON:  Lumbar radiographs 11/01/2019 FINDINGS: Segmentation: L5 is partially incorporated into the sacrum. Transverse processes of L5 articulate with the sacrum. Hypoplastic L5-S1 disc space. Alignment:  Normal Vertebrae:  Normal bone marrow.  Negative for fracture or mass. Conus medullaris and cauda equina: Conus extends to the L1-2 level. Conus and cauda equina appear normal. Paraspinal and other soft tissues: Negative Disc levels: L1-2: Negative L2-3: Negative L3-4: Negative L4-5: Large extruded disc fragment compressing the thecal sac with severe spinal stenosis. Mild subarticular stenosis bilaterally. Mild facet degeneration L5-S1: Hypoplastic disc space without stenosis IMPRESSION: L5 is partially incorporated into the sacrum Large extruded disc fragment in the midline at L4-5 with compression of thecal sac and severe spinal stenosis. Electronically Signed   By: Marlan Palau M.D.   On: 12/04/2019 13:56    Assessment/Plan: stable  LOS: 2 days  Awaiting insurance approval for CIR.    Georgiann Cocker 12/06/2019, 11:17 AM  Patient is awaiting Rehab.

## 2019-12-06 NOTE — Progress Notes (Signed)
Occupational Therapy Treatment Patient Details Name: Melanie Baldwin MRN: [id number] DOB: [birth year] Today's Date: 12/06/2019    History of present illness This 34 y.o. admitted with cauda equina syndrome and underwent L4-5 microdiscectomy.  PMH non contributory    OT comments  Patient continues to make nice progress with functional mobility, requires less support.  She still presents with significant impairments to LB ADL compared to her stated prior level of function.  She would benefit from continued intensive rehab to regain her independence and transition back home safely.  OT will continue to follow her in the acute setting.  CIR has been recommended.  Given her age and prior level of function, this would be an excellent option for her.    Follow Up Recommendations  CIR;Supervision/Assistance - 24 hour    Equipment Recommendations  3 in 1 bedside commode;Tub/shower seat    Recommendations for Other Services Rehab consult    Precautions / Restrictions Precautions Precautions: Back Precaution Comments: cueing for back precautions. Restrictions Weight Bearing Restrictions: No Other Position/Activity Restrictions: no brace needed per order set       Mobility Bed Mobility Overal bed mobility: Needs Assistance   Rolling: Min assist Sidelying to sit: Mod assist          Transfers Overall transfer level: Needs assistance Equipment used: Rolling walker (2 wheeled) Transfers: Sit to/from [employer] Transfers Sit to Stand: Min guard Stand pivot transfers: Min guard            Balance Overall balance assessment: Needs assistance Sitting-balance support: Feet supported;Bilateral upper extremity supported Sitting balance-Leahy Scale: Good     Standing balance support: Bilateral upper extremity supported Standing balance-Leahy Scale: Poor                             ADL either performed or assessed with clinical judgement   ADL        Grooming: Wash/dry hands;Wash/dry face;Oral care;Brushing hair;Set up;Sitting           Upper Body Dressing : Set up;Sitting   Lower Body Dressing: Moderate assistance;Sit to/from stand   Toilet Transfer: Minimal assistance;RW           Functional mobility during ADLs: Min guard;Rolling walker                             Pertinent Vitals/ Pain       Pain Assessment: No/denies pain Pain Intervention(s): Monitored during session                                                          Frequency  Min 2X/week        Progress Toward Goals  OT Goals(current goals can now be found in the care plan section)  Progress towards OT goals: Progressing toward goals  Acute Rehab OT Goals Patient Stated Goal: to be able to take care of daughter  OT Goal Formulation: With patient Time For Goal Achievement: 12/19/19 Potential to Achieve Goals: Good  Plan Discharge plan remains appropriate    Co-evaluation                 AM-PAC OT "6 Clicks" Daily Activity     Outcome Measure  Help from another person eating meals?: None Help from another person taking care of personal grooming?: A Little Help from another person toileting, which includes using toliet, bedpan, or urinal?: A Little Help from another person bathing (including washing, rinsing, drying)?: A Lot Help from another person to put on and taking off regular upper body clothing?: A Little Help from another person to put on and taking off regular lower body clothing?: A Lot 6 Click Score: 17    End of Session Equipment Utilized During Treatment: Rolling walker  OT Visit Diagnosis: Unsteadiness on feet (R26.81);Pain   Activity Tolerance Patient tolerated treatment well   Patient Left in chair;with call bell/phone within reach   Nurse Communication Other (comment) (up in chair)        Time: 1308-6578 OT Time Calculation (min): 15 min  Charges: OT General  Charges $OT Visit: 1 Visit OT Treatments $Self Care/Home Management : 8-22 mins  12/06/2019  Rich, OTR/L  Acute Rehabilitation Services  Office:  [phone]    Melanie Baldwin 12/06/2019, 3:59 PM

## 2019-12-06 NOTE — PMR Pre-admission (Signed)
PMR Admission Coordinator Pre-Admission Assessment  Patient: Melanie Baldwin is an 34 y.o., female MRN: [id number] DOB: [birth year] Height: '5\' 4"'  (162.6 cm) Weight: 77.1 kg  Insurance Information HMO:    PPO: yes     PCP:      IPA:      80/20:      OTHER:  PRIMARY: [place]       Policy#: [id number]      Subscriber: patient CM Name: Azel J.      Phone#: [phone]     Fax#: [phone] Pre-Cert#: [id number]      Employer:  Josem Kaufmann provided by Maryjane Hurter. For admit to CIR. Pt is approved for 8 days from 12/07/19-12/14/19 and next review date is 12/17/19; clinicals to be sent to (f): [phone] Benefits:  Phone #: [phone]     Name:  Eff. Date: 07/26/2019 - still active     Deduct: $0 -does not have      Out of Pocket Max: $0 - does not have      Life Max: NA CIR: $0 co-pay      SNF: $0 co-pay; limited by medical necessity review Outpatient: $3 co-pay     Home Health: $0 co-pay; limited by medical necessity review   DME: $0 co-pay; limited by medical necessity reivew  Providers:  SECONDARY: None      Policy#:      Phone#:   Financial Counselor:       Phone#:   The Therapist, art Information Summary" for patients in Inpatient Rehabilitation Facilities with attached "Privacy Act [place] Records" was provided and verbally reviewed with: NA  Emergency Contact Information Contact Information    Name Relation Home Work Mobile   Dahal,Kharka Spouse [phone]        Current Medical History  Patient Admitting Diagnosis: cauda equina syndrome   History of Present Illness: Pt is a 34 yo Female who presented with worsening back pain, BLE radiculopathy, saddle anesthesia and urinary retention. MRI showed a large midline herniated disc at L4/5 level with severe spinal stenosis and compression of the thecal sac. Pt was admitted to [facility] on 12/04/19 and underwent emergent microdiscectomies at L4/L5 level and resection of the ruptured lumbar disc. Pt seen by therapies and  recommended for CIR due to persistent saddle anesthesia, urinary retention, and functional ambulation deficits. Pt is to admit to CIR 12/07/19.     Patient's medical record from [facility] has been reviewed by the rehabilitation admission coordinator and physician.  Past Medical History  Past Medical History:  Diagnosis Date  . Cystic fibrosis carrier   . Normal pregnancy 09/24/2010  . TB (pulmonary tuberculosis)    took med for 6 months currently negative    Family History   family history includes Diabetes in her paternal grandmother.  Prior Rehab/Hospitalizations Has the patient had prior rehab or hospitalizations prior to admission? Yes  Has the patient had major surgery during 100 days prior to admission? Yes   Current Medications  Current Facility-Administered Medications:  .  0.9 %  sodium chloride infusion, 250 mL, Intravenous, Continuous, Erline Levine, MD .  acetaminophen (TYLENOL) tablet 650 mg, 650 mg, Oral, Q4H PRN **OR** acetaminophen (TYLENOL) suppository 650 mg, 650 mg, Rectal, Q4H PRN, Erline Levine, MD .  alum & mag hydroxide-simeth (MAALOX/MYLANTA) 200-200-20 MG/5ML suspension 30 mL, 30 mL, Oral, Q6H PRN, Erline Levine, MD .  bisacodyl (DULCOLAX) suppository 10 mg, 10 mg, Rectal, Daily PRN, Erline Levine, MD .  Chlorhexidine Gluconate  Cloth 2 % PADS 6 each, 6 each, Topical, Daily, Erline Levine, MD, 6 each at 12/07/19 1002 .  dextrose 5 % and 0.45 % NaCl with KCl 20 mEq/L infusion, , Intravenous, Continuous, Erline Levine, MD, Last Rate: 75 mL/hr at 12/05/19 0215, New Bag at 12/05/19 0215 .  docusate sodium (COLACE) capsule 100 mg, 100 mg, Oral, BID, Erline Levine, MD, 100 mg at 12/07/19 1002 .  HYDROcodone-acetaminophen (NORCO/VICODIN) 5-325 MG per tablet 1-2 tablet, 1-2 tablet, Oral, Q4H PRN, Erline Levine, MD .  HYDROmorphone (DILAUDID) injection 0.5 mg, 0.5 mg, Intravenous, Q2H PRN, Erline Levine, MD .  meloxicam [facility]) tablet 15 mg, 15 mg,  Oral, Daily, Erline Levine, MD, 15 mg at 12/07/19 1000 .  menthol-cetylpyridinium (CEPACOL) lozenge 3 mg, 1 lozenge, Oral, PRN **OR** phenol (CHLORASEPTIC) mouth spray 1 spray, 1 spray, Mouth/Throat, PRN, Erline Levine, MD .  methocarbamol (ROBAXIN) tablet 500 mg, 500 mg, Oral, Q6H PRN, 500 mg at 12/06/19 2106 **OR** methocarbamol (ROBAXIN) 500 mg in dextrose 5 % 50 mL IVPB, 500 mg, Intravenous, Q6H PRN, Erline Levine, MD .  ondansetron [facility]) tablet 4 mg, 4 mg, Oral, Q6H PRN **OR** ondansetron (ZOFRAN) injection 4 mg, 4 mg, Intravenous, Q6H PRN, Erline Levine, MD .  pantoprazole (PROTONIX) EC tablet 40 mg, 40 mg, Oral, Graciela Husbands, MD, 40 mg at 12/06/19 2105 .  senna-docusate (Senokot-S) tablet 1 tablet, 1 tablet, Oral, QHS PRN, Erline Levine, MD, 1 tablet at 12/06/19 2106 .  sodium chloride flush (NS) 0.9 % injection 3 mL, 3 mL, Intravenous, Q12H, Erline Levine, MD, 3 mL at 12/06/19 2107 .  sodium chloride flush (NS) 0.9 % injection 3 mL, 3 mL, Intravenous, PRN, Erline Levine, MD .  sodium phosphate (FLEET) 7-19 GM/118ML enema 1 enema, 1 enema, Rectal, Once PRN, Erline Levine, MD .  traMADol [facility]) tablet 50-100 mg, 50-100 mg, Oral, QID PRN, Erline Levine, MD .  zolpidem [facility]) tablet 5 mg, 5 mg, Oral, QHS PRN, Erline Levine, MD  Patients Current Diet:  Diet Order            Diet regular Room service appropriate? Yes; Fluid consistency: Thin  Diet effective now                 Precautions / Restrictions Precautions Precautions: Back Precaution Comments: cueing for back precautions. Restrictions Weight Bearing Restrictions: No Other Position/Activity Restrictions: no brace needed per order set   Has the patient had 2 or more falls or a fall with injury in the past year? No  Prior Activity Level Community (5-7x/wk): active until symptoms started, and ever since 2 months ago (when symptoms started, she has had limited community ambulation).   Prior Functional  Level Self Care: Did the patient need help bathing, dressing, using the toilet or eating? Independent  Indoor Mobility: Did the patient need assistance with walking from room to room (with or without device)? Independent  Stairs: Did the patient need assistance with internal or external stairs (with or without device)? Independent  Functional Cognition: Did the patient need help planning regular tasks such as shopping or remembering to take medications? Independent  Home Assistive Devices / Equipment Home Equipment: None  Prior Device Use: Indicate devices/aids used by the patient prior to current illness, exacerbation or injury? None of the above  Current Functional Level Cognition  Overall Cognitive Status: Within Functional Limits for tasks assessed Orientation Level: Oriented X4 General Comments: very pleasant and extremely motivated today, good carryover of safety and back precautions from  yesterday    Extremity Assessment (includes Sensation/Coordination)  Upper Extremity Assessment: Defer to OT evaluation  Lower Extremity Assessment: RLE deficits/detail, LLE deficits/detail RLE Deficits / Details: strength 3/5 hip flexors, quadriceps, ankle dorsiflexion; paresthesias, and feeling like LEs are heavy RLE Sensation: decreased light touch, decreased proprioception RLE Coordination: decreased gross motor, decreased fine motor LLE Deficits / Details: strength 3-/5 hip flexors, quadriceps, ankle dorsiflexion; paresthesias, legs feel heavy LLE Sensation: decreased light touch, decreased proprioception LLE Coordination: decreased gross motor    ADLs  Overall ADL's : Needs assistance/impaired Eating/Feeding: Independent Grooming: Wash/dry hands, Wash/dry face, Oral care, Brushing hair, Set up, Sitting Upper Body Bathing: Set up, Sitting Lower Body Bathing: Maximal assistance, Sit to/from stand Upper Body Dressing : Set up, Sitting Lower Body Dressing: Moderate assistance, Sit  to/from stand Toilet Transfer: Minimal assistance, RW Toileting- Clothing Manipulation and Hygiene: Maximal assistance, Sit to/from stand Toileting - Clothing Manipulation Details (indicate cue type and reason): Pt with impaired bowel and bladder function  Functional mobility during ADLs: Min guard, Rolling walker    Mobility  Overal bed mobility: Needs Assistance Bed Mobility: Rolling, Sidelying to Sit Rolling: Min assist Sidelying to sit: Mod assist Sit to sidelying: Mod assist General bed mobility comments: VC for technique, MinA to roll completely on to her side and then [place] to push trunk to upright from sidelying    Transfers  Overall transfer level: Needs assistance Equipment used: Rolling walker (2 wheeled) Transfers: Sit to/from Stand, Stand Pivot Transfers Sit to Stand: Min guard Stand pivot transfers: Min guard General transfer comment: min guard to boost to full upright position and gain balance, VC for appropriate technique given back precautions    Ambulation / Gait / Stairs / Wheelchair Mobility  Ambulation/Gait Ambulation/Gait assistance: Counsellor (Feet): 160 Feet Assistive device: Rolling walker (2 wheeled) Gait Pattern/deviations: Step-through pattern, Decreased step length - right, Decreased step length - left, Decreased stride length, Narrow base of support, Antalgic, Decreased dorsiflexion - right, Decreased dorsiflexion - left General Gait Details: more confident with mobility today, no ataxia noted but still discoordinated steps with poor dorsiflexion/heel strike and still wtih some tendency for R inversion in stance phase. Cues to relax shoulders and for upright posture, back precautions especially with turning. Mild B knee bucklng L>R but able to control with BUE support. Needed one standing rest break. Gait velocity: quite slow    Posture / Balance Dynamic Sitting Balance Sitting balance - Comments: min guard for safety, BUE  support Balance Overall balance assessment: Needs assistance Sitting-balance support: Feet supported, Bilateral upper extremity supported Sitting balance-Leahy Scale: Good Sitting balance - Comments: min guard for safety, BUE support Standing balance support: Bilateral upper extremity supported Standing balance-Leahy Scale: Poor Standing balance comment: reliant on UE support     Special needs/care consideration Continuous Drip IV  dextrose 5% and 0.45% NaCl with KCl 20 mEq/L infusion   Skin: surgical incision to back   Previous Home Environment (from acute therapy documentation) Living Arrangements: Spouse/significant other, Children (1 daughter 9yo, one daughter 61mo) Available Help at Discharge: Family, Available PRN/intermittently Type of Home: Apartment Home Layout: One level Home Access: Stairs to enter Entrance Stairs-Rails: None Entrance Stairs-Number of Steps: 1 Bathroom Shower/Tub: Tub/shower unit Additional Comments: lives with spouse, who works during the day, 618 mosold and 968y.o. daughters   Discharge Living Setting Plans for Discharge Living Setting: Apartment (lives with husband and children) Type of Home at Discharge: Apartment Discharge Home Layout: One level Discharge  Home Access: Stairs to enter Entrance Stairs-Rails: None Entrance Stairs-Number of Steps: 1 Discharge Bathroom Shower/Tub: Tub/shower unit Discharge Bathroom Toilet: Standard Discharge Bathroom Accessibility: Yes How Accessible: Accessible via walker Does the patient have any problems obtaining your medications?: No  Social/Family/Support Systems Patient Roles: Spouse, Parent Contact Information: spouse Lou Miner): [phone] Anticipated Caregiver: spouse Anticipated Caregiver's Contact Information: see above Ability/Limitations of Caregiver: Min A Caregiver Availability: 24/7 (if he can get FMLA paperwork ) Discharge Plan Discussed with Primary Caregiver: Yes (pt and spouse) Is Caregiver  In Agreement with Plan?: Yes Does Caregiver/Family have Issues with Lodging/Transportation while Pt is in Rehab?: No  Goals Patient/Family Goal for Rehab: PT/OT: Mod I/supervision; SLP: NA Expected length of stay: 4-6 days Pt/Family Agrees to Admission and willing to participate: Yes Program Orientation Provided & Reviewed with Pt/Caregiver Including Roles  & Responsibilities: Yes (pt and spouse)  Barriers to Discharge: Incontinence, Lack of/limited family support  Barriers to Discharge Comments: husband will need MD note for FMLA   Decrease burden of Care through IP rehab admission: NA  Possible need for SNF placement upon discharge: Not anticipated; anticipate pt can DC home at [facility] I/Supervision level after CIR stay. Pt's husband can provide 24/7 A if he gets FMLA.   Patient Condition: I have reviewed medical records from [facility], spoken with NP, RN, and patient and spouse. I met with patient at the bedside for inpatient rehabilitation assessment.  Patient will benefit from ongoing PT and OT, can actively participate in 3 hours of therapy a day 5 days of the week, and can make measurable gains during the admission.  Patient will also benefit from the coordinated team approach during an Inpatient Acute Rehabilitation admission.  The patient will receive intensive therapy as well as Rehabilitation physician, nursing, social worker, and care management interventions.  Due to bladder management, bowel management, safety, skin/wound care, disease management, medication administration, pain management and patient education the patient requires 24 hour a day rehabilitation nursing.  The patient is currently Min A for transfers and Min G with mobility and Min A to Total A for basic ADLs.  Discharge setting and therapy post discharge at home with home health is anticipated.  Patient has agreed to participate in the Acute Inpatient Rehabilitation Program and will admit  12/07/19.  Preadmission Screen Completed By:  Raechel Ache, 12/07/2019 11:46 AM ______________________________________________________________________   Discussed status with Dr. Posey Pronto on 12/07/19 at 11:45AM and received approval for admission today.  Admission Coordinator:  Raechel Ache, OT, time 11:45AM/Date 12/07/19.   Assessment/Plan: Diagnosis: cauda equina syndrome s/p lumbar laminectomy  1. Does the need for close, 24 hr/day Medical supervision in concert with the patient's rehab needs make it unreasonable for this patient to be served in a less intensive setting? Potentially 2. Co-Morbidities requiring supervision/potential complications: TB, CF, leukocytosis (repeat labs, cont to monitor for signs and symptoms of infection, further workup if indicated), hyperglycemia (likely steroid induced, Monitor in accordance with exercise and adjust meds as necessary) 3. Due to bladder management, bowel management, safety, skin/wound care, disease management, pain management and patient education, does the patient require 24 hr/day rehab nursing? Potentially 4. Does the patient require coordinated care of a physician, rehab nurse, PT, OT to address physical and functional deficits in the context of the above medical diagnosis(es)? Potentially Addressing deficits in the following areas: balance, endurance, locomotion, strength, transferring, bathing, dressing, toileting and psychosocial support 5. Can the patient actively participate in an intensive therapy program of at least  3 hrs of therapy 5 days a week? Yes 6. The potential for patient to make measurable gains while on inpatient rehab is excellent 7. Anticipated functional outcomes upon discharge from inpatient rehab: modified independent PT, modified independent OT, n/a SLP 8. Estimated rehab length of stay to reach the above functional goals is: 4-6 days. 9. Anticipated discharge destination: Home 10. Overall Rehab/Functional Prognosis:  excellent and good   MD Signature: Delice Lesch, MD, ABPMR

## 2019-12-06 NOTE — Progress Notes (Signed)
Inpatient Rehabilitation-Admissions Coordinator   Received insurance approval for admit to CIR. However, I do not have an open bed for this patient today. Will follow up tomorrow for possible admit, pending bed availability.   Cheri Rous, OTR/L  Rehab Admissions Coordinator  [phone] 12/06/2019 3:00 PM

## 2019-12-06 NOTE — Progress Notes (Signed)
Physical Therapy Treatment Patient Details Name: Melanie Baldwin MRN: [id number] DOB: 06-19-1985 Today's Date: 12/06/2019    History of Present Illness This 34 y.o. admitted with cauda equina syndrome and underwent L4-5 microdiscectomy.  PMH non contributory     PT Comments    Patient received in bed, extremely motivated and with great carryover/safety awareness from last session. Still needs Min-ModA for safe functional bed mobility but has good technique, does benefit from VC however; able to maintain midline with close min guard and heavy BUE support, hesitant to lift her hands from the bed due to weakness and impaired sitting balance. Showed great improvement in functional transfers and gait today- still heavily reliant on BUE on RW, and with gross weakness resulting in B knee buckling with gait (able to control with BUE support and VC for quad activation) as well as heavily impaired proprioception likely due to BLE numbness, but much less ataxic today. Left positioned to comfort in recliner with all needs met, spouse present and very supportive, RN aware of patient status. Progressing very well, however still very much in need of intensive therapy services in the CIR setting.     Follow Up Recommendations  CIR     Equipment Recommendations  Rolling walker with 5" wheels;3in1 (PT)    Recommendations for Other Services       Precautions / Restrictions Precautions Precautions: Back Precaution Comments: Pt verbally instructed in back precautions  Restrictions Weight Bearing Restrictions: No Other Position/Activity Restrictions: no brace needed per order set    Mobility  Bed Mobility Overal bed mobility: Needs Assistance Bed Mobility: Rolling;Sidelying to Sit Rolling: Min assist Sidelying to sit: Mod assist       General bed mobility comments: VC for technique, MinA to roll completely on to her side and then [place] to push trunk to upright from sidelying  Transfers Overall  transfer level: Needs assistance Equipment used: Rolling walker (2 wheeled) Transfers: Sit to/from Stand Sit to Stand: Min assist         General transfer comment: min guard to boost to full upright position and gain balance, VC for appropriate technique given back precautions  Ambulation/Gait Ambulation/Gait assistance: Min guard Gait Distance (Feet): 160 Feet Assistive device: Rolling walker (2 wheeled) Gait Pattern/deviations: Step-through pattern;Decreased step length - right;Decreased step length - left;Decreased stride length;Narrow base of support;Antalgic;Decreased dorsiflexion - right;Decreased dorsiflexion - left Gait velocity: quite slow   General Gait Details: more confident with mobility today, no ataxia noted but still discoordinated steps with poor dorsiflexion/heel strike and still wtih some tendency for R inversion in stance phase. Cues to relax shoulders and for upright posture, back precautions especially with turning. Mild B knee bucklng L>R but able to control with BUE support. Needed one standing rest break.   Stairs             Wheelchair Mobility    Modified Rankin (Stroke Patients Only)       Balance Overall balance assessment: Needs assistance Sitting-balance support: Feet supported;Bilateral upper extremity supported Sitting balance-Leahy Scale: Fair Sitting balance - Comments: min guard for safety, BUE support   Standing balance support: Bilateral upper extremity supported Standing balance-Leahy Scale: Poor Standing balance comment: reliant on UE support                             Cognition Arousal/Alertness: Awake/alert Behavior During Therapy: WFL for tasks assessed/performed Overall Cognitive Status: Within Functional Limits for tasks assessed  General Comments: very pleasant and extremely motivated today, good carryover of safety and back precautions from yesterday       Exercises      General Comments General comments (skin integrity, edema, etc.): extremely motivated with great carryover from previous session      Pertinent Vitals/Pain Pain Assessment: Faces Faces Pain Scale: Hurts a little bit Pain Location: Rt LE and back  Pain Descriptors / Indicators: Operative site guarding;Guarding Pain Intervention(s): Limited activity within patient's tolerance;Monitored during session;Premedicated before session;Repositioned    Home Living                      Prior Function            PT Goals (current goals can now be found in the care plan section) Acute Rehab PT Goals Patient Stated Goal: to be able to take care of daughter  PT Goal Formulation: With patient Time For Goal Achievement: 12/19/19 Potential to Achieve Goals: Good Progress towards PT goals: Progressing toward goals    Frequency    Min 5X/week      PT Plan Current plan remains appropriate    Co-evaluation              AM-PAC PT "6 Clicks" Mobility   Outcome Measure  Help needed turning from your back to your side while in a flat bed without using bedrails?: A Little Help needed moving from lying on your back to sitting on the side of a flat bed without using bedrails?: A Lot Help needed moving to and from a bed to a chair (including a wheelchair)?: A Little Help needed standing up from a chair using your arms (e.g., wheelchair or bedside chair)?: A Little Help needed to walk in hospital room?: A Little Help needed climbing 3-5 steps with a railing? : A Lot 6 Click Score: 16    End of Session Equipment Utilized During Treatment: Gait belt Activity Tolerance: Patient tolerated treatment well Patient left: in chair;with call bell/phone within reach;with family/visitor present Nurse Communication: Mobility status PT Visit Diagnosis: Unsteadiness on feet (R26.81);Other abnormalities of gait and mobility (R26.89);Muscle weakness (generalized) (M62.81);Ataxic  gait (R26.0)     Time: 7517-0017 PT Time Calculation (min) (ACUTE ONLY): 25 min  Charges:  $Gait Training: 8-22 mins $Therapeutic Activity: 8-22 mins                     Windell Norfolk, DPT, PN1   Supplemental Physical Therapist [place]    Pager [phone] Acute Rehab Office [phone]

## 2019-12-07 ENCOUNTER — Encounter (HOSPITAL_COMMUNITY): Payer: Self-pay | Admitting: Physical Medicine and Rehabilitation

## 2019-12-07 ENCOUNTER — Inpatient Hospital Stay (HOSPITAL_COMMUNITY)
Admission: RE | Admit: 2019-12-07 | Discharge: 2019-12-24 | DRG: 074 | Disposition: A | Discharge status: Home Health | Payer: Medicaid Other | Source: Intra-hospital | Attending: Physical Medicine and Rehabilitation | Admitting: Physical Medicine and Rehabilitation

## 2019-12-07 ENCOUNTER — Other Ambulatory Visit: Payer: Self-pay

## 2019-12-07 ENCOUNTER — Inpatient Hospital Stay (HOSPITAL_COMMUNITY): Payer: Medicaid Other

## 2019-12-07 DIAGNOSIS — Z1612 Extended spectrum beta lactamase (ESBL) resistance: Secondary | ICD-10-CM | POA: Diagnosis present

## 2019-12-07 DIAGNOSIS — R739 Hyperglycemia, unspecified: Secondary | ICD-10-CM

## 2019-12-07 DIAGNOSIS — T380X5A Adverse effect of glucocorticoids and synthetic analogues, initial encounter: Secondary | ICD-10-CM | POA: Diagnosis present

## 2019-12-07 DIAGNOSIS — M792 Neuralgia and neuritis, unspecified: Secondary | ICD-10-CM

## 2019-12-07 DIAGNOSIS — S343XXD Injury of cauda equina, subsequent encounter: Secondary | ICD-10-CM

## 2019-12-07 DIAGNOSIS — B373 Candidiasis of vulva and vagina: Secondary | ICD-10-CM | POA: Diagnosis not present

## 2019-12-07 DIAGNOSIS — B9620 Unspecified Escherichia coli [E. coli] as the cause of diseases classified elsewhere: Secondary | ICD-10-CM | POA: Diagnosis not present

## 2019-12-07 DIAGNOSIS — G8918 Other acute postprocedural pain: Secondary | ICD-10-CM

## 2019-12-07 DIAGNOSIS — K592 Neurogenic bowel, not elsewhere classified: Secondary | ICD-10-CM

## 2019-12-07 DIAGNOSIS — N390 Urinary tract infection, site not specified: Secondary | ICD-10-CM | POA: Diagnosis not present

## 2019-12-07 DIAGNOSIS — K649 Unspecified hemorrhoids: Secondary | ICD-10-CM

## 2019-12-07 DIAGNOSIS — R202 Paresthesia of skin: Secondary | ICD-10-CM | POA: Diagnosis present

## 2019-12-07 DIAGNOSIS — G8220 Paraplegia, unspecified: Secondary | ICD-10-CM | POA: Diagnosis present

## 2019-12-07 DIAGNOSIS — S343XXS Injury of cauda equina, sequela: Secondary | ICD-10-CM | POA: Diagnosis not present

## 2019-12-07 DIAGNOSIS — Z791 Long term (current) use of non-steroidal anti-inflammatories (NSAID): Secondary | ICD-10-CM | POA: Diagnosis not present

## 2019-12-07 DIAGNOSIS — Z141 Cystic fibrosis carrier: Secondary | ICD-10-CM | POA: Diagnosis not present

## 2019-12-07 DIAGNOSIS — Z833 Family history of diabetes mellitus: Secondary | ICD-10-CM

## 2019-12-07 DIAGNOSIS — N319 Neuromuscular dysfunction of bladder, unspecified: Secondary | ICD-10-CM

## 2019-12-07 DIAGNOSIS — S343XXA Injury of cauda equina, initial encounter: Secondary | ICD-10-CM | POA: Diagnosis present

## 2019-12-07 DIAGNOSIS — Z8611 Personal history of tuberculosis: Secondary | ICD-10-CM

## 2019-12-07 DIAGNOSIS — G834 Cauda equina syndrome: Principal | ICD-10-CM

## 2019-12-07 DIAGNOSIS — R109 Unspecified abdominal pain: Secondary | ICD-10-CM

## 2019-12-07 DIAGNOSIS — K5901 Slow transit constipation: Secondary | ICD-10-CM

## 2019-12-07 DIAGNOSIS — D62 Acute posthemorrhagic anemia: Secondary | ICD-10-CM

## 2019-12-07 DIAGNOSIS — K5900 Constipation, unspecified: Secondary | ICD-10-CM

## 2019-12-07 MED ORDER — LIDOCAINE HCL URETHRAL/MUCOSAL 2 % EX GEL: CUTANEOUS | Status: DC | PRN

## 2019-12-07 MED ORDER — HYDROCODONE-ACETAMINOPHEN 5-325 MG PO TABS: 1.0000 | ORAL_TABLET | ORAL | Status: DC | PRN

## 2019-12-07 MED ORDER — DIPHENHYDRAMINE HCL 12.5 MG/5ML PO ELIX
12.5000 mg | ORAL_SOLUTION | Freq: Four times a day (QID) | ORAL | Status: DC | PRN
  Administered 2019-12-08: 25 mg via ORAL
  Filled 2019-12-07: qty 10

## 2019-12-07 MED ORDER — POLYETHYLENE GLYCOL 3350 17 G PO PACK
17.0000 g | PACK | Freq: Once | ORAL | Status: AC
  Administered 2019-12-07: 17 g via ORAL
  Filled 2019-12-07: qty 1

## 2019-12-07 MED ORDER — HYDROCODONE-ACETAMINOPHEN 5-325 MG PO TABS
1.0000 | ORAL_TABLET | ORAL | Status: DC | PRN
  Administered 2019-12-09 – 2019-12-16 (×3): 2 via ORAL
  Filled 2019-12-07 (×4): qty 2

## 2019-12-07 MED ORDER — FLEET ENEMA 7-19 GM/118ML RE ENEM
1.0000 | ENEMA | Freq: Once | RECTAL | Status: AC
  Administered 2019-12-07: 1 via RECTAL
  Filled 2019-12-07: qty 1

## 2019-12-07 MED ORDER — PROCHLORPERAZINE EDISYLATE 10 MG/2ML IJ SOLN: 5.0000 mg | Freq: Four times a day (QID) | INTRAMUSCULAR | Status: DC | PRN

## 2019-12-07 MED ORDER — METHOCARBAMOL 500 MG PO TABS: 500.0000 mg | ORAL_TABLET | Freq: Four times a day (QID) | ORAL | Status: DC | PRN

## 2019-12-07 MED ORDER — SENNOSIDES-DOCUSATE SODIUM 8.6-50 MG PO TABS
2.0000 | ORAL_TABLET | Freq: Every day | ORAL | Status: DC
  Administered 2019-12-07 – 2019-12-09 (×3): 2 via ORAL
  Filled 2019-12-07 (×3): qty 2

## 2019-12-07 MED ORDER — MENTHOL 3 MG MT LOZG: 1.0000 | LOZENGE | OROMUCOSAL | Status: DC | PRN

## 2019-12-07 MED ORDER — FLEET ENEMA 7-19 GM/118ML RE ENEM
1.0000 | ENEMA | Freq: Once | RECTAL | Status: AC | PRN
  Administered 2019-12-20: 1 via RECTAL
  Filled 2019-12-07: qty 1

## 2019-12-07 MED ORDER — POLYETHYLENE GLYCOL 3350 17 G PO PACK
17.0000 g | PACK | Freq: Every day | ORAL | Status: DC | PRN
  Administered 2019-12-09: 17 g via ORAL
  Filled 2019-12-07: qty 1

## 2019-12-07 MED ORDER — METHOCARBAMOL 1000 MG/10ML IJ SOLN
500.0000 mg | Freq: Four times a day (QID) | INTRAVENOUS | Status: DC | PRN
  Filled 2019-12-07: qty 5

## 2019-12-07 MED ORDER — GUAIFENESIN-DM 100-10 MG/5ML PO SYRP: 5.0000 mL | ORAL_SOLUTION | Freq: Four times a day (QID) | ORAL | Status: DC | PRN

## 2019-12-07 MED ORDER — PHENOL 1.4 % MT LIQD: 1.0000 | OROMUCOSAL | Status: DC | PRN

## 2019-12-07 MED ORDER — PROCHLORPERAZINE 25 MG RE SUPP: 12.5000 mg | Freq: Four times a day (QID) | RECTAL | Status: DC | PRN

## 2019-12-07 MED ORDER — PANTOPRAZOLE SODIUM 40 MG PO TBEC
40.0000 mg | DELAYED_RELEASE_TABLET | Freq: Every day | ORAL | Status: DC
  Administered 2019-12-08 – 2019-12-23 (×17): 40 mg via ORAL
  Filled 2019-12-07 (×17): qty 1

## 2019-12-07 MED ORDER — ACETAMINOPHEN 325 MG PO TABS
325.0000 mg | ORAL_TABLET | ORAL | Status: DC | PRN
  Administered 2019-12-08 – 2019-12-18 (×3): 650 mg via ORAL
  Filled 2019-12-07 (×3): qty 2

## 2019-12-07 MED ORDER — BISACODYL 10 MG RE SUPP: 10.0000 mg | Freq: Every day | RECTAL | Status: DC | PRN

## 2019-12-07 MED ORDER — ALUM & MAG HYDROXIDE-SIMETH 200-200-20 MG/5ML PO SUSP
30.0000 mL | ORAL | Status: DC | PRN
  Administered 2019-12-08 – 2019-12-10 (×3): 30 mL via ORAL
  Filled 2019-12-07 (×3): qty 30

## 2019-12-07 MED ORDER — TRAMADOL HCL 50 MG PO TABS: 50.0000 mg | ORAL_TABLET | Freq: Four times a day (QID) | ORAL | Status: DC | PRN

## 2019-12-07 MED ORDER — PROCHLORPERAZINE MALEATE 5 MG PO TABS
5.0000 mg | ORAL_TABLET | Freq: Four times a day (QID) | ORAL | Status: DC | PRN
  Administered 2019-12-10 – 2019-12-15 (×2): 10 mg via ORAL
  Filled 2019-12-07 (×3): qty 2

## 2019-12-07 MED ORDER — MELOXICAM 7.5 MG PO TABS
15.0000 mg | ORAL_TABLET | Freq: Every day | ORAL | Status: DC
  Administered 2019-12-08 – 2019-12-24 (×17): 15 mg via ORAL
  Filled 2019-12-07 (×17): qty 2

## 2019-12-07 MED ORDER — TRAZODONE HCL 50 MG PO TABS: 25.0000 mg | ORAL_TABLET | Freq: Every evening | ORAL | Status: DC | PRN

## 2019-12-07 MED ORDER — BISACODYL 10 MG RE SUPP
10.0000 mg | Freq: Every day | RECTAL | Status: DC
  Administered 2019-12-08 – 2019-12-23 (×14): 10 mg via RECTAL
  Filled 2019-12-07 (×16): qty 1

## 2019-12-07 NOTE — Plan of Care (Signed)
Adequately ready for discharge to Rehab. Patient aware of rehab and ready to paticipate.

## 2019-12-07 NOTE — Discharge Summary (Signed)
Physician Discharge Summary  Patient ID: Melanie Baldwin MRN: [id number] DOB/AGE: Jan 20, 1986 34 y.o.  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 The patient tolerated the procedure well and was taken to the recovery room and then to the floor in stable condition. The hospital course was routine. Her preoperative symptoms persisted after her surgery, but did have a slight improvement in her BLE strength. She continued to have urinary retention and required reinsertion of a Foley catheter. The wound remained clean dry and intact. Pt had appropriate back soreness. No new complaints of leg pain or new N/T/W. The patient remained afebrile with stable vital signs, and tolerated a regular diet. The patient continued to work with therapies, and pain was well controlled with oral pain medications.   Consults: None  Significant Diagnostic Studies: radiology: X-ray  Treatments: surgery: Bilateral Lumbar Four-Five Microdiscectomy (Bilateral)  With resection of ruptured lumbar disc   Discharge Exam: Blood pressure 103/68, pulse 84, temperature 98.7 F (37.1 C), temperature source Oral, resp. rate 18, height 5\' 4"  (1.626 m), weight 77.1 kg, last menstrual period 11/10/2019, SpO2 99 %, unknown if currently breastfeeding.  Physical Exam: Patient is A/O X 4 and conversant. Pt reports that she feels as though her strength is about the same as yesterday and continues to be unable to  ambulate without assistance. Foley catheter present for urinary retention. Saddle anesthesia persists. Dressing is CDI.  Disposition: Discharge disposition: 70-Another Health Care Institution Not Defined        Allergies as of 12/07/2019   No Known Allergies     Medication List    STOP taking these medications   ibuprofen 600 MG tablet Commonly known as: ADVIL   meloxicam 15 MG tablet Commonly known as: MOBIC   predniSONE 20 MG tablet Commonly known as: DELTASONE     TAKE these medications   methocarbamol 500 MG tablet Commonly known as: ROBAXIN Take 1 tablet (500 mg total) by mouth every 12 (twelve) hours as needed for muscle spasms.   traMADol 50 MG tablet Commonly known as: ULTRAM Take 50-100 mg by mouth 4 (four) times daily as needed for moderate pain.        Signed: 13/12/2019, DNP, NP-C 12/07/2019, 11:40 AM

## 2019-12-07 NOTE — Progress Notes (Signed)
Patient admitted to 4w01. A&Ox4 no complaints of pain. Unit policies reviewed with patient

## 2019-12-07 NOTE — Progress Notes (Signed)
Physical Therapy Treatment Patient Details Name: Melanie Baldwin MRN: [id number] DOB: 11-04-1985 Today's Date: 12/07/2019    History of Present Illness This 34 y.o. admitted with cauda equina syndrome and underwent L4-5 microdiscectomy.  PMH non contributory     PT Comments    Pt supine in bed on arrival this session.  Pt required cues for sequencing and to avoid twisting with rolling to edge of bed.  Pt continues to benefit from aggressive rehab in a post acute setting to maximize functional gains before returning home.  Pt continues to require min to mod assistance.      Follow Up Recommendations  CIR     Equipment Recommendations  Rolling walker with 5" wheels;3in1 (PT)    Recommendations for Other Services Rehab consult     Precautions / Restrictions Precautions Precautions: Back Precaution Comments: cueing for back precautions. Restrictions Weight Bearing Restrictions: No Other Position/Activity Restrictions: no brace needed per order set    Mobility  Bed Mobility Overal bed mobility: Needs Assistance Bed Mobility: Rolling;Sidelying to Sit Rolling: Min assist Sidelying to sit: Mod assist       General bed mobility comments: VC for technique, MinA to roll completely on to her side and then ModA to push trunk to upright from sidelying  Transfers Overall transfer level: Needs assistance Equipment used: Rolling walker (2 wheeled) Transfers: Sit to/from Stand Sit to Stand: Min assist         General transfer comment: Min assistance to boost into standing.  Cues for hand placement.  Ambulation/Gait Ambulation/Gait assistance: Min assist Gait Distance (Feet): 150 Feet Assistive device: Rolling walker (2 wheeled) Gait Pattern/deviations: Step-through pattern;Decreased step length - right;Decreased step length - left;Decreased stride length;Narrow base of support;Antalgic;Decreased dorsiflexion - right;Decreased dorsiflexion - left Gait velocity: quite slow    General Gait Details: Pt required cues for quad activation and B heel strike.  No buckling noted this session.  Heavy reliance of UEs to mobilize.   Stairs             Wheelchair Mobility    Modified Rankin (Stroke Patients Only)       Balance Overall balance assessment: Needs assistance Sitting-balance support: Feet supported;Bilateral upper extremity supported Sitting balance-Leahy Scale: Good       Standing balance-Leahy Scale: Poor Standing balance comment: reliant on UE support and external assistance.                            Cognition Arousal/Alertness: Awake/alert Behavior During Therapy: WFL for tasks assessed/performed Overall Cognitive Status: Within Functional Limits for tasks assessed                                 General Comments: very pleasant and extremely motivated today, good carryover of safety and back precautions from yesterday      Exercises      General Comments        Pertinent Vitals/Pain Pain Assessment: Faces Faces Pain Scale: Hurts a little bit Pain Location: Rt LE and back  Pain Descriptors / Indicators: Operative site guarding;Guarding;Numbness Pain Intervention(s): Monitored during session;Repositioned    Home Living                      Prior Function            PT Goals (current goals can now be found in the care plan  section) Acute Rehab PT Goals Patient Stated Goal: to be able to take care of daughter  Potential to Achieve Goals: Good Progress towards PT goals: Progressing toward goals    Frequency    Min 5X/week      PT Plan Current plan remains appropriate    Co-evaluation              AM-PAC PT "6 Clicks" Mobility   Outcome Measure  Help needed turning from your back to your side while in a flat bed without using bedrails?: A Little Help needed moving from lying on your back to sitting on the side of a flat bed without using bedrails?: A Lot Help needed  moving to and from a bed to a chair (including a wheelchair)?: A Little Help needed standing up from a chair using your arms (e.g., wheelchair or bedside chair)?: A Little Help needed to walk in hospital room?: A Little Help needed climbing 3-5 steps with a railing? : A Lot 6 Click Score: 16    End of Session Equipment Utilized During Treatment: Gait belt Activity Tolerance: Patient tolerated treatment well Patient left: in chair;with call bell/phone within reach;with family/visitor present Nurse Communication: Mobility status PT Visit Diagnosis: Unsteadiness on feet (R26.81);Other abnormalities of gait and mobility (R26.89);Muscle weakness (generalized) (M62.81);Ataxic gait (R26.0)     Time: 9767-3419 PT Time Calculation (min) (ACUTE ONLY): 26 min  Charges:  $Gait Training: 8-22 mins $Therapeutic Activity: 8-22 mins                     Bonney Leitz , PTA Acute Rehabilitation Services Pager [phone] Office [phone]     Melanie Baldwin 12/07/2019, 1:03 PM

## 2019-12-07 NOTE — Progress Notes (Signed)
Jamse Arn, MD  Physician  Physical Medicine and Rehabilitation  PMR Pre-admission      Signed  Date of Service:  12/06/2019  4:13 PM      Related encounter: ED to Hosp-Admission (Current) from 12/04/2019 in [place]      Signed       Show:Clear all '[x]' Manual'[x]' Template'[]' Copied  Added by: '[x]' Raechel Ache, OT'[x]' Jamse Arn, MD  '[]' Hover for details PMR Admission Coordinator Pre-Admission Assessment   Patient: Melanie Baldwin is an 34 y.o., female MRN: [id number] DOB: 07-13-1985 Height: '5\' 4"'  (162.6 cm) Weight: 77.1 kg   Insurance Information HMO:    PPO: yes     PCP:      IPA:      80/20:      OTHER:  PRIMARY: [place]       Policy#: [id number]      Subscriber: patient CM Name: Azel J.      Phone#: [phone]     Fax#: [phone] Pre-Cert#: [id number]      Employer:  Josem Kaufmann provided by Maryjane Hurter. For admit to CIR. Pt is approved for 8 days from 12/07/19-12/14/19 and next review date is 12/17/19; clinicals to be sent to (f): [phone] Benefits:  Phone #: [phone]     Name:  Eff. Date: 07/26/2019 - still active     Deduct: $0 -does not have      Out of Pocket Max: $0 - does not have      Life Max: NA CIR: $0 co-pay      SNF: $0 co-pay; limited by medical necessity review Outpatient: $3 co-pay     Home Health: $0 co-pay; limited by medical necessity review   DME: $0 co-pay; limited by medical necessity reivew  Providers:  SECONDARY: None      Policy#:      Phone#:    Financial Counselor:       Phone#:    The Therapist, art Information Summary" for patients in Inpatient Rehabilitation Facilities with attached "Privacy Act [place] Records" was provided and verbally reviewed with: NA   Emergency Contact Information         Contact Information     Name Relation Home Work Mobile    Dahal,Kharka Spouse [phone]             Current Medical History  Patient Admitting Diagnosis: cauda  equina syndrome    History of Present Illness: Pt is a 34 yo Female who presented with worsening back pain, BLE radiculopathy, saddle anesthesia and urinary retention. MRI showed a large midline herniated disc at L4/5 level with severe spinal stenosis and compression of the thecal sac. Pt was admitted to [facility] on 12/04/19 and underwent emergent microdiscectomies at L4/L5 level and resection of the ruptured lumbar disc. Pt seen by therapies and recommended for CIR due to persistent saddle anesthesia, urinary retention, and functional ambulation deficits. Pt is to admit to CIR 12/07/19.    Patient's medical record from [facility] has been reviewed by the rehabilitation admission coordinator and physician.   Past Medical History      Past Medical History:  Diagnosis Date  . Cystic fibrosis carrier    . Normal pregnancy 09/24/2010  . TB (pulmonary tuberculosis)      took med for 6 months currently negative      Family History   family history includes Diabetes in her paternal grandmother.   Prior Rehab/Hospitalizations Has the patient had prior  rehab or hospitalizations prior to admission? Yes   Has the patient had major surgery during 100 days prior to admission? Yes              Current Medications   Current Facility-Administered Medications:  .  0.9 %  sodium chloride infusion, 250 mL, Intravenous, Continuous, Erline Levine, MD .  acetaminophen (TYLENOL) tablet 650 mg, 650 mg, Oral, Q4H PRN **OR** acetaminophen (TYLENOL) suppository 650 mg, 650 mg, Rectal, Q4H PRN, Erline Levine, MD .  alum & mag hydroxide-simeth (MAALOX/MYLANTA) 200-200-20 MG/5ML suspension 30 mL, 30 mL, Oral, Q6H PRN, Erline Levine, MD .  bisacodyl (DULCOLAX) suppository 10 mg, 10 mg, Rectal, Daily PRN, Erline Levine, MD .  Chlorhexidine Gluconate Cloth 2 % PADS 6 each, 6 each, Topical, Daily, Erline Levine, MD, 6 each at 12/07/19 1002 .  dextrose 5 % and 0.45 % NaCl with KCl 20 mEq/L infusion, ,  Intravenous, Continuous, Erline Levine, MD, Last Rate: 75 mL/hr at 12/05/19 0215, New Bag at 12/05/19 0215 .  docusate sodium (COLACE) capsule 100 mg, 100 mg, Oral, BID, Erline Levine, MD, 100 mg at 12/07/19 1002 .  HYDROcodone-acetaminophen (NORCO/VICODIN) 5-325 MG per tablet 1-2 tablet, 1-2 tablet, Oral, Q4H PRN, Erline Levine, MD .  HYDROmorphone (DILAUDID) injection 0.5 mg, 0.5 mg, Intravenous, Q2H PRN, Erline Levine, MD .  meloxicam [facility]) tablet 15 mg, 15 mg, Oral, Daily, Erline Levine, MD, 15 mg at 12/07/19 1000 .  menthol-cetylpyridinium (CEPACOL) lozenge 3 mg, 1 lozenge, Oral, PRN **OR** phenol (CHLORASEPTIC) mouth spray 1 spray, 1 spray, Mouth/Throat, PRN, Erline Levine, MD .  methocarbamol (ROBAXIN) tablet 500 mg, 500 mg, Oral, Q6H PRN, 500 mg at 12/06/19 2106 **OR** methocarbamol (ROBAXIN) 500 mg in dextrose 5 % 50 mL IVPB, 500 mg, Intravenous, Q6H PRN, Erline Levine, MD .  ondansetron [facility]) tablet 4 mg, 4 mg, Oral, Q6H PRN **OR** ondansetron (ZOFRAN) injection 4 mg, 4 mg, Intravenous, Q6H PRN, Erline Levine, MD .  pantoprazole (PROTONIX) EC tablet 40 mg, 40 mg, Oral, Graciela Husbands, MD, 40 mg at 12/06/19 2105 .  senna-docusate (Senokot-S) tablet 1 tablet, 1 tablet, Oral, QHS PRN, Erline Levine, MD, 1 tablet at 12/06/19 2106 .  sodium chloride flush (NS) 0.9 % injection 3 mL, 3 mL, Intravenous, Q12H, Erline Levine, MD, 3 mL at 12/06/19 2107 .  sodium chloride flush (NS) 0.9 % injection 3 mL, 3 mL, Intravenous, PRN, Erline Levine, MD .  sodium phosphate (FLEET) 7-19 GM/118ML enema 1 enema, 1 enema, Rectal, Once PRN, Erline Levine, MD .  traMADol [facility]at[facility]bon) tablet 50-100 mg, 50-100 mg, Oral, QID PRN, Erline Levine, MD .  zolpidem [facility]) tablet 5 mg, 5 mg, Oral, QHS PRN, Erline Levine, MD   Patients Current Diet:     Diet Order                      Diet regular Room service appropriate? Yes; Fluid consistency: Thin  Diet effective now                      Precautions /  Restrictions Precautions Precautions: Back Precaution Comments: cueing for back precautions. Restrictions Weight Bearing Restrictions: No Other Position/Activity Restrictions: no brace needed per order set    Has the patient had 2 or more falls or a fall with injury in the past year? No   Prior Activity Level Community (5-7x/wk): active until symptoms started, and ever since 2 months ago (when symptoms started, she has had  limited community ambulation).    Prior Functional Level Self Care: Did the patient need help bathing, dressing, using the toilet or eating? Independent   Indoor Mobility: Did the patient need assistance with walking from room to room (with or without device)? Independent   Stairs: Did the patient need assistance with internal or external stairs (with or without device)? Independent   Functional Cognition: Did the patient need help planning regular tasks such as shopping or remembering to take medications? Independent   Home Assistive Devices / Equipment Home Equipment: None   Prior Device Use: Indicate devices/aids used by the patient prior to current illness, exacerbation or injury? None of the above   Current Functional Level Cognition   Overall Cognitive Status: Within Functional Limits for tasks assessed Orientation Level: Oriented X4 General Comments: very pleasant and extremely motivated today, good carryover of safety and back precautions from yesterday    Extremity Assessment (includes Sensation/Coordination)   Upper Extremity Assessment: Defer to OT evaluation  Lower Extremity Assessment: RLE deficits/detail, LLE deficits/detail RLE Deficits / Details: strength 3/5 hip flexors, quadriceps, ankle dorsiflexion; paresthesias, and feeling like LEs are heavy RLE Sensation: decreased light touch, decreased proprioception RLE Coordination: decreased gross motor, decreased fine motor LLE Deficits / Details: strength 3-/5 hip flexors, quadriceps, ankle  dorsiflexion; paresthesias, legs feel heavy LLE Sensation: decreased light touch, decreased proprioception LLE Coordination: decreased gross motor     ADLs   Overall ADL's : Needs assistance/impaired Eating/Feeding: Independent Grooming: Wash/dry hands, Wash/dry face, Oral care, Brushing hair, Set up, Sitting Upper Body Bathing: Set up, Sitting Lower Body Bathing: Maximal assistance, Sit to/from stand Upper Body Dressing : Set up, Sitting Lower Body Dressing: Moderate assistance, Sit to/from stand Toilet Transfer: Minimal assistance, RW Toileting- Clothing Manipulation and Hygiene: Maximal assistance, Sit to/from stand Toileting - Clothing Manipulation Details (indicate cue type and reason): Pt with impaired bowel and bladder function  Functional mobility during ADLs: Min guard, Rolling walker     Mobility   Overal bed mobility: Needs Assistance Bed Mobility: Rolling, Sidelying to Sit Rolling: Min assist Sidelying to sit: Mod assist Sit to sidelying: Mod assist General bed mobility comments: VC for technique, MinA to roll completely on to her side and then [place] to push trunk to upright from sidelying     Transfers   Overall transfer level: Needs assistance Equipment used: Rolling walker (2 wheeled) Transfers: Sit to/from Stand, Stand Pivot Transfers Sit to Stand: Min guard Stand pivot transfers: Min guard General transfer comment: min guard to boost to full upright position and gain balance, VC for appropriate technique given back precautions     Ambulation / Gait / Stairs / Wheelchair Mobility   Ambulation/Gait Ambulation/Gait assistance: Counsellor (Feet): 160 Feet Assistive device: Rolling walker (2 wheeled) Gait Pattern/deviations: Step-through pattern, Decreased step length - right, Decreased step length - left, Decreased stride length, Narrow base of support, Antalgic, Decreased dorsiflexion - right, Decreased dorsiflexion - left General Gait Details: more  confident with mobility today, no ataxia noted but still discoordinated steps with poor dorsiflexion/heel strike and still wtih some tendency for R inversion in stance phase. Cues to relax shoulders and for upright posture, back precautions especially with turning. Mild B knee bucklng L>R but able to control with BUE support. Needed one standing rest break. Gait velocity: quite slow     Posture / Balance Dynamic Sitting Balance Sitting balance - Comments: min guard for safety, BUE support Balance Overall balance assessment: Needs assistance Sitting-balance support: Feet  supported, Bilateral upper extremity supported Sitting balance-Leahy Scale: Good Sitting balance - Comments: min guard for safety, BUE support Standing balance support: Bilateral upper extremity supported Standing balance-Leahy Scale: Poor Standing balance comment: reliant on UE support      Special needs/care consideration Continuous Drip IV  dextrose 5% and 0.45% NaCl with KCl 20 mEq/L infusion    Skin: surgical incision to back    Previous Home Environment (from acute therapy documentation) Living Arrangements: Spouse/significant other, Children (1 daughter 9yo, one daughter 19mo) Available Help at Discharge: Family, Available PRN/intermittently Type of Home: Apartment Home Layout: One level Home Access: Stairs to enter Entrance Stairs-Rails: None Entrance Stairs-Number of Steps: 1 Bathroom Shower/Tub: Tub/shower unit Additional Comments: lives with spouse, who works during the day, 658 mosold and 941y.o. daughters    Discharge Living Setting Plans for Discharge Living Setting: Apartment (lives with husband and children) Type of Home at Discharge: Apartment Discharge Home Layout: One level Discharge Home Access: Stairs to enter Entrance Stairs-Rails: None Entrance Stairs-Number of Steps: 1 Discharge Bathroom Shower/Tub: Tub/shower unit Discharge Bathroom Toilet: Standard Discharge Bathroom Accessibility:  Yes How Accessible: Accessible via walker Does the patient have any problems obtaining your medications?: No   Social/Family/Support Systems Patient Roles: Spouse, Parent Contact Information: spouse (Lou Miner: [phone] Caregiver: spouse Anticipated Caregiver's Contact Information: see above Ability/Limitations of Caregiver: Min A Caregiver Availability: 24/7 (if he can get FMLA paperwork ) Discharge Plan Discussed with Primary Caregiver: Yes (pt and spouse) Is Caregiver In Agreement with Plan?: Yes Does Caregiver/Family have Issues with Lodging/Transportation while Pt is in Rehab?: No   Goals Patient/Family Goal for Rehab: PT/OT: Mod I/supervision; SLP: NA Expected length of stay: 4-6 days Pt/Family Agrees to Admission and willing to participate: Yes Program Orientation Provided & Reviewed with Pt/Caregiver Including Roles  & Responsibilities: Yes (pt and spouse)  Barriers to Discharge: Incontinence, Lack of/limited family support  Barriers to Discharge Comments: husband will need MD note for FMLA    Decrease burden of Care through IP rehab admission: NA   Possible need for SNF placement upon discharge: Not anticipated; [facility] stay. Pt's husband can provide 24/7 A if he gets FMLA.    Patient Condition: [facility] with NP, RN, and patient and spouse. I met with patient at the bedside for inpatient rehabilitation assessment.  Patient will benefit from ongoing PT and OT, can actively participate in 3 hours of therapy a day 5 days of the week, and can make measurable gains during the admission.  Patient will also benefit from the coordinated team approach during an Inpatient Acute Rehabilitation admission.  The patient will receive intensive therapy as well as Rehabilitation physician, nursing, social worker, and care management interventions.  Due to bladder  management, bowel management, safety, skin/wound care, disease management, medication administration, pain management and patient education the patient requires 24 hour a day rehabilitation nursing.  The patient is currently Min A for transfers and Min G with mobility and Min A to Total A for basic ADLs.  Discharge setting and therapy post discharge at home with home health is anticipated.  Patient has agreed to participate in the Acute Inpatient Rehabilitation Program and will admit 12/07/19.   Preadmission Screen Completed By:  KRaechel Ache 12/07/2019 11:46 AM ______________________________________________________________________   Discussed status with Dr. PPosey Prontoon 12/07/19 at 11:45AM and received approval for admission today.   Admission Coordinator:  Raechel Ache, OT, time 11:45AM/Date 12/07/19.    Assessment/Plan: Diagnosis: cauda equina syndrome s/p lumbar laminectomy   1. Does the need for close, 24 hr/day Medical supervision in concert with the patient's rehab needs make it unreasonable for this patient to be served in a less intensive setting? Potentially 2. Co-Morbidities requiring supervision/potential complications: TB, CF, leukocytosis (repeat labs, cont to monitor for signs and symptoms of infection, further workup if indicated), hyperglycemia (likely steroid induced, Monitor in accordance with exercise and adjust meds as necessary) 3. Due to bladder management, bowel management, safety, skin/wound care, disease management, pain management and patient education, does the patient require 24 hr/day rehab nursing? Potentially 4. Does the patient require coordinated care of a physician, rehab nurse, PT, OT to address physical and functional deficits in the context of the above medical diagnosis(es)? Potentially Addressing deficits in the following areas: balance, endurance, locomotion, strength, transferring, bathing, dressing, toileting and psychosocial support 5. Can the patient  actively participate in an intensive therapy program of at least 3 hrs of therapy 5 days a week? Yes 6. The potential for patient to make measurable gains while on inpatient rehab is excellent 7. Anticipated functional outcomes upon discharge from inpatient rehab: modified independent PT, modified independent OT, n/a SLP 8. Estimated rehab length of stay to reach the above functional goals is: 4-6 days. 9. Anticipated discharge destination: Home 10. Overall Rehab/Functional Prognosis: excellent and good     MD Signature: Delice Lesch, MD, ABPMR        Revision History                          Note Details  Author Jamse Arn, MD File Time 12/07/2019 11:53 AM  Author Type Physician Status Signed  Last Editor Jamse Arn, MD Service Physical Medicine and Rehabilitation

## 2019-12-07 NOTE — Progress Notes (Signed)
Patient alert and oriented, mae's well, voiding via foley catheter with adequate amount of urine, swallowing without difficulty, no c/o pain at time of discharge to rehab unit. Patient and family stated understanding of instructions given. Patient has an appointment with Dr. Venetia Maxon for surgical check up

## 2019-12-07 NOTE — H&P (Signed)
Physical Medicine and Rehabilitation Admission H&P    Chief Complaint  Patient presents with  . Functional deficits    HPI: Melanie Baldwin is a 34 year old female with history LBP that started worsening during pregnancy and had progressive symptoms with pain radiating down BLE and multiple ED visits Sept. prescription for review patient, and husband.  She was treated for radiculopathy but continued to worsen till bed bound for days PTA--was waiting insurance clearance for MRI spine.  She was admitted on 12/04/2019 with low back pain, urinary incontinence, lower extremity numbness, and inability to stand.  MRI lumbar spine showed large extruded disc fragment in midline at L4-L5 with compression of thecal sac and severe spinal stenosis.  She was evaluated by Dr. Venetia Maxon and found to have loss of rectal tone and saddle anesthesia due to cauda equina syndrome and surgical decompression recommended.  She was taken to the OR on 12/04/2019 for bilateral L4--L5 decompression with resection of ruptured disc.  Hospital course further complicated by associated paresthesias.  Postop has had improvement in BLE but continues to have paresthesias as well as BLE weakness with back precautions affecting ADLs and mobility.  CIR was recommended due to functional decline.  Please see preadmission assessment from earlier today as well.   Review of Systems  Constitutional: Positive for malaise/fatigue. Negative for chills and fever.  HENT: Negative for hearing loss.   Eyes: Negative for blurred vision and double vision.  Respiratory: Negative for shortness of breath.   Cardiovascular: Negative for chest pain.  Gastrointestinal: Positive for constipation (Has not had BM  for days prior to admission). Negative for abdominal pain and nausea.  Genitourinary: Negative for dysuria and urgency.       Unable to urinarte  Musculoskeletal: Positive for myalgias.  Skin: Negative for rash.  Neurological: Positive for sensory  change, focal weakness and weakness. Negative for dizziness and headaches.  Psychiatric/Behavioral: The patient is nervous/anxious.   All other systems reviewed and are negative.     Past Medical History:  Diagnosis Date  . Cystic fibrosis carrier   . Normal pregnancy 09/24/2010  . TB (pulmonary tuberculosis)    took med for 6 months currently negative    Past Surgical History:  Procedure Laterality Date  . LUMBAR LAMINECTOMY/DECOMPRESSION MICRODISCECTOMY Bilateral 12/04/2019   Procedure: Bilateral Lumbar Four-Five Microdiscectomy;  Surgeon: Maeola Harman, MD;  Location: [facility] OR;  Service: Neurosurgery;  Laterality: Bilateral;    Family History  Problem Relation Age of Onset  . Diabetes Paternal Grandmother     Social History:  Married. Lives with family and independent prior to Oct. Was bed bound for a couple of weeks PTA. She worked in a [employer], drilling. She reports that she has never smoked. She has never used smokeless tobacco. She reports that she does not drink alcohol and does not use drugs.    Allergies: No Known Allergies    Medications Prior to Admission  Medication Sig Dispense Refill  . ibuprofen (ADVIL) 600 MG tablet Take 1 tablet (600 mg total) by mouth every 6 (six) hours as needed for moderate pain. 30 tablet 0  . meloxicam (MOBIC) 15 MG tablet Take 15 mg by mouth daily.     . methocarbamol (ROBAXIN) 500 MG tablet Take 1 tablet (500 mg total) by mouth every 12 (twelve) hours as needed for muscle spasms. 20 tablet 0  . traMADol (ULTRAM) 50 MG tablet Take 50-100 mg by mouth 4 (four) times daily as needed for moderate pain.     Marland Kitchen  predniSONE (DELTASONE) 20 MG tablet Take 2 tablets (40 mg total) by mouth daily. Take 40 mg by mouth daily for 3 days, then 20mg  by mouth daily for 3 days, then 10mg  daily for 3 days (Patient taking differently: Take 10-40 mg by mouth See admin instructions. Take 40 mg by mouth daily for 3 days, then 20mg  by mouth daily for 3 days, then  10mg  daily for 3 days) 12 tablet 0    Drug Regimen Review  Drug regimen was reviewed and remains appropriate with no significant issues identified  Home: Home Living Family/patient expects to be discharged to:: Private residence Living Arrangements: Spouse/significant other, Children (1 daughter 9yo, one daughter 41mos) Available Help at Discharge: Family, Available PRN/intermittently Type of Home: Apartment Home Access: Stairs to enter of Steps: 1 Entrance Stairs-Rails: None Home Layout: One level Bathroom Shower/Tub: : None Additional Comments: lives with spouse, who works during the day, 64 mos old and 87 y.o. daughters    Functional History: Prior Function Level of Independence: Needs assistance Gait / Transfers Assistance Needed: Pt reports she has been limited with her ability to ambulate and has been holding onto walls and furniture for ~2 mos PTA  ADL's / Homemaking Assistance Needed: Pt reports spouse performed IADLs and child care tasks for the past 2 mos due to pain and weakness   Functional Status:  Mobility: Bed Mobility Overal bed mobility: Needs Assistance Bed Mobility: Rolling, Sidelying to Sit Rolling: Min assist Sidelying to sit: Mod assist Sit to sidelying: Mod assist General bed mobility comments: VC for technique, MinA to roll completely on to her side and then ModA to push trunk to upright from sidelying Transfers Overall transfer level: Needs assistance Equipment used: Rolling walker (2 wheeled) Transfers: Sit to/from Stand, Stand Pivot Transfers Sit to Stand: Min guard Stand pivot transfers: Min guard General transfer comment: min guard to boost to full upright position and gain balance, VC for appropriate technique given back precautions Ambulation/Gait Ambulation/Gait assistance: Min guard Gait Distance (Feet): 160 Feet Assistive device: Rolling walker (2 wheeled) Gait Pattern/deviations:  Step-through pattern, Decreased step length - right, Decreased step length - left, Decreased stride length, Narrow base of support, Antalgic, Decreased dorsiflexion - right, Decreased dorsiflexion - left General Gait Details: more confident with mobility today, no ataxia noted but still discoordinated steps with poor dorsiflexion/heel strike and still wtih some tendency for R inversion in stance phase. Cues to relax shoulders and for upright posture, back precautions especially with turning. Mild B knee bucklng L>R but able to control with BUE support. Needed one standing rest break. Gait velocity: quite slow    ADL: ADL Overall ADL's : Needs assistance/impaired Eating/Feeding: Independent Grooming: Wash/dry hands, Wash/dry face, Oral care, Brushing hair, Set up, Sitting Upper Body Bathing: Set up, Sitting Lower Body Bathing: Maximal assistance, Sit to/from stand Upper Body Dressing : Set up, Sitting Lower Body Dressing: Moderate assistance, Sit to/from stand Toilet Transfer: Minimal assistance, RW Toileting- Clothing Manipulation and Hygiene: Maximal assistance, Sit to/from stand Toileting - Clothing Manipulation Details (indicate cue type and reason): Pt with impaired bowel and bladder function  Functional mobility during ADLs: Min guard, Rolling walker  Cognition: Cognition Overall Cognitive Status: Within Functional Limits for tasks assessed Orientation Level: Oriented X4 Cognition Arousal/Alertness: Awake/alert Behavior During Therapy: WFL for tasks assessed/performed Overall Cognitive Status: Within Functional Limits for tasks assessed General Comments: very pleasant and extremely motivated today, good carryover of safety and back precautions from yesterday  Blood pressure 103/68, pulse 84, temperature 98.7 F (37.1 C), temperature source Oral, resp. rate 18, height 5\' 4"  (1.626 m), weight 77.1 kg, last menstrual period 11/10/2019, SpO2 99 %, unknown if currently  breastfeeding. Physical Exam Vitals and nursing note reviewed.  Constitutional:      General: She is not in acute distress.    Appearance: Normal appearance.  HENT:     Head: Normocephalic and atraumatic.     Right Ear: External ear normal.     Left Ear: External ear normal.     Nose: Nose normal.  Eyes:     General:        Right eye: No discharge.        Left eye: No discharge.     Extraocular Movements: Extraocular movements intact.  Cardiovascular:     Rate and Rhythm: Normal rate and regular rhythm.  Pulmonary:     Effort: Pulmonary effort is normal. No respiratory distress.     Breath sounds: No stridor.  Abdominal:     General: Abdomen is flat. Bowel sounds are normal. There is no distension.  Musculoskeletal:     Cervical back: Normal range of motion and neck supple.     Comments: No edema or tenderness in extremities  Skin:    General: Skin is warm and dry.     Comments: Lower back incision with dry honeycomb dressing.   Neurological:     Mental Status: She is alert and oriented to person, place, and time.     Comments: Alert Motor: Bilateral upper extremities: 5/5 proximal to distal B/l LE: HF, KE 4-4+/5, ADF 4+/5 Sensation diminished to light touch b/l LE  Psychiatric:        Mood and Affect: Mood normal.        Behavior: Behavior normal.        Thought Content: Thought content normal.    No results found for this or any previous visit (from the past 48 hour(s)). No results found.  Medical Problem List and Plan: 1.  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  -patient may shower with incision covered  -ELOS/Goals: 7-10 days/Supervision/Mod I  Admit to CIR 2.  Antithrombotics: -DVT/anticoagulation:  Mechanical: Sequential compression devices, below knee Bilateral lower extremities  -antiplatelet therapy: N/A 3. Pain Management: Mobic twice daily with hydrocodone as  needed.  Monitor with increased exertion for post-operative and neuropathic pain 4. Mood: LCSW to follow for evaluation and support  -antipsychotic agents: N/A 5. Neuropsych: This patient is capable of making decisions on her own behalf. 6. Skin/Wound Care: Monitor wound for healing.  Routine pressure-relief measures. 7. Fluids/Electrolytes/Nutrition: Monitor I/Os.    CMP ordered 8.  Fasting hyperglycemia: Likely due to prior steroids and/or IV dextrose--question impaired fasting glucose?    Blood glucose ordered 9.  Neurogenic bowel: Reports that she has not had BM for days prior to admission 11/09. KUB ordered to check stool burden and decide on fleets v/s SSE. Dose of Miralax post admission. Changed colace to Senna-S 2 tabs in am followed by suppository at nights.   10. Neurogenic bladder: Continue foely--d/c in am pending + results with laxatives  13/09, PA-C 12/07/2019  I have personally performed a face to face diagnostic evaluation, including, but not limited to relevant history and physical exam findings, of this patient and developed relevant assessment and plan.  Additionally, I have reviewed and concur with the physician assistant's documentation above.  Yoltzin Barg 13/12/2019,  MD, ABPMR

## 2019-12-07 NOTE — Progress Notes (Addendum)
Subjective: Patient reports that she is doing well overall. Strength has slightly improved, but numbness persists in her BLE.  Objective: Vital signs in last 24 hours: Temp:  [97.9 F (36.6 C)-98.7 F (37.1 C)] 98.7 F (37.1 C) (11/12 0749) Pulse Rate:  [72-87] 84 (11/12 0749) Resp:  [18] 18 (11/12 0749) BP: (103-122)/(57-81) 103/68 (11/12 0749) SpO2:  [98 %-100 %] 99 % (11/12 0749)  Intake/Output from previous day: 11/11 0701 - 11/12 0700 In: 600 [P.O.:600] Out: 2300 [Urine:2300] Intake/Output this shift: No intake/output data recorded.  Physical Exam: Patient is A/O X 4 and conversant. Pt reports that she feels as though her strength is about the same as yesterday and continues to be unable to ambulate without assistance. Foley catheter present for urinary retention. Saddle anesthesia persists. Dressing is CDI.  Lab Results: Recent Labs    12/04/19 1106  WBC 15.6*  HGB 13.0  HCT 40.5  PLT 227   BMET Recent Labs    12/04/19 1106  NA 136  K 4.0  CL 102  CO2 23  GLUCOSE 136*  BUN 19  CREATININE 0.62  CALCIUM 9.2    Studies/Results: No results found.  Assessment/Plan: Patient has had slight improvement in strength over the last few days, but saddle anesthesia persists. She is awaiting CIR approval for CIR.  LOS: 3 days     Council Mechanic, DNP, NP-C 12/07/2019, 7:57 AM   Patient to Rehab today.

## 2019-12-07 NOTE — H&P (Signed)
Physical Medicine and Rehabilitation Admission H&P    Chief Complaint  Patient presents with  . Functional deficits    HPI: Melanie Baldwin is a 34 year old female with history LBP that started worsening during pregnancy and had progressive symptoms with pain radiating down BLE and multiple ED visits Sept. prescription for review patient, and husband.  She was treated for radiculopathy but continued to worsen till bed bound for days PTA--was waiting insurance clearance for MRI spine.  She was admitted on 12/04/2019 with low back pain, urinary incontinence, lower extremity numbness, and inability to stand.  MRI lumbar spine showed large extruded disc fragment in midline at L4-L5 with compression of thecal sac and severe spinal stenosis.  She was evaluated by Dr. Venetia Maxon and found to have loss of rectal tone and saddle anesthesia due to cauda equina syndrome and surgical decompression recommended.  She was taken to the OR on 12/04/2019 for bilateral L4--L5 decompression with resection of ruptured disc.  Hospital course further complicated by associated paresthesias.  Postop has had improvement in BLE but continues to have paresthesias as well as BLE weakness with back precautions affecting ADLs and mobility.  CIR was recommended due to functional decline.  Please see preadmission assessment from earlier today as well.   Review of Systems  Constitutional: Positive for malaise/fatigue. Negative for chills and fever.  HENT: Negative for hearing loss.   Eyes: Negative for blurred vision and double vision.  Respiratory: Negative for shortness of breath.   Cardiovascular: Negative for chest pain.  Gastrointestinal: Positive for constipation (Has not had BM  for days prior to admission). Negative for abdominal pain and nausea.  Genitourinary: Negative for dysuria and urgency.       Unable to urinarte  Musculoskeletal: Positive for myalgias.  Skin: Negative for rash.  Neurological: Positive for sensory  change, focal weakness and weakness. Negative for dizziness and headaches.  Psychiatric/Behavioral: The patient is nervous/anxious.   All other systems reviewed and are negative.     Past Medical History:  Diagnosis Date  . Cystic fibrosis carrier   . Normal pregnancy 09/24/2010  . TB (pulmonary tuberculosis)    took med for 6 months currently negative    Past Surgical History:  Procedure Laterality Date  . LUMBAR LAMINECTOMY/DECOMPRESSION MICRODISCECTOMY Bilateral 12/04/2019   Procedure: Bilateral Lumbar Four-Five Microdiscectomy;  Surgeon: Maeola Harman, MD;  Location: [facility] OR;  Service: Neurosurgery;  Laterality: Bilateral;    Family History  Problem Relation Age of Onset  . Diabetes Paternal Grandmother     Social History:  Married. Lives with family and independent prior to Oct. Was bed bound for a couple of weeks PTA. She worked in a [employer], drilling. She reports that she has never smoked. She has never used smokeless tobacco. She reports that she does not drink alcohol and does not use drugs.    Allergies: No Known Allergies    Medications Prior to Admission  Medication Sig Dispense Refill  . ibuprofen (ADVIL) 600 MG tablet Take 1 tablet (600 mg total) by mouth every 6 (six) hours as needed for moderate pain. 30 tablet 0  . meloxicam (MOBIC) 15 MG tablet Take 15 mg by mouth daily.     . methocarbamol (ROBAXIN) 500 MG tablet Take 1 tablet (500 mg total) by mouth every 12 (twelve) hours as needed for muscle spasms. 20 tablet 0  . traMADol (ULTRAM) 50 MG tablet Take 50-100 mg by mouth 4 (four) times daily as needed for moderate pain.     Marland Kitchen  predniSONE (DELTASONE) 20 MG tablet Take 2 tablets (40 mg total) by mouth daily. Take 40 mg by mouth daily for 3 days, then 20mg  by mouth daily for 3 days, then 10mg  daily for 3 days (Patient taking differently: Take 10-40 mg by mouth See admin instructions. Take 40 mg by mouth daily for 3 days, then 20mg  by mouth daily for 3 days, then  10mg  daily for 3 days) 12 tablet 0    Drug Regimen Review  Drug regimen was reviewed and remains appropriate with no significant issues identified  Home: Home Living Family/patient expects to be discharged to:: Private residence Living Arrangements: Spouse/significant other, Children (1 daughter 9yo, one daughter 63mos) Available Help at Discharge: Family, Available PRN/intermittently Type of Home: Apartment Home Access: Stairs to enter of Steps: 1 Entrance Stairs-Rails: None Home Layout: One level Bathroom Shower/Tub: : None Additional Comments: lives with spouse, who works during the day, 41 mos old and 4 y.o. daughters    Functional History: Prior Function Level of Independence: Needs assistance Gait / Transfers Assistance Needed: Pt reports she has been limited with her ability to ambulate and has been holding onto walls and furniture for ~2 mos PTA  ADL's / Homemaking Assistance Needed: Pt reports spouse performed IADLs and child care tasks for the past 2 mos due to pain and weakness   Functional Status:  Mobility: Bed Mobility Overal bed mobility: Needs Assistance Bed Mobility: Rolling, Sidelying to Sit Rolling: Min assist Sidelying to sit: Mod assist Sit to sidelying: Mod assist General bed mobility comments: VC for technique, MinA to roll completely on to her side and then ModA to push trunk to upright from sidelying Transfers Overall transfer level: Needs assistance Equipment used: Rolling walker (2 wheeled) Transfers: Sit to/from Stand, Stand Pivot Transfers Sit to Stand: Min guard Stand pivot transfers: Min guard General transfer comment: min guard to boost to full upright position and gain balance, VC for appropriate technique given back precautions Ambulation/Gait Ambulation/Gait assistance: Min guard Gait Distance (Feet): 160 Feet Assistive device: Rolling walker (2 wheeled) Gait Pattern/deviations:  Step-through pattern, Decreased step length - right, Decreased step length - left, Decreased stride length, Narrow base of support, Antalgic, Decreased dorsiflexion - right, Decreased dorsiflexion - left General Gait Details: more confident with mobility today, no ataxia noted but still discoordinated steps with poor dorsiflexion/heel strike and still wtih some tendency for R inversion in stance phase. Cues to relax shoulders and for upright posture, back precautions especially with turning. Mild B knee bucklng L>R but able to control with BUE support. Needed one standing rest break. Gait velocity: quite slow    ADL: ADL Overall ADL's : Needs assistance/impaired Eating/Feeding: Independent Grooming: Wash/dry hands, Wash/dry face, Oral care, Brushing hair, Set up, Sitting Upper Body Bathing: Set up, Sitting Lower Body Bathing: Maximal assistance, Sit to/from stand Upper Body Dressing : Set up, Sitting Lower Body Dressing: Moderate assistance, Sit to/from stand Toilet Transfer: Minimal assistance, RW Toileting- Clothing Manipulation and Hygiene: Maximal assistance, Sit to/from stand Toileting - Clothing Manipulation Details (indicate cue type and reason): Pt with impaired bowel and bladder function  Functional mobility during ADLs: Min guard, Rolling walker  Cognition: Cognition Overall Cognitive Status: Within Functional Limits for tasks assessed Orientation Level: Oriented X4 Cognition Arousal/Alertness: Awake/alert Behavior During Therapy: WFL for tasks assessed/performed Overall Cognitive Status: Within Functional Limits for tasks assessed General Comments: very pleasant and extremely motivated today, good carryover of safety and back precautions from yesterday  Blood pressure 103/68, pulse 84, temperature 98.7 F (37.1 C), temperature source Oral, resp. rate 18, height 5\' 4"  (1.626 m), weight 77.1 kg, last menstrual period 11/10/2019, SpO2 99 %, unknown if currently  breastfeeding. Physical Exam Vitals and nursing note reviewed.  Constitutional:      General: She is not in acute distress.    Appearance: Normal appearance.  HENT:     Head: Normocephalic and atraumatic.     Right Ear: External ear normal.     Left Ear: External ear normal.     Nose: Nose normal.  Eyes:     General:        Right eye: No discharge.        Left eye: No discharge.     Extraocular Movements: Extraocular movements intact.  Cardiovascular:     Rate and Rhythm: Normal rate and regular rhythm.  Pulmonary:     Effort: Pulmonary effort is normal. No respiratory distress.     Breath sounds: No stridor.  Abdominal:     General: Abdomen is flat. Bowel sounds are normal. There is no distension.  Musculoskeletal:     Cervical back: Normal range of motion and neck supple.     Comments: No edema or tenderness in extremities  Skin:    General: Skin is warm and dry.     Comments: Lower back incision with dry honeycomb dressing.   Neurological:     Mental Status: She is alert and oriented to person, place, and time.     Comments: Alert Motor: Bilateral upper extremities: 5/5 proximal to distal B/l LE: HF, KE 4-4+/5, ADF 4+/5 Sensation diminished to light touch b/l LE  Psychiatric:        Mood and Affect: Mood normal.        Behavior: Behavior normal.        Thought Content: Thought content normal.    No results found for this or any previous visit (from the past 48 hour(s)). No results found.  Medical Problem List and Plan: 1.  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  -patient may shower with incision covered  -ELOS/Goals: 7-10 days/Supervision/Mod I  Admit to CIR 2.  Antithrombotics: -DVT/anticoagulation:  Mechanical: Sequential compression devices, below knee Bilateral lower extremities  -antiplatelet therapy: N/A 3. Pain Management: Mobic twice daily with hydrocodone as  needed.  Monitor with increased exertion for post-operative and neuropathic pain 4. Mood: LCSW to follow for evaluation and support  -antipsychotic agents: N/A 5. Neuropsych: This patient is capable of making decisions on her own behalf. 6. Skin/Wound Care: Monitor wound for healing.  Routine pressure-relief measures. 7. Fluids/Electrolytes/Nutrition: Monitor I/Os.    CMP ordered 8.  Fasting hyperglycemia: Likely due to prior steroids and/or IV dextrose--question impaired fasting glucose?    Blood glucose ordered 9.  Neurogenic bowel: Reports that she has not had BM for days prior to admission 11/09. KUB ordered to check stool burden and decide on fleets v/s SSE. Dose of Miralax post admission. Changed colace to Senna-S 2 tabs in am followed by suppository at nights.   10. Neurogenic bladder: Continue foely--d/c in am pending + results with laxatives  13/09, PA-C 12/07/2019  I have personally performed a face to face diagnostic evaluation, including, but not limited to relevant history and physical exam findings, of this patient and developed relevant assessment and plan.  Additionally, I have reviewed and concur with the physician assistant's documentation above.  Melanie Baldwin 13/12/2019,  MD, ABPMR  The patient's status has not changed. Any changes from the pre-admission screening or documentation from the acute chart are noted above.   Melanie MorrowAnkit Myrakle Wingler, MD, ABPMR

## 2019-12-07 NOTE — Progress Notes (Signed)
Inpatient Rehabilitation-Admissions Coordinator   Received medical clearance from attending service for admit to CIR today. Pt notified of bed offer and she has accepted. Reviewed insurance letter and consent form signed. All questions answered.   RN and [facility] team notified of plan for admit today.   Cheri Rous, OTR/L  Rehab Admissions Coordinator  [phone] 12/07/2019 11:37 AM

## 2019-12-08 ENCOUNTER — Inpatient Hospital Stay (HOSPITAL_COMMUNITY): Payer: Medicaid Other | Admitting: Occupational Therapy

## 2019-12-08 ENCOUNTER — Inpatient Hospital Stay (HOSPITAL_COMMUNITY): Payer: Medicaid Other | Admitting: Physical Therapy

## 2019-12-08 DIAGNOSIS — S343XXD Injury of cauda equina, subsequent encounter: Secondary | ICD-10-CM

## 2019-12-08 LAB — CBC WITH DIFFERENTIAL/PLATELET
Abs Immature Granulocytes: 0.03 10*3/uL (ref 0.00–0.07)
Basophils Absolute: 0 10*3/uL (ref 0.0–0.1)
Basophils Relative: 0 %
Eosinophils Absolute: 0.5 10*3/uL (ref 0.0–0.5)
Eosinophils Relative: 5 %
HCT: 37.9 % (ref 36.0–46.0)
Hemoglobin: 11.9 g/dL — ABNORMAL LOW (ref 12.0–15.0)
Immature Granulocytes: 0 %
Lymphocytes Relative: 19 %
Lymphs Abs: 1.7 10*3/uL (ref 0.7–4.0)
MCH: 26.8 pg (ref 26.0–34.0)
MCHC: 31.4 g/dL (ref 30.0–36.0)
MCV: 85.4 fL (ref 80.0–100.0)
Monocytes Absolute: 0.6 10*3/uL (ref 0.1–1.0)
Monocytes Relative: 6 %
Neutro Abs: 6.4 10*3/uL (ref 1.7–7.7)
Neutrophils Relative %: 70 %
Platelets: 186 10*3/uL (ref 150–400)
RBC: 4.44 MIL/uL (ref 3.87–5.11)
RDW: 14.6 % (ref 11.5–15.5)
WBC: 9.3 10*3/uL (ref 4.0–10.5)
nRBC: 0 % (ref 0.0–0.2)

## 2019-12-08 LAB — COMPREHENSIVE METABOLIC PANEL
ALT: 11 U/L (ref 0–44)
AST: 14 U/L — ABNORMAL LOW (ref 15–41)
Albumin: 3.5 g/dL (ref 3.5–5.0)
Alkaline Phosphatase: 54 U/L (ref 38–126)
Anion gap: 11 (ref 5–15)
BUN: 15 mg/dL (ref 6–20)
CO2: 24 mmol/L (ref 22–32)
Calcium: 9 mg/dL (ref 8.9–10.3)
Chloride: 101 mmol/L (ref 98–111)
Creatinine, Ser: 0.55 mg/dL (ref 0.44–1.00)
GFR, Estimated: >60 mL/min (ref >60)
Glucose, Bld: 109 mg/dL — ABNORMAL HIGH (ref 70–99)
Potassium: 4.1 mmol/L (ref 3.5–5.1)
Sodium: 136 mmol/L (ref 135–145)
Total Bilirubin: 0.9 mg/dL (ref 0.3–1.2)
Total Protein: 6.5 g/dL (ref 6.5–8.1)

## 2019-12-08 MED ORDER — HYDROCERIN EX CREA
TOPICAL_CREAM | Freq: Two times a day (BID) | CUTANEOUS | Status: DC
  Administered 2019-12-08 – 2019-12-23 (×7): 1 via TOPICAL
  Filled 2019-12-08: qty 113

## 2019-12-08 MED ORDER — CHLORHEXIDINE GLUCONATE CLOTH 2 % EX PADS
6.0000 | MEDICATED_PAD | Freq: Two times a day (BID) | CUTANEOUS | Status: DC
  Administered 2019-12-08 – 2019-12-10 (×4): 6 via TOPICAL

## 2019-12-08 NOTE — Evaluation (Signed)
Occupational Therapy Assessment and Plan  Patient Details  Name: Melanie Baldwin MRN: [id number] Date of Birth: 1985-11-03  OT Diagnosis: lumbago (low back pain) and muscle weakness (generalized) Rehab Potential: Rehab Potential (ACUTE ONLY): Excellent ELOS: 7-10 days   Today's Date: 12/08/2019 OT Individual Time: 1300-1415 OT Individual Time Calculation (min): 75 min     Hospital Problem: Principal Problem:   Cauda equina spinal cord injury [facility])   Past Medical History:  Past Medical History:  Diagnosis Date  . Cystic fibrosis carrier   . Normal pregnancy 09/24/2010  . TB (pulmonary tuberculosis)    took med for 6 months currently negative   Past Surgical History:  Past Surgical History:  Procedure Laterality Date  . LUMBAR LAMINECTOMY/DECOMPRESSION MICRODISCECTOMY Bilateral 12/04/2019   Procedure: Bilateral Lumbar Four-Five Microdiscectomy;  Surgeon: Erline Levine, MD;  Location: [place];  Service: Neurosurgery;  Laterality: Bilateral;    Assessment & Plan Clinical Impression: Melanie Baldwin is a 34 year old female with history LBP that started worsening during pregnancy and had progressive symptoms with pain radiating down BLE and multiple ED visits Sept. prescription for review patient, and husband.  She was treated for radiculopathy but continued to worsen till bed bound for days PTA--was waiting insurance clearance for MRI spine.  She was admitted on 12/04/2019 with low back pain, urinary incontinence, lower extremity numbness, and inability to stand.  MRI lumbar spine showed large extruded disc fragment in midline at L4-L5 with compression of thecal sac and severe spinal stenosis.  She was evaluated by Dr. Vertell Limber and found to have loss of rectal tone and saddle anesthesia due to cauda equina syndrome and surgical decompression recommended.  She was taken to the OR on 12/04/2019 for bilateral L4--L5 decompression with resection of ruptured disc.  Hospital course further complicated by  associated paresthesias.  Postop has had improvement in BLE but continues to have paresthesias as well as BLE weakness with back precautions affecting ADLs and mobility.  CIR was recommended due to functional decline.  Please see preadmission assessment from earlier today as well.    Patient currently requires min with basic self-care skills secondary to muscle weakness, decreased cardiorespiratoy endurance, unbalanced muscle activation and decreased standing balance.  Prior to hospitalization, patient could complete BADLs with independent  approximately 3 months ago.   Patient will benefit from skilled intervention to increase independence with basic self-care skills prior to discharge home with spouse and 2 daughters.  Anticipate patient will require intermittent supervision and follow up home health.  OT - End of Session Endurance Deficit: Yes Endurance Deficit Description: Pt with limited standing endurance during self care participation OT Assessment Rehab Potential (ACUTE ONLY): Excellent OT Barriers to Discharge: Decreased caregiver support;Incontinence;Lack of/limited family support OT Patient demonstrates impairments in the following area(s): Balance;Safety;Sensory;Skin Integrity;Endurance;Motor;Pain OT Basic ADL's Functional Problem(s): Grooming;Bathing;Dressing;Toileting OT Advanced ADL's Functional Problem(s): Other (comment) (child care) OT Transfers Functional Problem(s): Toilet;Tub/Shower OT Additional Impairment(s): None OT Plan OT Intensity: Minimum of 1-2 x/day, 45 to 90 minutes OT Frequency: 5 out of 7 days OT Duration/Estimated Length of Stay: 7-10 days OT Treatment/Interventions: Balance/vestibular training;Discharge planning;Community reintegration;DME/adaptive equipment instruction;Disease mangement/prevention;Neuromuscular re-education;Psychosocial support;Functional mobility training;Patient/family education;Pain management;Self Care/advanced ADL retraining;Therapeutic  Activities;Therapeutic Exercise;UE/LE Strength taining/ROM OT Self Feeding Anticipated Outcome(s): No goal OT Basic Self-Care Anticipated Outcome(s): Supervision/setup-Mod I OT Toileting Anticipated Outcome(s): Mod I OT Bathroom Transfers Anticipated Outcome(s): Supervision/setup-Mod I OT Recommendation Recommendations for Other Services: Neuropsych consult;Therapeutic Recreation consult Therapeutic Recreation Interventions: Clinical cytogeneticist;Other (comment) (child care) Patient destination: Home  Follow Up Recommendations: Home health OT Equipment Recommended: To be determined   OT Evaluation Precautions/Restrictions  Precautions Precautions: Back Precaution Comments: per MD orders, no brace needed Vital Signs Therapy Vitals Temp: 98.5 F (36.9 C) Pulse Rate: (!) 107 Resp: 19 BP: 106/60 Patient Position (if appropriate): Lying Oxygen Therapy SpO2: 99 % O2 Device: Room Air Home Living/Prior [place] expects to be discharged to:: Private residence Living Arrangements: Spouse/significant other Available Help at Discharge: Family, Available PRN/intermittently (spouse works) Type of Home: Apartment Home Access: Stairs to enter Technical brewer of Steps: 1 Entrance Stairs-Rails: None Home Layout: One level Bathroom Shower/Tub: Chiropodist: Standard Bathroom Accessibility: Yes Additional Comments: per pt report, no stairs to enter, however, chart review reports 1 stair to enter with no handrails.  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 Leisure and Hobbies: Loves her work at [employer] Prior Function Level of Independence: Needs  assistance with ADLs, Needs assistance with homemaking, Needs assistance with tranfers (For the past 2 months, pt required spouse assist for all ADL/IADL tasks and transfers)  Able to Take Stairs?: No Driving: No Vision Baseline Vision/History: No visual deficits Patient Visual Report: No change from baseline Perception  Perception: Within Functional Limits Praxis Praxis: Intact Cognition Overall Cognitive Status: Within Functional Limits for tasks assessed Arousal/Alertness: Awake/alert Orientation Level: Place;Situation;Person Person: Oriented Place: Oriented Situation: Oriented Year: 2021 Month: November Day of Week: Correct Memory: Appears intact Immediate Memory Recall: Sock;Blue;Bed Memory Recall Sock: Without Cue Memory Recall Blue: Without Cue Memory Recall Bed: Without Cue Attention: Sustained Sustained Attention: Appears intact Safety/Judgment: Appears intact Sensation Sensation Light Touch: Impaired Detail Light Touch Impaired Details: Impaired LLE;Impaired RLE (also numbness in saddle distribution of pelvis) Proprioception: Appears Intact Additional Comments: (pt able to identify when stimulus is applied and which LE, however, reports paresthesias into BLE (primarly feet and L hip/buttock) Coordination Gross Motor Movements are Fluid and Coordinated: Yes Fine Motor Movements are Fluid and Coordinated: Yes Finger Nose Finger Test: WNL bilaterally Motor  Motor Motor: Paraplegia  Trunk/Postural Assessment  Cervical Assessment Cervical Assessment: Within Functional Limits Thoracic Assessment Thoracic Assessment: Within Functional Limits (back precautions) Lumbar Assessment Lumbar Assessment: Exceptions to [facility] (back precautions) Postural Control Postural Control: Within Functional Limits  Balance Balance Balance Assessed: Yes Dynamic Sitting Balance Dynamic Sitting - Balance Support: Feet supported;During functional activity;No upper extremity supported  (donning footwear while seated EOB) Dynamic Sitting - Level of Assistance: 5: Stand by assistance Dynamic Standing Balance Dynamic Standing - Balance Support: No upper extremity supported;During functional activity Dynamic Standing - Level of Assistance: 4: Min assist (LB dressing tasks) Extremity/Trunk Assessment RUE Assessment RUE Assessment: Within Functional Limits Active Range of Motion (AROM) Comments: WNL LUE Assessment LUE Assessment: Within Functional Limits Active Range of Motion (AROM) Comments: WNL  Care Tool Care Tool Self Care Eating   Eating Assist Level: Set up assist    Oral Care    Oral Care Assist Level: Contact Guard/Toucning assist (standing)    Bathing   Body parts bathed by patient: Right arm;Left arm;Chest;Front perineal area;Right upper leg;Left upper leg;Right lower leg;Left lower leg;Face;Abdomen Body parts bathed by helper: Buttocks   Assist Level: Minimal Assistance - Patient > 75%    Upper Body  Dressing(including orthotics)   What is the patient wearing?: Pull over shirt   Assist Level: Set up assist    Lower Body Dressing (excluding footwear)   What is the patient wearing?: Incontinence brief;Pants Assist for lower body dressing: Minimal Assistance - Patient > 75%    Putting on/Taking off footwear   What is the patient wearing?: Non-skid slipper socks Assist for footwear: Supervision/Verbal cueing       Care Tool Toileting Toileting activity Toileting Activity did not occur (Clothing management and hygiene only): N/A (no void or bm)       Care Tool Bed Mobility Roll left and right activity   Roll left and right assist level: Independent with assistive device Roll left and right assistive device comment: bed rails  Sit to lying activity   Sit to lying assist level: Independent    Lying to sitting edge of bed activity   Lying to sitting edge of bed assist level: Minimal Assistance - Patient > 75%       Toilet transfer   Assist  Level: Minimal Assistance - Patient > 75%     Care Tool Cognition Expression of Ideas and Wants Expression of Ideas and Wants: Without difficulty (complex and basic) - expresses complex messages without difficulty and with speech that is clear and easy to understand   Understanding Verbal and Non-Verbal Content Understanding Verbal and Non-Verbal Content: Understands (complex and basic) - clear comprehension without cues or repetitions   Memory/Recall Ability *first 3 days only Memory/Recall Ability *first 3 days only: Current season;Location of own room;Staff names and faces;That he or she is in a hospital/hospital unit    Refer to Care Plan for [place] 1 OT Short Term Goal 1 (Week 1): STGs=LTGs due to ELOS  Recommendations for other services: Neuropsych and Surveyor, mining group, Stress management and Outing/community reintegration   Skilled Therapeutic Intervention Skilled OT session completed with focus on initial evaluation, education on OT role/POC, and establishment of patient-centered goals.   Pt greeted in bed, pain manageable for tx, able to recall her back precautions with min cuing. Supine<sit via logroll technique completed with supervision from flat bed without bedrail. She declined showering today, agreeable to complete bathing/dressing tasks EOB at sit<stand level using RW. Pt able to engage in ADL tasks with overall CGA for standing balance, seated figure 4 for washing feet and donning gripper socks. She did need A to thread her catheter into pants. Pt ambulated to the sink using RW with CGA, noted slow speed. While completing oral care in standing, pt had a bout of knee buckling requiring Min A to correct balance. She returned to EOB to sit for a bit and converse with therapist. After rest, she completed simulated shower + toilet transfer, using RW at ambulatory level with CGA-Min A. Pt returned to bed at close of session, provided  her with props to maintain sidelying position while adhering to her back precautions. Left her with all needs within reach, bed alarm set, and spouse at bedside.   ADL ADL Eating: Not assessed Grooming: Contact guard Where Assessed-Grooming: Standing at sink Upper Body Bathing: Setup Where Assessed-Upper Body Bathing: Edge of bed Lower Body Bathing: Minimal assistance Where Assessed-Lower Body Bathing: Edge of bed Upper Body Dressing: Setup Where Assessed-Upper Body Dressing: Edge of bed Lower Body Dressing: Minimal assistance Where Assessed-Lower Body Dressing: Edge of bed Toileting: Not assessed Toilet Transfer: Minimal assistance Toilet Transfer Method: Ambulating (RW) Armed forces technical officer  Equipment: Energy manager: Environmental education officer Method: Ambulating (RW)   Discharge Criteria: Patient will be discharged from OT if patient refuses treatment 3 consecutive times without medical reason, if treatment goals not met, if there is a change in medical status, if patient makes no progress towards goals or if patient is discharged from hospital.  The above assessment, treatment plan, treatment alternatives and goals were discussed and mutually agreed upon: by patient  Skeet Simmer 12/08/2019, 4:16 PM

## 2019-12-08 NOTE — Evaluation (Signed)
Physical Therapy Assessment and Plan  Patient Details  Name: Melanie Baldwin MRN: [id number] Date of Birth: 08-03-1985  PT Diagnosis: Abnormality of gait, Difficulty walking, Impaired sensation, Low back pain and Muscle weakness Rehab Potential: Excellent ELOS: 7-10 days   Today's Date: 12/08/2019 PT Individual Time: 0805-0920 PT Individual Time Calculation (min): 75 min    Hospital Problem: Principal Problem:   Cauda equina spinal cord injury [facility])   Past Medical History:  Past Medical History:  Diagnosis Date  . Cystic fibrosis carrier   . Normal pregnancy 09/24/2010  . TB (pulmonary tuberculosis)    took med for 6 months currently negative   Past Surgical History:  Past Surgical History:  Procedure Laterality Date  . LUMBAR LAMINECTOMY/DECOMPRESSION MICRODISCECTOMY Bilateral 12/04/2019   Procedure: Bilateral Lumbar Four-Five Microdiscectomy;  Surgeon: Erline Levine, MD;  Location: [place];  Service: Neurosurgery;  Laterality: Bilateral;    Assessment & Plan Clinical Impression: Patient is a 34 y.o. year old female with history LBP that started worsening during pregnancy and had progressive symptoms with pain radiating down BLE and multiple ED visits Sept. prescription for review patient, and husband.  She was treated for radiculopathy but continued to worsen till bed bound for days PTA--was waiting insurance clearance for MRI spine.  She was admitted on 12/04/2019 with low back pain, urinary incontinence, lower extremity numbness, and inability to stand.  MRI lumbar spine showed large extruded disc fragment in midline at L4-L5 with compression of thecal sac and severe spinal stenosis.  She was evaluated by Dr. Vertell Limber and found to have loss of rectal tone and saddle anesthesia due to cauda equina syndrome and surgical decompression recommended.  She was taken to the OR on 12/04/2019 for bilateral L4--L5 decompression with resection of ruptured disc.  Hospital course further complicated by  associated paresthesias.  Postop has had improvement in BLE but continues to have paresthesias as well as BLE weakness with back precautions affecting ADLs and mobility. Patient transferred to CIR on 12/07/2019 .   Patient currently requires min with mobility secondary to muscle weakness and muscle joint tightness, decreased cardiorespiratoy endurance, sensory deficits, decreased standing balance and decreased balance strategies.  Prior to hospitalization, patient was total/max with mobility and lived with Spouse, Daughter (lives with husband, 30moand 9y/o daughter[place]  Home access is  Stairs to enter.  Patient will benefit from skilled PT intervention to maximize safe functional mobility, minimize fall risk and decrease caregiver burden for planned discharge home with intermittent assist.  Anticipate patient will benefit from follow up OP at discharge.  PT - End of Session Activity Tolerance: Tolerates 30+ min activity with multiple rests Endurance Deficit: Yes PT Assessment Rehab Potential (ACUTE/IP ONLY): Excellent PT Barriers to Discharge Comments: Back precautions with 735mold baby, husband works during the day if assistance is needed PT Patient demonstrates impairments in the following area(s): Balance;Pain;Sensory;Motor;Endurance PT Transfers Functional Problem(s): Bed Mobility;Bed to Chair;Car;Furniture PT Locomotion Functional Problem(s): Ambulation;Stairs PT Plan PT Intensity: Minimum of 1-2 x/day ,45 to 90 minutes PT Frequency: 5 out of 7 days PT Duration Estimated Length of Stay: 7-10 days PT Treatment/Interventions: Ambulation/gait training;Community reintegration;Disease management/prevention;Neuromuscular re-education;Patient/family education;Skin care/wound management;Stair training;Therapeutic Exercise;UE/LE Coordination activities;UE/LE Strength taining/ROM;Therapeutic Activities;Splinting/orthotics;Pain management;DME/adaptive equipment instruction;Functional  mobility training;Discharge planning;Psychosocial support;Functional electrical stimulation;Balance/vestibular training PT Transfers Anticipated Outcome(s): modified independent PT Locomotion Anticipated Outcome(s): modified independent PT Recommendation Recommendations for Other Services: Therapeutic Recreation consult Therapeutic Recreation Interventions: Pet therapy;Outing/community reintergration;Kitchen group Follow Up Recommendations: Outpatient PT Patient destination: Home  Equipment Recommended: Rolling walker with 5" wheels;To be determined   PT Evaluation Precautions/Restrictions Precautions Precautions: Back Precaution Comments: cueing for back precautions. Restrictions Weight Bearing Restrictions: No General Chart Reviewed: Yes  Pain Pain Assessment Pain Scale: 0-10 Pain Score: 0-No pain Pain Descriptors / Indicators: Numbness (feels "itchy" everywhere, including her arms)  Home Living/Prior Functioning Home Living Available Help at Discharge: Family;Available PRN/intermittently Type of Home: Apartment Home Access: Stairs to enter Entrance Stairs-Number of Steps: 1 Entrance Stairs-Rails: None Home Layout: One level Bathroom Shower/Tub: Chiropodist: Standard Bathroom Accessibility: Yes Additional Comments: per pt report, no stairs to enter, however, chart review reports 1 stair to enter with no handrails.  Lives With: Spouse;Daughter (lives with husband, 22moand 9y/o daughter) Prior Function Level of Independence: Needs assistance with gait;Needs assistance with tranfers  Able to Take Stairs?: No Driving: No Vocation Requirements: pt had been working as an aEngineering geologist but for the last two months, has been unable to walk due to pain/numbness in her BLE Cognition Overall Cognitive Status: Within Functional Limits for tasks assessed Arousal/Alertness: Awake/alert Orientation Level: Oriented X4 Safety/Judgment: Appears  intact Sensation Sensation Light Touch: Impaired by gross assessment Proprioception: Appears Intact Additional Comments: (pt able to identify when stimulus is applied and which LE, however, reports paresthesias into BLE (primarly feet and L hip/buttock) Coordination Gross Motor Movements are Fluid and Coordinated: Yes (reports that legs feel heavy, but no apparent deficits with ambulation) Fine Motor Movements are Fluid and Coordinated: Not tested Motor  Motor Motor: Within Functional Limits  Trunk/Postural Assessment  Cervical Assessment Cervical Assessment: Within Functional Limits Thoracic Assessment Thoracic Assessment: Within Functional Limits Lumbar Assessment Lumbar Assessment: Ex[facility] to [facility](Pt able to recall back precautions when prompted) Postural Control Postural Control: Within Functional Limits  Balance Balance Balance Assessed: Yes Static Sitting Balance Static Sitting - Balance Support: Bilateral upper extremity supported Static Sitting - Level of Assistance: 6: Modified independent (Device/Increase time) Dynamic Sitting Balance Dynamic Sitting - Balance Support: Bilateral upper extremity supported Dynamic Sitting - Level of Assistance: 5: Stand by assistance Static Standing Balance Static Standing - Balance Support: Bilateral upper extremity supported Static Standing - Level of Assistance: 5: Stand by assistance Dynamic Standing Balance Dynamic Standing - Balance Support: Bilateral upper extremity supported Dynamic Standing - Level of Assistance: 4: Min assist Extremity Assessment  RLE Assessment RLE Assessment: Exceptions to [facility] Strength Comments: Pt grossly 4/5 with exception in R ankle which is 4-/5 (inversion present) LLE Assessment LLE Assessment: Exceptions to [facility] Strength Comments: grossly 4/5, L ankle 4-/5  Care Tool Care Tool Bed Mobility Roll left and right activity   Roll left and right assist level: Contact Guard/Touching  assist    Sit to lying activity   Sit to lying assist level: Contact Guard/Touching assist    Lying to sitting edge of bed activity   Lying to sitting edge of bed assist level: Minimal Assistance - Patient > 75%     Care Tool Transfers Sit to stand transfer   Sit to stand assist level: Contact Guard/Touching assist    Chair/bed transfer   Chair/bed transfer assist level: Contact Guard/Touching assist     Toilet transfer   Assist Level: Minimal Assistance - Patient > 75%    Car transfer   Car transfer assist level: Minimal Assistance - Patient > 75%      Care Tool Locomotion Ambulation   Assist level: Contact Guard/Touching assist Assistive device: Walker-rolling    Walk 10 feet activity  Assist level: Contact Guard/Touching assist Assistive device: Walker-rolling   Walk 50 feet with 2 turns activity   Assist level: Contact Guard/Touching assist Assistive device: Walker-rolling  Walk 150 feet activity   Assist level: Contact Guard/Touching assist Assistive device: Walker-rolling  Walk 10 feet on uneven surfaces activity   Assist level: Contact Guard/Touching assist Assistive device: Walker-rolling  Stairs   Assist level: Contact Guard/Touching assist Stairs assistive device: 2 hand rails Max number of stairs: 4  Walk up/down 1 step activity   Walk up/down 1 step (curb) assist level: Minimal Assistance - Patient > 75% Walk up/down 1 step or curb assistive device: Walker    Walk up/down 4 steps activity Walk up/down 4 steps assist level: Contact Guard/Touching assist Walk up/down 4 steps assistive device: 2 hand rails  Walk up/down 12 steps activity   Walk up/down 12 steps assist level: Minimal Assistance - Patient > 75% Walk up/down 12 steps assistive device: 2 hand rails  Pick up small objects from floor Pick up small object from the floor (from standing position) activity did not occur: Safety/medical concerns      Wheelchair Will patient use wheelchair at  discharge?: No     Wheelchair assist level: Independent    Wheel 50 feet with 2 turns activity      Wheel 150 feet activity        Refer to Care Plan for Long Term Goals  SHORT TERM GOAL WEEK 1 PT Short Term Goal 1 (Week 1): STGs = LTGs  Recommendations for other services: Therapeutic Recreation  Pet therapy, Kitchen group and Outing/community reintegration  Skilled Therapeutic Intervention Mobility Bed Mobility Bed Mobility: Rolling Left;Left Sidelying to Sit;Sitting - Scoot to [employer] of Bed Rolling Left: Contact Guard/Touching assist (pt able to roll using bed features with minimal cueing to bring R LE forward) Left Sidelying to Sit: Minimal Assistance - Patient >75% (pt requires min assist for cueing of hand placement for successful completion of task and assistance under L shoulder) Sitting - Scoot to Edge of Bed: Contact Guard/Touching assist Transfers Transfers: Sit to Stand;Stand to Sit;Stand Pivot Transfers Sit to Stand: Contact Guard/Touching assist;Supervision/Verbal cueing (supervision with elevated surface, lower surfaces CGA to prevent from breaking back precautions) Stand to Sit: Supervision/Verbal cueing Stand Pivot Transfers: Supervision/Verbal cueing;Contact Guard/Touching assist Stand Pivot Transfer Details: Verbal cues for precautions/safety;Verbal cues for safe use of DME/AE Transfer (Assistive device): Rolling walker Locomotion  Gait Ambulation: Yes Gait Assistance: Contact Guard/Touching assist Assistive device: Rolling walker Gait Gait: Yes Gait Pattern: Within Functional Limits Gait Pattern: Decreased step length - right;Decreased step length - left;Decreased dorsiflexion - right;Narrow base of support;Step-through pattern Gait velocity: slow, likely due to fear and sensory deficits Stairs / Additional Locomotion Stairs: Yes Stairs Assistance: Contact Guard/Touching assist Stair Management Technique: Two rails;Step to pattern Height of Stairs:  6 Ramp: Contact Guard/touching assist Wheelchair Mobility Wheelchair Mobility: No  Evaluation/Session 1: Pt received semi-reclined in bed with husband at bedside. Physician and nurse present at beginning of session to administer medication and follow up about concerns of the patient and husband. Pt does not complain of any pain, but states that her feet and L hip/buttock both feel "thick" and numb. She is able to recall her back precautions when prompted. Pt transitioned to L EOB via rolling onto L side with CGA for LE management and cueing and L sidelying to sit with min assist for cueing and assistance under L shoulder to come into erect posture. Sensation and gross strength screen  completed at bedside prior to performing stand pivot transfer with RW to [facility].   Pt able to stand from elevated bed with supervision while maintaining precautions into RW. Pt performs stand pivot transfer with CGA and minimal cueing to reach back for her arm rests. Pt transported in [facility] with total assist to main therapy gym for further assessment.  Pt ambulated ~14f with CGA and RW with 2 turns to the L. Pt's gait speed is quite slow, likely due to fear and sensory deficits in B feet. [facility].   Car transfer into raised car (pt has large SUV and a FDynegy with min assist for LPlains All American Pipelineand safety.   Pt ambulated up/down ramp with RW and CGA as well as through mulch with RW and CGA. Pt states that she feels slightly more unsteady in the mulch due to her lack of sensation in B feet. Pt transported to stairs in WC.   Stairs x4 ascending with LLE and descending with RLE as she feels that the L is stronger. Pt requiring CGA and use of 2 handrails. [facility]<>[facility].   5x sit<>stand: 45sec with B UE support from arm rests.   [facility] with chair belt on, call bell in reach, and needs met.   Throughout session, pt performed  sit<>stand x12 between activities with CGA/supervision, depending on seat height.    Discharge Criteria: Patient will be discharged from PT if patient refuses treatment 3 consecutive times without medical reason, if treatment goals not met, if there is a change in medical status, if patient makes no progress towards goals or if patient is discharged from hospital.  The above assessment, treatment plan, treatment alternatives and goals were discussed and mutually agreed upon: by patient and by family  BGaylord Shih11/13/2021, 10:32 AM

## 2019-12-08 NOTE — Progress Notes (Signed)
Physical Therapy Session Note  Patient Details  Name: Melanie Baldwin MRN: [id number] Date of Birth: 1985/12/16  Today's Date: 12/08/2019 PT Individual Time: 0175-1025 PT Individual Time Calculation (min): 44 min   Short Term Goals: Week 1:  PT Short Term Goal 1 (Week 1): STGs = LTGs  Skilled Therapeutic Interventions/Progress Updates:    Pt received supine in bed and agreeable to PT. Pt does not complain of pain at this time. Pt supine> L Sidelying, L sidelying > sit EOB, both CGA with verbal/tactile cueing for sequencing and use of bed features for assistance. Sit>Stand CGA with RW in front for stabilty, then stand pivot to WC via CGA. Pt transported to main therapy gym in [facility] total assist for energy conservation and time management.   Once in gym, pt instructed in how to perform Berg. See details below. Patient demonstrates increased fall risk as noted by score of 13/56 on Berg Balance Scale.  (<36= high risk for falls, close to 100%; 37-45 significant >80%; 46-51 moderate >50%; 52-55 lower >25%).  Pt instructed on how to perform hand held assisted gait and performs for ~84f. However, pt feels much more unsteady and demonstrates significantly decreased step length, 2 near LOB to the R (PT guarding/assisting on L), and difficulty engaging quads for TKE.   [facility] using RW and CGA. [facility] in WC with call bell in reach, chair belt on, and needs met.   Pt performed ~10 sit<>stands throughout session with min assist and without AD.   Therapy Documentation Precautions:  Precautions Precautions: Back Precaution Comments: per MD orders, no brace needed Restrictions Weight Bearing Restrictions: No Balance: Balance Balance Assessed: Yes Standardized Balance Assessment Standardized Balance Assessment: Berg Balance Test Berg Balance Test Sit to Stand: Able to stand using hands after several tries Standing Unsupported: Able to  stand 30 seconds unsupported Sitting with Back Unsupported but Feet Supported on Floor or Stool: Able to sit 2 minutes under supervision Stand to Sit: Uses backs of legs against chair to control descent Transfers: Able to transfer safely, definite need of hands Standing Unsupported with Eyes Closed: Needs help to keep from falling Standing Ubsupported with Feet Together: Needs help to attain position and unable to hold for 15 seconds From Standing, Reach Forward with Outstretched Arm: Loses balance while trying/requires external support (back precautions) From Standing Position, Pick up Object from Floor: Unable to try/needs assist to keep balance (back precautions) From Standing Position, Turn to Look Behind Over each Shoulder: Needs assist to keep from losing balance and falling (back precations) Turn 360 Degrees: Needs assistance while turning Standing Unsupported, Alternately Place Feet on Step/Stool: Able to complete >2 steps/needs minimal assist Standing Unsupported, One Foot in Front: Loses balance while stepping or standing Standing on One Leg: Unable to try or needs assist to prevent fall Total Score: 13 Dynamic Sitting Balance Dynamic Sitting - Balance Support: Feet supported;During functional activity;No upper extremity supported (donning footwear while seated EOB) Dynamic Sitting - Level of Assistance: 5: Stand by assistance Dynamic Standing Balance Dynamic Standing - Balance Support: No upper extremity supported;During functional activity Dynamic Standing - Level of Assistance: 4: Min assist (LB dressing tasks)   Therapy/Group: Individual Therapy  BGaylord Shih11/13/2021, 5:34 PM

## 2019-12-08 NOTE — Progress Notes (Signed)
[place] PHYSICAL MEDICINE & REHABILITATION PROGRESS NOTE   Subjective/Complaints:  Pt reports had bowel program overnight-  Had 2 BMs o/n per husband.  Has no sensation- doesn't feel bowels- still has foley- is working- per Dr Eliane Decree note, will keep for now.   Has a lot of itching- using benadryl prn- helps, but not all of it.  Will try eucerin BID for dry skin.   Feeling really lonely and crying a lot per husband- wondering if he can stay here overnight- I agreed- Nursing to get a chair that folds out, if possible.    ROS:  Pt denies SOB, abd pain, CP, N/V/C/D, and vision changes    Objective:   DG Abd Portable 1V  Result Date: 12/07/2019 CLINICAL DATA:  Constipation EXAM: PORTABLE ABDOMEN - 1 VIEW COMPARISON:  None. FINDINGS: Upper normal amount of stool in the colon may correlate with the patient's observe constipation. No dilated small bowel. No significant abnormal calcifications. Mild dextroconvex lumbar scoliosis may be positional. IMPRESSION: 1. Upper normal amount of stool in the colon may correlate with the patient's observe constipation. Electronically Signed   By: Gaylyn Rong M.D.   On: 12/07/2019 19:00   Recent Labs    12/08/19 0717  WBC 9.3  HGB 11.9*  HCT 37.9  PLT 186   Recent Labs    12/08/19 0717  NA 136  K 4.1  CL 101  CO2 24  GLUCOSE 109*  BUN 15  CREATININE 0.55  CALCIUM 9.0    Intake/Output Summary (Last 24 hours) at 12/08/2019 1300 Last data filed at 12/08/2019 1013 Gross per 24 hour  Intake 600 ml  Output 1825 ml  Net -1225 ml        Physical Exam: Vital Signs Blood pressure 114/74, pulse 82, temperature 98.1 F (36.7 C), temperature source Oral, resp. rate 18, height 5\' 4"  (1.626 m), weight 79.3 kg, last menstrual period 11/10/2019, SpO2 99 %, unknown if currently breastfeeding.   Constitutional:      General: She is not in acute distress. Pt sitting up in bed- OT in room, husband and RN at bedside, NAD  HENT:  conjugate gaze Cardiovascular: RRR; no JVD Pulmonary: CTA B/L- no W/R/R- good air movement Abdominal: Soft, NT, ND, (+)BS .  Musculoskeletal:     Cervical back: Normal range of motion and neck supple.     Comments: No edema or tenderness in extremities  Skin:    General: . Also appears a little dry.      Comments: Lower back incision with dry honeycomb dressing.   Neurological: Ox3    Comments: Alert Motor: Bilateral upper extremities: 5/5 proximal to distal B/l LE: HF, KE 4-4+/5, ADF 4+/5 Sensation diminished to light touch b/l LE  Psychiatric:   depressed affect- tearful at times   Assessment/Plan: 1. Functional deficits which require 3+ hours per day of interdisciplinary therapy in a comprehensive inpatient rehab setting.  Physiatrist is providing close team supervision and 24 hour management of active medical problems listed below.  Physiatrist and rehab team continue to assess barriers to discharge/monitor patient progress toward functional and medical goals  Care Tool:  Bathing              Bathing assist       Upper Body Dressing/Undressing Upper body dressing        Upper body assist      Lower Body Dressing/Undressing Lower body dressing            Lower  body assist       Toileting Toileting    Toileting assist       Transfers Chair/bed transfer  Transfers assist     Chair/bed transfer assist level: Contact Guard/Touching assist     Locomotion Ambulation   Ambulation assist      Assist level: Minimal Assistance - Patient > 75% Assistive device: Walker-rolling     Walk 10 feet activity   Assist     Assist level: Minimal Assistance - Patient > 75% Assistive device: Walker-rolling   Walk 50 feet activity   Assist    Assist level: Minimal Assistance - Patient > 75% Assistive device: Walker-rolling    Walk 150 feet activity   Assist    Assist level: Minimal Assistance - Patient > 75% Assistive device:  Walker-rolling    Walk 10 feet on uneven surface  activity   Assist     Assist level: Minimal Assistance - Patient > 75% Assistive device: Photographer Will patient use wheelchair at discharge?: No      Wheelchair assist level: Independent      Wheelchair 50 feet with 2 turns activity    Assist            Wheelchair 150 feet activity     Assist          Blood pressure 114/74, pulse 82, temperature 98.1 F (36.7 C), temperature source Oral, resp. rate 18, height 5\' 4"  (1.626 m), weight 79.3 kg, last menstrual period 11/10/2019, SpO2 99 %, unknown if currently breastfeeding.  Medical Problem List and Plan: 1.  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             -patient may shower with incision covered             -ELOS/Goals: 7-10 days/Supervision/Mod I             Admit to CIR 2.  Antithrombotics: -DVT/anticoagulation:  Mechanical: Sequential compression devices, below knee Bilateral lower extremities 11/13- find out when can do Lovenox- if not walking, needs 3 months; if walks, needs 2 months             -antiplatelet therapy: N/A 3. Pain Management: Mobic twice daily with hydrocodone as needed.             Monitor with increased exertion for post-operative and neuropathic pain 4. Mood: LCSW to follow for evaluation and support  11/13- tearful- let husband stay at night- might need SSRI             -antipsychotic agents: N/A 5. Neuropsych: This patient is capable of making decisions on her own behalf. 6. Skin/Wound Care: Monitor wound for healing.  Routine pressure-relief measures.  11/13- skin dry- itching- con't benadryl and add eucerin BID 7. Fluids/Electrolytes/Nutrition: Monitor I/Os.               CMP ordered 8.  Fasting hyperglycemia: Likely due to prior steroids and/or IV dextrose--question impaired fasting glucose?                Blood glucose ordered 9.  Neurogenic bowel: Reports that she has not had BM for days prior to admission 11/09. KUB ordered to check stool burden and decide on fleets v/s SSE. Dose of Miralax post admission. Changed colace to Senna-S 2 tabs in am followed by suppository at nights.    11/13- had 2 BMs last night- con't  bowel program 10. Neurogenic bladder: Continue foely--d/c in am pending + results with laxatives  11/13- was told by nursing, don't remove foley as of yet- will suggest removal by Monday if no concern by Urology about keeping.      LOS: 1 days A FACE TO FACE EVALUATION WAS PERFORMED  Benelli Winther 12/08/2019, 1:00 PM

## 2019-12-08 NOTE — Plan of Care (Signed)
  Problem: RH Balance Goal: LTG Patient will maintain dynamic standing with ADLs (OT) Description: LTG:  Patient will maintain dynamic standing balance with assist during activities of daily living (OT)  Flowsheets (Taken 12/08/2019 1600) LTG: Pt will maintain dynamic standing balance during ADLs with: Independent with assistive device   Problem: Sit to Stand Goal: LTG:  Patient will perform sit to stand in prep for activites of daily living with assistance level (OT) Description: LTG:  Patient will perform sit to stand in prep for activites of daily living with assistance level (OT) Flowsheets (Taken 12/08/2019 1600) LTG: PT will perform sit to stand in prep for activites of daily living with assistance level: Independent with assistive device   Problem: RH Grooming Goal: LTG Patient will perform grooming w/assist,cues/equip (OT) Description: LTG: Patient will perform grooming with assist, with/without cues using equipment (OT) Flowsheets (Taken 12/08/2019 1600) LTG: Pt will perform grooming with assistance level of: Independent with assistive device    Problem: RH Bathing Goal: LTG Patient will bathe all body parts with assist levels (OT) Description: LTG: Patient will bathe all body parts with assist levels (OT) Flowsheets (Taken 12/08/2019 1600) LTG: Pt will perform bathing with assistance level/cueing: Set up assist    Problem: RH Dressing Goal: LTG Patient will perform upper body dressing (OT) Description: LTG Patient will perform upper body dressing with assist, with/without cues (OT). Flowsheets (Taken 12/08/2019 1600) LTG: Pt will perform upper body dressing with assistance level of: Independent with assistive device Goal: LTG Patient will perform lower body dressing w/assist (OT) Description: LTG: Patient will perform lower body dressing with assist, with/without cues in positioning using equipment (OT) Flowsheets (Taken 12/08/2019 1600) LTG: Pt will perform lower body  dressing with assistance level of: Independent with assistive device   Problem: RH Toileting Goal: LTG Patient will perform toileting task (3/3 steps) with assistance level (OT) Description: LTG: Patient will perform toileting task (3/3 steps) with assistance level (OT)  Flowsheets (Taken 12/08/2019 1600) LTG: Pt will perform toileting task (3/3 steps) with assistance level: Independent with assistive device   Problem: RH Toilet Transfers Goal: LTG Patient will perform toilet transfers w/assist (OT) Description: LTG: Patient will perform toilet transfers with assist, with/without cues using equipment (OT) Flowsheets (Taken 12/08/2019 1600) LTG: Pt will perform toilet transfers with assistance level of: Independent with assistive device   Problem: RH Tub/Shower Transfers Goal: LTG Patient will perform tub/shower transfers w/assist (OT) Description: LTG: Patient will perform tub/shower transfers with assist, with/without cues using equipment (OT) Flowsheets (Taken 12/08/2019 1600) LTG: Pt will perform tub/shower stall transfers with assistance level of: Set up assist

## 2019-12-09 DIAGNOSIS — S343XXD Injury of cauda equina, subsequent encounter: Secondary | ICD-10-CM | POA: Diagnosis not present

## 2019-12-09 MED ORDER — TAMSULOSIN HCL 0.4 MG PO CAPS
0.4000 mg | ORAL_CAPSULE | Freq: Every day | ORAL | Status: DC
  Administered 2019-12-09 – 2019-12-10 (×2): 0.4 mg via ORAL
  Filled 2019-12-09 (×2): qty 1

## 2019-12-09 NOTE — Progress Notes (Signed)
[place] PHYSICAL MEDICINE & REHABILITATION PROGRESS NOTE   Subjective/Complaints:   Pt reports BM with suppository last night- Legs felt so heavy when sleep/not comofortable- husband spent night- will be back later.  Has a 32 yr old and 10 mos old kids.   Will con't foley for now- but will start flomax to see if can help her void.    ROS:  Pt denies SOB, abd pain, CP, N/V/C/D, and vision changes   Objective:   DG Abd Portable 1V  Result Date: 12/07/2019 CLINICAL DATA:  Constipation EXAM: PORTABLE ABDOMEN - 1 VIEW COMPARISON:  None. FINDINGS: Upper normal amount of stool in the colon may correlate with the patient's observe constipation. No dilated small bowel. No significant abnormal calcifications. Mild dextroconvex lumbar scoliosis may be positional. IMPRESSION: 1. Upper normal amount of stool in the colon may correlate with the patient's observe constipation. Electronically Signed   By: Gaylyn Rong M.D.   On: 12/07/2019 19:00   Recent Labs    12/08/19 0717  WBC 9.3  HGB 11.9*  HCT 37.9  PLT 186   Recent Labs    12/08/19 0717  NA 136  K 4.1  CL 101  CO2 24  GLUCOSE 109*  BUN 15  CREATININE 0.55  CALCIUM 9.0    Intake/Output Summary (Last 24 hours) at 12/09/2019 1433 Last data filed at 12/09/2019 1256 Gross per 24 hour  Intake 462 ml  Output 1350 ml  Net -888 ml        Physical Exam: Vital Signs Blood pressure 106/63, pulse 99, temperature 98.1 F (36.7 C), resp. rate 16, height 5\' 4"  (1.626 m), weight 79.3 kg, last menstrual period 11/10/2019, SpO2 95 %, unknown if currently breastfeeding.   Constitutional:      General: pt sitting up in bedside chair, sad affect, NAD HENT: conjugate gaze Cardiovascular: RRR_ no JVD Pulmonary: CTA B/L- no W/R/R- good air movement Abdominal: Soft, NT, ND, (+)BS .  Musculoskeletal:     Cervical back: Normal range of motion and neck supple.     Comments: No edema or tenderness in extremities  Skin:     General: . Also appears a little dry.      Comments: Lower back incision with dry honeycomb dressing.   Neurological: Ox3    Comments: Alert Motor: Bilateral upper extremities: 5/5 proximal to distal B/l LE: HF 4+/5 B/L- maybe 5-/5, KE 4-4+/5, DF/PF 4+/5 Sensation diminished to light touch b/l LE  from L3 to S2- is not affect at L1/L2 per pt.  Psychiatric:   depressed affect- tearful at times- no change   Assessment/Plan: 1. Functional deficits which require 3+ hours per day of interdisciplinary therapy in a comprehensive inpatient rehab setting.  Physiatrist is providing close team supervision and 24 hour management of active medical problems listed below.  Physiatrist and rehab team continue to assess barriers to discharge/monitor patient progress toward functional and medical goals  Care Tool:  Bathing    Body parts bathed by patient: Right arm, Left arm, Chest   Body parts bathed by helper: Buttocks     Bathing assist Assist Level: Minimal Assistance - Patient > 75%     Upper Body Dressing/Undressing Upper body dressing   What is the patient wearing?: Hospital gown only    Upper body assist Assist Level: Set up assist    Lower Body Dressing/Undressing Lower body dressing      What is the patient wearing?: Incontinence brief     Lower body assist  Assist for lower body dressing: Moderate Assistance - Patient 50 - 74%     Toileting Toileting Toileting Activity did not occur (Clothing management and hygiene only): N/A (no void or bm)  Toileting assist Assist for toileting: Moderate Assistance - Patient 50 - 74%     Transfers Chair/bed transfer  Transfers assist     Chair/bed transfer assist level: Minimal Assistance - Patient > 75%     Locomotion Ambulation   Ambulation assist      Assist level: Minimal Assistance - Patient > 75% Assistive device: Walker-rolling     Walk 10 feet activity   Assist     Assist level: Minimal Assistance -  Patient > 75% Assistive device: Walker-rolling   Walk 50 feet activity   Assist    Assist level: Minimal Assistance - Patient > 75% Assistive device: Walker-rolling    Walk 150 feet activity   Assist    Assist level: Minimal Assistance - Patient > 75% Assistive device: Walker-rolling    Walk 10 feet on uneven surface  activity   Assist     Assist level: Minimal Assistance - Patient > 75% Assistive device: Photographer Will patient use wheelchair at discharge?: No      Wheelchair assist level: Independent      Wheelchair 50 feet with 2 turns activity    Assist            Wheelchair 150 feet activity     Assist          Blood pressure 106/63, pulse 99, temperature 98.1 F (36.7 C), resp. rate 16, height 5\' 4"  (1.626 m), weight 79.3 kg, last menstrual period 11/10/2019, SpO2 95 %, unknown if currently breastfeeding.  Medical Problem List and Plan: 1.  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             -patient may shower with incision covered             -ELOS/Goals: 7-10 days/Supervision/Mod I             Admit to CIR  11/14- we had a frank discussion about  Could get return, but might not- will need to plan for it NOT coming back but hope for best.  2.  Antithrombotics: -DVT/anticoagulation:  Mechanical: Sequential compression devices, below knee Bilateral lower extremities 11/13- find out when can do Lovenox- if not walking, needs 3 months; if walks, needs 2 months             -antiplatelet therapy: N/A 3. Pain Management: Mobic twice daily with hydrocodone as needed.             Monitor with increased exertion for post-operative and neuropathic pain 4. Mood: LCSW to follow for evaluation and support  11/13- tearful- let husband stay at night- might need SSRI  11/14- if still tearful, can discuss SSRI             -antipsychotic  agents: N/A 5. Neuropsych: This patient is capable of making decisions on her own behalf. 6. Skin/Wound Care: Monitor wound for healing.  Routine pressure-relief measures.  11/13- skin dry- itching- con't benadryl and add eucerin BID 7. Fluids/Electrolytes/Nutrition: Monitor I/Os.               CMP ordered 8.  Fasting hyperglycemia: Likely due to prior steroids and/or IV dextrose--question impaired fasting glucose?  Blood glucose ordered 9.  Neurogenic bowel: Reports that she has not had BM for days prior to admission 11/09. KUB ordered to check stool burden and decide on fleets v/s SSE. Dose of Miralax post admission. Changed colace to Senna-S 2 tabs in am followed by suppository at nights.    11/13- had 2 BMs last night- con't bowel program  11/14- no documentation of bowel program last night- said got suppository- not clear 10. Neurogenic bladder: Continue foely--d/c in am pending + results with laxatives  11/13- was told by nursing, don't remove foley as of yet- will suggest removal by Monday if no concern by Urology about keeping.   11/14- will start Floamx 0.4 mg nightly to see if can help pt void. Will also remove foley in AM/Monday AM- and cath as needed. 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     LOS: 2 days A FACE TO FACE EVALUATION WAS PERFORMED  Colden Samaras 12/09/2019, 2:33 PM

## 2019-12-10 ENCOUNTER — Inpatient Hospital Stay (HOSPITAL_COMMUNITY): Payer: Medicaid Other | Admitting: Occupational Therapy

## 2019-12-10 ENCOUNTER — Inpatient Hospital Stay (HOSPITAL_COMMUNITY): Payer: Medicaid Other

## 2019-12-10 DIAGNOSIS — D62 Acute posthemorrhagic anemia: Secondary | ICD-10-CM

## 2019-12-10 DIAGNOSIS — K592 Neurogenic bowel, not elsewhere classified: Secondary | ICD-10-CM

## 2019-12-10 DIAGNOSIS — R739 Hyperglycemia, unspecified: Secondary | ICD-10-CM

## 2019-12-10 DIAGNOSIS — N319 Neuromuscular dysfunction of bladder, unspecified: Secondary | ICD-10-CM

## 2019-12-10 LAB — BASIC METABOLIC PANEL
Anion gap: 10 (ref 5–15)
BUN: 17 mg/dL (ref 6–20)
CO2: 22 mmol/L (ref 22–32)
Calcium: 8.9 mg/dL (ref 8.9–10.3)
Chloride: 105 mmol/L (ref 98–111)
Creatinine, Ser: 0.57 mg/dL (ref 0.44–1.00)
GFR, Estimated: >60 mL/min (ref >60)
Glucose, Bld: 111 mg/dL — ABNORMAL HIGH (ref 70–99)
Potassium: 4.3 mmol/L (ref 3.5–5.1)
Sodium: 137 mmol/L (ref 135–145)

## 2019-12-10 LAB — CBC
HCT: 37.3 % (ref 36.0–46.0)
Hemoglobin: 11.8 g/dL — ABNORMAL LOW (ref 12.0–15.0)
MCH: 27.6 pg (ref 26.0–34.0)
MCHC: 31.6 g/dL (ref 30.0–36.0)
MCV: 87.1 fL (ref 80.0–100.0)
Platelets: 206 10*3/uL (ref 150–400)
RBC: 4.28 MIL/uL (ref 3.87–5.11)
RDW: 15 % (ref 11.5–15.5)
WBC: 9.1 10*3/uL (ref 4.0–10.5)
nRBC: 0 % (ref 0.0–0.2)

## 2019-12-10 MED ORDER — SENNOSIDES-DOCUSATE SODIUM 8.6-50 MG PO TABS
2.0000 | ORAL_TABLET | Freq: Every day | ORAL | Status: DC
  Administered 2019-12-11 – 2019-12-18 (×8): 2 via ORAL
  Filled 2019-12-10 (×8): qty 2

## 2019-12-10 NOTE — Progress Notes (Signed)
Physical Therapy Session Note  Patient Details  Name: Melanie Baldwin MRN: [id number] Date of Birth: Jun 10, 1985  Today's Date: 12/10/2019 PT Individual Time: 1001-1056 PT Individual Time Calculation (min): 55 min   Short Term Goals: Week 1:  PT Short Term Goal 1 (Week 1): STGs = LTGs  Skilled Therapeutic Interventions/Progress Updates:   Received pt supine in bed, pt agreeable to therapy, and reported feeling dizzy and nauseous since this morning. RN made aware and pt premedicated. Pt's husband entered during session. Session with emphasis on functional mobility/transfers, generalized strengthening, dynamic standing balance/coordination, ambulation, and improved activity tolerance. BP in supine 105/71 and HR 91bpm. RN present to remove catheter and discussed pt switching to SCI team with new MD and team of therapists as pt requesting Dr. Berline Chough. Pt transferred supine<>sitting EOB with supervision from flat bed and transferred bed<>WC stand<>pivot without AD and min A with BUE support on therapist's shoulders. Pt transported to dayroom in [facility] total A for energy conservation purposes and transferred stand<>pivot WC<>Nustep with RW and CGA. Pt reported decreased symptoms of dizziness as session continued and performed BUE/LE strengthening on Nustep at workload 6 for 10 minutes for a total of 654 steps for improved cardiovascular endurance. Pt reported pins and needles sensation along RLE and therapist placed towel roll in between pt's foot and footplate and pt reported relief in symptoms. Pt required rest breaks throughout session due to fatigue. Pt ambulated 76ft with RW and CGA. Pt demonstrated narrow BOS, decreased stride length, and decreased trunk rotation with gait but no LOB noted. Worked on dynamic standing balance performing alternating toe taps to 3in step 2x20 with min handheld assist. Pt transferred sit<>stand without UE support 2x8 reps with min A fading to CGA. Pt reported increased low back  pain with activity that resolved in <30 seconds with rest. Pt transported back to room in [facility] total A and requested to return to bed stating her head felt "heavy" and legs felt "thick." Pt ambulated 42ft with RW and CGA to bed and transferred sit<>supine with supervision. Concluded session with pt supine in bed, needs within reach, and bed alarm on. Therapist provided pt with cool washcloth and fresh ice water. Husband present at bedside.   Therapy Documentation Precautions:  Precautions Precautions: Back Precaution Comments: per MD orders, no brace needed Restrictions Weight Bearing Restrictions: No   Therapy/Group: Individual Therapy Martin Majestic PT, DPT   12/10/2019, 7:27 AM

## 2019-12-10 NOTE — Progress Notes (Signed)
Occupational Therapy Session Note  Patient Details  Name: Melanie Baldwin MRN: [id number] Date of Birth: 05/17/1985  Today's Date: 12/10/2019 OT Individual Time: 1407-1501 OT Individual Time Calculation (min): 54 min    Short Term Goals: Week 1:  OT Short Term Goal 1 (Week 1): STGs=LTGs due to ELOS  Skilled Therapeutic Interventions/Progress Updates:    Treatment session with focus on functional transfers, functional mobility, and dynamic standing balance.  Pt received in sidelying reporting no pain and decreased dizziness from this AM.  Pt reports no void since foley removal and agreeable to attempt toileting. Pt completed bed mobility with supervision.  Sit > stand and ambulatory transfer to bathroom with RW with min assist.  Pt completed toilet transfer with min assist with RW, able to doff pants with min assist for standing balance. Noted pt to have small BM in incontinence brief.  Pt attempted to void bladder and bowels while on toilet with no success.  Therapist completed hygiene with pt able to pull pants back over hips with min assist for standing balance.  Engaged in dynamic standing balance and sit <> stand in therapy gym with focus on increased anterior weight shift during transitional movements and even when standing due to posterior bias in standing.  Engaged in Connect 4 and cup stacking activity, incorporating reaching outside BOS to facilitate weight shift.  Pt able to complete initially alternating UE support progressing to able to maintain standing balance without UE support for 30-45 seconds at a time with CGA from therapist.  Pt returned to room and ambulated 15' back to bed with RW with min assist.  Pt transitioned to sidelying with supervision and left with all needs in reach.  Therapy Documentation Precautions:  Precautions Precautions: Back Precaution Comments: per MD orders, no brace needed Restrictions Weight Bearing Restrictions: No General:   Vital Signs: Therapy  Vitals Temp: 98.8 F (37.1 C) Pulse Rate: 97 Resp: 18 BP: 97/66 Patient Position (if appropriate): Lying Oxygen Therapy SpO2: 98 % O2 Device: Room Air Pain: Pain Assessment Pain Score: 0-No pain   Therapy/Group: Individual Therapy  Rosalio Loud 12/10/2019, 3:27 PM

## 2019-12-10 NOTE — Progress Notes (Signed)
Occupational Therapy Session Note  Patient Details  Name: Melanie Baldwin MRN: [id number] Date of Birth: 06/07/1985  Today's Date: 12/10/2019 OT Individual Time: 9629-5284 OT Individual Time Calculation (min): 45 min    Short Term Goals: Week 1:  OT Short Term Goal 1 (Week 1): STGs=LTGs due to ELOS  Skilled Therapeutic Interventions/Progress Updates:    Treatment session with focus on self-care retraining, dynamic standing balance, and functional transfers.  Pt received in sidelying in bed with no c/o pain but reporting mild dizziness.  Pt reports having received something for dizziness.  Pt completed bed mobility with CGA to come to sitting at EOB.  Stand pivot transfer without AD with min assist, pt utilizing therapist to stabilize self when upright.  Engaged in bathing and dressing at sit > stand level at sink.  Pt able to complete UB bathing and dressing at seated level with setup.  Pt completed sit > stand at sink with UE support on sink with min assist.  Pt able to maintain static standing with CGA to close supervision, up to min assist for dynamic standing balance.  Pt assisted with clothing management to pull pants over hips after therapist completed hygiene (BM smear) and donned clean incontinence brief.  Pt reports diminished sensation in B feet and sacrum. Pt reports nausea and dizziness, requesting to return to bed.  Completed stand pivot transfer back to bed min assist and left in sidelying.  RN notified of c/o nausea and dizziness.  Therapy Documentation Precautions:  Precautions Precautions: Back Precaution Comments: per MD orders, no brace needed Restrictions Weight Bearing Restrictions: No Pain: Pain Assessment Pain Scale: 0-10 Pain Score: 0-No pain   Therapy/Group: Individual Therapy  Rosalio Loud 12/10/2019, 10:13 AM

## 2019-12-10 NOTE — Progress Notes (Signed)
Occupational Therapy Session Note  Patient Details  Name: Melanie Baldwin MRN: [id number] Date of Birth: Nov 12, 1985  Today's Date: 12/10/2019 OT Individual Time: 1130-1200 OT Individual Time Calculation (min): 30 min    Short Term Goals: Week 1:  OT Short Term Goal 1 (Week 1): STGs=LTGs due to ELOS  Skilled Therapeutic Interventions/Progress Updates:    Pt seen this session to focus on LB strength and coordination with balance and mobility. Pt's spouse present.  Pt received in bed and was able to sit to EOB without A.  From EOB, worked on AROM of each leg with isometric quad holds.  She has back precautions, but with her sit to stands she does not like to flex forward at all and tends to stand up leading with her hips.  Had pt transfer to arm chair.  Worked on pushing up with B hands for sit to partial stand/ squat maintaining back precautions.  Then standing with RW to partial sit only a few degrees. Worked on pushing her hips back slightly to avoid flexing knees past toes to avoid risk of legs buckling.   In standing, weight shifting side to side, alternating stepping forward and back and alternating heel raises (pt unable to lift B heels at the same time) with heavy reliance on RW and min A for support.   To get back into bed, min A to lift L.  Pt in bed with all needs met and bed alarm set.   No dizziness this session.  Therapy Documentation Precautions:  Precautions Precautions: Back Precaution Comments: per MD orders, no brace needed Restrictions Weight Bearing Restrictions: No   Pain: Pain Assessment Pain Scale: 0-10 Pain Score: 0-No pain ADL: ADL Eating: Not assessed Grooming: Contact guard Where Assessed-Grooming: Standing at sink Upper Body Bathing: Setup Where Assessed-Upper Body Bathing: Edge of bed Lower Body Bathing: Minimal assistance Where Assessed-Lower Body Bathing: Edge of bed Upper Body Dressing: Setup Where Assessed-Upper Body Dressing: Edge of  bed Lower Body Dressing: Minimal assistance Where Assessed-Lower Body Dressing: Edge of bed Toileting: Not assessed Toilet Transfer: Minimal assistance Toilet Transfer Method: Ambulating (RW) Science writer: Emergency planning/management officer Transfer: Minimal assistance Social research officer, government Method: Ambulating (RW)  Therapy/Group: Individual Therapy  Register 12/10/2019, 8:51 AM

## 2019-12-10 NOTE — Progress Notes (Signed)
Patient Details  Name: Melanie Baldwin MRN: [id number] Date of Birth: [birth year]  Today's Date: 12/10/2019  Hospital Problems: Principal Problem:   Cauda equina spinal cord injury [facility])  Past Medical History:  Past Medical History:  Diagnosis Date  . Cystic fibrosis carrier   . Normal pregnancy 09/24/2010  . TB (pulmonary tuberculosis)    took med for 6 months currently negative   Past Surgical History:  Past Surgical History:  Procedure Laterality Date  . LUMBAR LAMINECTOMY/DECOMPRESSION MICRODISCECTOMY Bilateral 12/04/2019   Procedure: Bilateral Lumbar Four-Five Microdiscectomy;  Surgeon: Maeola Harman, MD;  Location: [facility] OR;  Service: Neurosurgery;  Laterality: Bilateral;   Social History:  reports that she has never smoked. She has never used smokeless tobacco. She reports that she does not drink alcohol and does not use drugs.  Family / Support Systems Marital Status: Married Patient Roles: Spouse, Parent Spouse/Significant Other: David Stall [phone] Children: 74 yo and 82 month old daughter's Other Supports: Friends Anticipated Caregiver: Husband Ability/Limitations of Caregiver: Husband trying to take FMLA and getting paperwork for MD to complete Caregiver Availability: Other (Comment) (If husband can get FMLA) Family Dynamics: Close with family but mainly she and husband since other family members are out of town. Husband has been managing the girls with wife here  Social History Preferred language: English Religion: None Cultural Background: [place] culture been here for years Education: Some college Read: Yes Write: Yes Employment Status: Unemployed Marine scientist Issues: No issues Guardian/Conservator: None-according to MD pt is capable of making her own decisions while here. Husband is here daily and is providing support   Abuse/Neglect Abuse/Neglect Assessment Can Be Completed: Yes Physical Abuse: Denies Verbal Abuse: Denies Sexual Abuse:  Denies Exploitation of patient/patient's resources: Denies Self-Neglect: Denies  Emotional Status Pt's affect, behavior and adjustment status: Pt is motivated to improve and recover from this. She has not been doing well for the past two months and slowly declining in her function. She is relieved she knows what the cause is now and has had surgery, but is still concnerned Recent Psychosocial Issues: recent birth of their daughter-6 months Psychiatric History: No history deferred depression screen due to all that has happened to pt ina short time do feel she would benefit from seeing neuro-psych while here Substance Abuse History: No issues  Patient / Family Perceptions, Expectations & Goals Pt/Family understanding of illness & functional limitations: Pt and husband can explain her surgery and talk with the MD's daily and feel they have a better understanding of what is happening to her now. She is pleased with the therapies and medical follow on rehab. Premorbid pt/family roles/activities: Wife, mother, friend, etc Anticipated changes in roles/activities/participation: resume Pt/family expectations/goals: Pt states: " I want to be able to take care of myself and my daughter's."  Husband states: " I am glad she is doing better and progressing."  [employer]: None Premorbid Home Care/DME Agencies: None Transportation available at discharge: Husband Resource referrals recommended: Neuropsychology  Discharge Planning Living Arrangements: Spouse/significant other, Children Support Systems: Spouse/significant other, Children, Friends/neighbors Type of Residence: Private residence Community education officer Resources: [employer] (specify county) Surveyor, quantity Resources: Family Support Financial Screen Referred: Previously completed Living Expenses: Psychologist, sport and exercise Management: Spouse Does the patient have any problems obtaining your medications?: No Home Management: Patient until she could  not due to loss of function Patient/Family Preliminary Plans: Return home with husband who is trying to take a FMLA to provide assist to her at discharge. Husband to get paperwork from employer  and get to MD to complete. Aware est length of stay is 7-10 days. Care Coordinator Barriers to Discharge: Decreased caregiver support Care Coordinator Barriers to Discharge Comments: Husband may be able to get FMLA or not, will wait and see Care Coordinator Anticipated Follow Up Needs: HH/OP  Clinical Impression Motivated female who is willing to work and push herself in therapies. Her husband is supportive and willing to assist. Neuro-psych to see while here and will assist with FMLA paperwork. Work toward safe discharge plan.  Lucy Chris 12/10/2019, 11:32 AM

## 2019-12-10 NOTE — Care Management (Signed)
Inpatient Rehabilitation Center Individual Statement of Services  Patient Name:  Melanie Baldwin  Date:  12/10/2019  Welcome to the Inpatient Rehabilitation Center.  Our goal is to provide you with an individualized program based on your diagnosis and situation, designed to meet your specific needs.  With this comprehensive rehabilitation program, you will be expected to participate in at least 3 hours of rehabilitation therapies Monday-Friday, with modified therapy programming on the weekends.  Your rehabilitation program will include the following services:  Physical Therapy (PT), Occupational Therapy (OT), 24 hour per day rehabilitation nursing, Therapeutic Recreaction (TR), Psychology, Neuropsychology, Care Coordinator, Rehabilitation Medicine, Nutrition Services, Pharmacy Services and Other  Weekly team conferences will be held on Tuesdays to discuss your progress.  Your Inpatient Rehabilitation Care Coordinator will talk with you frequently to get your input and to update you on team discussions.  Team conferences with you and your family in attendance may also be held.  Expected length of stay: 7-10 days  Overall anticipated outcome: Independent with an Assistive Device  Depending on your progress and recovery, your program may change. Your Inpatient Rehabilitation Care Coordinator will coordinate services and will keep you informed of any changes. Your Inpatient Rehabilitation Care Coordinator's name and contact numbers are listed  below.  The following services may also be recommended but are not provided by the Inpatient Rehabilitation Center:   Driving Evaluations  Home Health Rehabiltiation Services  Outpatient Rehabilitation Services  Vocational Rehabilitation   Arrangements will be made to provide these services after discharge if needed.  Arrangements include referral to agencies that provide these services.  Your insurance has been verified to be:  [place] Healthy  Blue  Your primary doctor is:  No PCP  Pertinent information will be shared with your doctor and your insurance company.  Inpatient Rehabilitation Care Coordinator:  Susie Cassette [phone] or ([phone]  Information discussed with and copy given to patient by: Gretchen Short, 12/10/2019, 6:26 AM

## 2019-12-10 NOTE — Progress Notes (Signed)
Pt had a medium sized BM.

## 2019-12-10 NOTE — Progress Notes (Signed)
Inpatient Rehabilitation  Patient information reviewed and entered into eRehab system by Pearl Berlinger M. Cielo Arias, M.A., CCC/SLP, PPS Coordinator.  Information including medical coding, functional ability and quality indicators will be reviewed and updated through discharge.    

## 2019-12-10 NOTE — Progress Notes (Signed)
Foley removed per order. Patient educated about voiding trial, I/O caths, infection prevention and verbalized understanding.

## 2019-12-10 NOTE — Progress Notes (Signed)
[facility] PHYSICAL MEDICINE & REHABILITATION PROGRESS NOTE   Subjective/Complaints: Patient seen laying in bed this morning.  She states she slept fairly overnight due to abdominal pain, which resolved this morning.  ROS: Denies CP, SOB, N/V/D  Objective:   No results found. Recent Labs    12/08/19 0717 12/10/19 0622  WBC 9.3 9.1  HGB 11.9* 11.8*  HCT 37.9 37.3  PLT 186 206   Recent Labs    12/08/19 0717 12/10/19 0622  NA 136 137  K 4.1 4.3  CL 101 105  CO2 24 22  GLUCOSE 109* 111*  BUN 15 17  CREATININE 0.55 0.57  CALCIUM 9.0 8.9    Intake/Output Summary (Last 24 hours) at 12/10/2019 1703 Last data filed at 12/10/2019 1633 Gross per 24 hour  Intake 720 ml  Output 2080 ml  Net -1360 ml        Physical Exam: Vital Signs Blood pressure 97/66, pulse 97, temperature 98.8 F (37.1 C), resp. rate 18, height 5\' 4"  (1.626 m), weight 79.3 kg, last menstrual period 11/10/2019, SpO2 98 %, unknown if currently breastfeeding. Constitutional: No distress . Vital signs reviewed. HENT: Normocephalic.  Atraumatic. Eyes: EOMI. No discharge. Cardiovascular: No JVD.  RRR. Respiratory: Normal effort.  No stridor.  Bilateral clear to auscultation. GI: Non-distended.  BS +. Skin: Warm and dry.  Incision with dressing CDI Psych: Normal mood.  Normal behavior. Musc: No edema in extremities.  No tenderness in extremities. Neuro: Alert Motor: Bilateral upper extremities: 5/5 proximal to distal B/l LE: HF 4+/5, KE 4-4+/5, DF/PF 4+/5 Sensation diminished to light touch b/l LE    Assessment/Plan: 1. Functional deficits which require 3+ hours per day of interdisciplinary therapy in a comprehensive inpatient rehab setting.  Physiatrist is providing close team supervision and 24 hour management of active medical problems listed below.  Physiatrist and rehab team continue to assess barriers to discharge/monitor patient progress toward functional and medical goals  Care  Tool:  Bathing    Body parts bathed by patient: Right arm, Left arm, Chest, Abdomen   Body parts bathed by helper: Front perineal area, Buttocks     Bathing assist Assist Level: Minimal Assistance - Patient > 75%     Upper Body Dressing/Undressing Upper body dressing   What is the patient wearing?: Pull over shirt    Upper body assist Assist Level: Set up assist    Lower Body Dressing/Undressing Lower body dressing      What is the patient wearing?: Incontinence brief, Pants     Lower body assist Assist for lower body dressing: Maximal Assistance - Patient 25 - 49%     Toileting Toileting Toileting Activity did not occur (Clothing management and hygiene only): N/A (no void or bm)  Toileting assist Assist for toileting: Minimal Assistance - Patient > 75%     Transfers Chair/bed transfer  Transfers assist     Chair/bed transfer assist level: Minimal Assistance - Patient > 75%     Locomotion Ambulation   Ambulation assist      Assist level: Contact Guard/Touching assist Assistive device: Walker-rolling Max distance: 74ft   Walk 10 feet activity   Assist     Assist level: Contact Guard/Touching assist Assistive device: Walker-rolling   Walk 50 feet activity   Assist    Assist level: Contact Guard/Touching assist Assistive device: Walker-rolling    Walk 150 feet activity   Assist    Assist level: Minimal Assistance - Patient > 75% Assistive device: Walker-rolling  Walk 10 feet on uneven surface  activity   Assist     Assist level: Minimal Assistance - Patient > 75% Assistive device: Photographer Will patient use wheelchair at discharge?: No      Wheelchair assist level: Independent      Wheelchair 50 feet with 2 turns activity    Assist            Wheelchair 150 feet activity     Assist          Blood pressure 97/66, pulse 97, temperature 98.8 F (37.1 C), resp. rate  18, height 5\' 4"  (1.626 m), weight 79.3 kg, last menstrual period 11/10/2019, SpO2 98 %, unknown if currently breastfeeding.  Medical Problem List and Plan: 1.  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  Continue CIR 2.  Antithrombotics: -DVT/anticoagulation:  Mechanical: Sequential compression devices, below knee Bilateral lower extremities  Lovenox- if not walking, needs 3 months; if walks, needs 2 months             -antiplatelet therapy: N/A 3. Pain Management: Mobic twice daily with hydrocodone as needed.             Monitor with increased exertion for post-operative and neuropathic pain  Appears to be controlled on 11/15 4. Mood: LCSW to follow for evaluation and support             -antipsychotic agents: N/A 5. Neuropsych: This patient is capable of making decisions on her own behalf. 6. Skin/Wound Care: Monitor wound for healing.  Routine pressure-relief measures.  Itching- con't benadryl and add eucerin BID 7. Fluids/Electrolytes/Nutrition: Monitor I/Os.               BMP within acceptable range on 11/13 8.  Fasting hyperglycemia: Likely due to prior steroids and/or IV dextrose--question impaired fasting glucose?               Blood glucose mildly elevated on 11/15 9.  Neurogenic bowel: Reports that she has not had BM for days prior to admission 11/09.   Senna-S 2 tabs changed to daily with suppository at nights.    Multiple bowel movements  10. Neurogenic bladder:   Started Floamx 0.4 mg nightly to see if can help pt void.   Foley DC'd on 11/15 11.  Acute blood loss anemia  Hemoglobin 11.8 on 11/15  Continue to monitor   LOS: 3 days A FACE TO FACE EVALUATION WAS PERFORMED  Taeshawn Helfman 12/15 12/10/2019, 5:03 PM

## 2019-12-10 NOTE — IPOC Note (Signed)
Overall Plan of Care [facility]) Patient Details Name: Melanie Baldwin MRN: [id number] DOB: January 14, 1986  Admitting Diagnosis: Cauda equina spinal cord injury [facility])  Hospital Problems: Principal Problem:   Cauda equina spinal cord injury [facility])     Functional Problem List: Nursing Edema, Endurance, Medication Management, Pain, Safety, Skin Integrity, Perception  PT Balance, Pain, Sensory, Motor, Endurance  OT Balance, Safety, Sensory, Skin Integrity, Endurance, Motor, Pain  SLP    TR         Basic ADL's: OT Grooming, Bathing, Dressing, Toileting     Advanced  ADL's: OT Other (comment) (child care)     Transfers: PT Bed Mobility, Bed to Chair, Car, Occupational psychologist, Research scientist (life sciences): PT Ambulation, Stairs     Additional Impairments: OT None  SLP        TR      Anticipated Outcomes Item Anticipated Outcome  Self Feeding No goal  Swallowing      Basic self-care  Supervision/setup-Mod I  Toileting  Mod I   Bathroom Transfers Supervision/setup-Mod I  Bowel/Bladder  supervision  Transfers  modified independent  Locomotion  modified independent  Communication     Cognition     Pain  <3   Safety/Judgment  supervision   Therapy Plan: PT Intensity: Minimum of 1-2 x/day ,45 to 90 minutes PT Frequency: 5 out of 7 days PT Duration Estimated Length of Stay: 7-10 days OT Intensity: Minimum of 1-2 x/day, 45 to 90 minutes OT Frequency: 5 out of 7 days OT Duration/Estimated Length of Stay: 7-10 days     Due to the current state of emergency, patients may not be receiving their 3-hours of Medicare-mandated therapy.   Team Interventions: Nursing Interventions Patient/Family Education, Disease Management/Prevention, Pain Management, Medication Management, Skin Care/Wound Management, Discharge Planning, Psychosocial Support  PT interventions Ambulation/gait training, Community reintegration, Disease management/prevention, Neuromuscular re-education,  Patient/family education, Skin care/wound management, Stair training, Therapeutic Exercise, UE/LE Coordination activities, UE/LE Strength taining/ROM, Therapeutic Activities, Splinting/orthotics, Pain management, DME/adaptive equipment instruction, Functional mobility training, Discharge planning, Psychosocial support, Functional electrical stimulation, Warden/ranger, Wheelchair propulsion/positioning  OT Interventions Warden/ranger, Discharge planning, Community reintegration, Fish farm manager, Disease mangement/prevention, Neuromuscular re-education, Psychosocial support, Functional mobility training, Patient/family education, Pain management, Self Care/advanced ADL retraining, Therapeutic Activities, Therapeutic Exercise, UE/LE Strength taining/ROM  SLP Interventions    TR Interventions    SW/CM Interventions Discharge Planning, Psychosocial Support, Patient/Family Education   Barriers to Discharge MD  Medical stability  Nursing Inaccessible home environment, Decreased caregiver support, Home environment access/layout, Wound Care, Medication compliance, Weight bearing restrictions, Lack of/limited family support    PT   Back precautions with 66mo old baby, husband works during the day if assistance is needed  OT Decreased caregiver support, Incontinence, Lack of/limited family support    SLP      SW Decreased caregiver support Husband may be able to get FMLA or not, will wait and see   Team Discharge Planning: Destination: PT-Home ,OT- Home , SLP-  Projected Follow-up: PT-Outpatient PT, OT-  Home health OT, SLP-  Projected Equipment Needs: PT-Rolling walker with 5" wheels, To be determined, OT- To be determined, SLP-  Equipment Details: PT- , OT-  Patient/family involved in discharge planning: PT- Patient, Family member/caregiver,  OT-Patient, Family member/caregiver, SLP-   MD ELOS: 7-10 days Medical Rehab Prognosis:  Excellent Assessment:  [name] is a 34 year old woman who is admitted with cauda equina syndrome. She is on Lovenox for anti-coagulation- will initiate  administration education. She continues to have no sensation of bowel or bladder. On bowel regimen and Flomax with intermittent catheterization-educating regarding self-catheterization. Vitals are being monitored TID and labs weekly. Pain is being managed with Mobic BID and Hydrocodone PRN. Educated regarding prognosis of symptoms.    See Team Conference Notes for weekly updates to the plan of care

## 2019-12-11 ENCOUNTER — Inpatient Hospital Stay (HOSPITAL_COMMUNITY): Payer: Medicaid Other | Admitting: Physical Therapy

## 2019-12-11 ENCOUNTER — Inpatient Hospital Stay (HOSPITAL_COMMUNITY): Payer: Medicaid Other | Admitting: Occupational Therapy

## 2019-12-11 ENCOUNTER — Inpatient Hospital Stay (HOSPITAL_COMMUNITY): Payer: Medicaid Other

## 2019-12-11 MED ORDER — TAMSULOSIN HCL 0.4 MG PO CAPS
0.8000 mg | ORAL_CAPSULE | Freq: Every day | ORAL | Status: DC
  Administered 2019-12-11 – 2019-12-23 (×13): 0.8 mg via ORAL
  Filled 2019-12-11 (×13): qty 2

## 2019-12-11 NOTE — Progress Notes (Signed)
Dig stim performed and suppository inserted. Night shift to follow up

## 2019-12-11 NOTE — Progress Notes (Signed)
Patient ID: Melanie Baldwin, female   DOB: December 09, 1985, 34 y.o.   MRN: [id number] Letter given to husband to give to employer regarding  missing week of work being here with wife.

## 2019-12-11 NOTE — Patient Care Conference (Signed)
Inpatient RehabilitationTeam Conference and Plan of Care Update Date: 12/11/2019   Time: 11:25 AM    Patient Name: Melanie Baldwin      Medical Record Number: [id number]  Date of Birth: [birth year] Sex: Female         Room/Bed: 4W01C/4W01C-01 Payor Info: Payor: [place] MEDICAID PREPAID HEALTH PLAN / Plan: [place] MEDICAID HEALTHY BLUE / Product Type: *No Product type* /    Admit Date/Time:  12/07/2019  4:11 PM  Primary Diagnosis:  Cauda equina spinal cord injury [facility])  Hospital Problems: Principal Problem:   Cauda equina spinal cord injury (HCC) Active Problems:   Acute blood loss anemia    Expected Discharge Date: Expected Discharge Date: 12/19/19  Team Members Present: Physician leading conference: Dr. Genice Rouge Care Coodinator Present: Cecile Sheerer, LCSWA;Trask Vosler Marlyne Beards, RN, BSN, CRRN Nurse Present: Adam Phenix, LPN PT Present: Peter Congo, PT OT Present: Ardis Rowan, COTA;Jennifer Katrinka Blazing, OT PPS Coordinator present : Edson Snowball, Park Breed, SLP     Current Status/Progress Goal Weekly Team Focus  Bowel/Bladder   Pt is incontinent of bowel and been getting cathed q6 per order due to not voiding. Also BS,pvr q4-q6. LBM-11/15  To become more continent. Also decreasing the need to cath.  Assess tolieting needs.   Swallow/Nutrition/ Hydration             ADL's   bathing-min A; LB dressing-max A; toilet transfers-CGA/min A; toileting-min A  mod I overall  education, functional tranfsers, BADL retraining, standing balance, activity tolerance   Mobility   CGA overall with RW  mod I  LE NMR, endurance, gait   Communication             Safety/Cognition/ Behavioral Observations            Pain   Has pain sometimes in her lower back. She can get Norco PRN if needed.  To decrease pain level.  Assess pain q shift or prn.   Skin   Surgican incision to mid-lower back covered w/ honeycomb dressing.  Promote healing and preventing skin breakdown from occurring.  Assess  skin q shift or prn.     Discharge Planning:  D/c to home with her husband; husband is trying to get FMLA if approved by employer. All famly is out of town.   Team Discussion: Patient is an I&O cath q 6hr, on the bowel program, pain is controlled. OT reports patient is contact guard for transfers. PT reports patient patient is contact guard overall. MD reports patient is very depressed, will need a mirror to begin learning how to self-cath. Patient on target to meet rehab goals: yes  *See Care Plan and progress notes for long and short-term goals.   Revisions to Treatment Plan:  Not at this time.  Teaching Needs: Continue family education, begin teaching I&O caths, bowel program  Current Barriers to Discharge: Inaccessible home environment, Decreased caregiver support, Home enviroment access/layout, Neurogenic bowel and bladder, Wound care, Lack of/limited family support, Weight bearing restrictions, Medication compliance and Behavior  Possible Resolutions to Barriers: Educate on I&O cath, bowel program, weight bearing precautions, wound care and dressing changes, continue current medications, educate safety awareness, provide emotional support.     Medical Summary Current Status: doing caths- i/o caths- q6 hours; on bowel program; depressed- pain controlled  Barriers to Discharge: Behavior;Decreased family/caregiver support;Home enviroment access/layout;Incontinence;Neurogenic Bowel & Bladder;Medical stability;Weight bearing restrictions;Weight;Other (comments)  Barriers to Discharge Comments: CGA 150 ft RW- decreased proprioception on LEs- Neurogenic bowel and bladder- needs to  learn how to do B/B programs Possible Resolutions to [employer] Focus: bowel program- needs mirror for in/out caths- to start seeing her.   d/c tentatively- 11/24- but might push to 11/29 for B/B issues   Continued Need for Acute Rehabilitation Level of Care: The patient requires daily medical management  by a physician with specialized training in physical medicine and rehabilitation for the following reasons: Direction of a multidisciplinary physical rehabilitation program to maximize functional independence : Yes Medical management of patient stability for increased activity during participation in an intensive rehabilitation regime.: Yes Analysis of laboratory values and/or radiology reports with any subsequent need for medication adjustment and/or medical intervention. : Yes   I attest that I was present, lead the team conference, and concur with the assessment and plan of the team.   Tennis Must 12/11/2019, 4:24 PM

## 2019-12-11 NOTE — Progress Notes (Signed)
Physical Therapy Session Note  Patient Details  Name: Melanie Baldwin MRN: [id number] Date of Birth: 09-Oct-1985  Today's Date: 12/11/2019 PT Individual Time: 1300-1400 PT Individual Time Calculation (min): 60 min   Short Term Goals: Week 1:  PT Short Term Goal 1 (Week 1): STGs = LTGs  Skilled Therapeutic Interventions/Progress Updates:    Pt received supine in bed, agreeable to PT session. No complaints of pain. NT in room to assess vitals, supine BP initially 92/77, then reassessed to be 108/75. Pt with no complaints of dizziness or feeling lightheaded. Bed mobility Supervision with use of bedrail. Sit to stand with CGA to RW throughout session. Ambulation 2 x 200 ft with RW and CGA for balance, decreased heel strike noted R>L. Standing alt L/R 4" step-taps initially with RW progressing to min HHA with RUE. Progressing to standing alt L/R cone taps initially with RW progressing to min HHA. Pt exhibits decreased control and strength in RLE > LLE. Sit to stand x 5 reps, x 10 reps with no UE support and CGA to min A for anterior weight shift and standing balance. Seated BP 142/94 following activity. Pt requests to return to bed at end of session, Supervision for bed mobility. Pt left sidelying in bed with needs in reach, bed alarm in place at end of session.  Therapy Documentation Precautions:  Precautions Precautions: Back Precaution Comments: per MD orders, no brace needed Restrictions Weight Bearing Restrictions: No  Therapy/Group: Individual Therapy   Peter Congo, PT, DPT  12/11/2019, 5:01 PM

## 2019-12-11 NOTE — Progress Notes (Signed)
Patient ID: Melanie Baldwin, female   DOB: 07/23/1985, 34 y.o.   MRN: [id number]  Met with pt and husband who was present to discuss team conference goals mod/i level and target discharge 11/24. Husband waiting for FMLA paperwork from employer. Pt starting to learn I & O Cath and will need to learn bowel program prior to discharge also. Husband wants to know when she can go back to normal and not ned to do this. Encouraged to talk with MD and RN regarding this. Will continue to work on discharge needs.

## 2019-12-11 NOTE — Progress Notes (Signed)
Occupational Therapy Session Note  Patient Details  Name: Melanie Baldwin MRN: [id number] Date of Birth: [birth year]  Today's Date: 12/11/2019 OT Individual Time: 3013-1438 OT Individual Time Calculation (min): 60 min    Short Term Goals: Week 1:  OT Short Term Goal 1 (Week 1): STGs=LTGs due to ELOS  Skilled Therapeutic Interventions/Progress Updates:    Pt seen this session to focus on LE strength and functional mobility with self care.  Pt received in bed and sat to EOB with S.  Used RW to ambulate to shower with CGA. Pt doffed pants in standing, stepping out of pant legs with support from RW.  Sat on shower seat, to bathe most of her body. She followed her back precautions well, crossing her legs to wash feet.  Pt stood with CGA and support from grab bar to wash her bottom, but had continuous leakage of bowel. Washed for several minutes but her bowels kept leaking.  The eventually stopped enough to dry off and on her briefs with mod A. Pt donned pants (crossing legs) with min A. And socks with set up. Pt sat in wc to dry her hair. Pt in wc with all needs met. Her NT aware of where she was and he planned to check on her as she would likely need to rest prior to next session.   Therapy Documentation Precautions:  Precautions Precautions: Back Precaution Comments: per MD orders, no brace needed Restrictions Weight Bearing Restrictions: No Pain: Pain Assessment Pain Score: 0-No pain ADL: ADL Eating: Independent Grooming: Independent Where Assessed-Grooming: Sitting at sink Upper Body Bathing: Setup Where Assessed-Upper Body Bathing: Shower Lower Body Bathing: Contact guard Where Assessed-Lower Body Bathing: Shower Upper Body Dressing: Setup Where Assessed-Upper Body Dressing: Edge of bed Lower Body Dressing: Moderate assistance Where Assessed-Lower Body Dressing: Wheelchair, Sitting at sink, Standing at sink Toileting: Not assessed Toilet Transfer: Minimal assistance Toilet  Transfer Method: Ambulating (RW) Science writer: Energy manager: Curator Method: Heritage manager: Shower seat with back   Therapy/Group: Individual Therapy  [place] 12/11/2019, 12:31 PM

## 2019-12-11 NOTE — Progress Notes (Signed)
[place] PHYSICAL MEDICINE & REHABILITATION PROGRESS NOTE   Subjective/Complaints:  Pt reports had bowel program overnight- thinks had success, but has no feeling.  Also had stomach pain yesterday and felt dizzy and nausea- BP was OK- maybe due to meds?    ROS:  Pt denies SOB, abd pain, CP, N/V/C/D, and vision changes   Objective:   No results found. Recent Labs    12/10/19 0622  WBC 9.1  HGB 11.8*  HCT 37.3  PLT 206   Recent Labs    12/10/19 0622  NA 137  K 4.3  CL 105  CO2 22  GLUCOSE 111*  BUN 17  CREATININE 0.57  CALCIUM 8.9    Intake/Output Summary (Last 24 hours) at 12/11/2019 0953 Last data filed at 12/11/2019 0731 Gross per 24 hour  Intake 840 ml  Output 2730 ml  Net -1890 ml        Physical Exam: Vital Signs Blood pressure 110/70, pulse 100, temperature 98.5 F (36.9 C), temperature source Oral, resp. rate 18, height 5\' 4"  (1.626 m), weight 79.3 kg, SpO2 99 %, unknown if currently breastfeeding. Constitutional: No distress . Vital signs reviewed. Sitting up in bed; appropriate, sad affect, NAD HENT: Normocephalic.  Atraumatic. Eyes: EOMI. No discharge. Cardiovascular: RRR Respiratory: CTA B/L- no W/R/R- good air movement GI: Soft, NT, ND, (+)BS -normoactive Skin: Warm and dry.  Incision with dressing CDI Psych: sad affect, but better than this weekend Musc: No edema in extremities.  No tenderness in extremities. Neuro: Alert Motor: Bilateral upper extremities: 5/5 proximal to distal B/l LE: HF 4+/5, KE 4-4+/5, DF/PF 4+/5 Sensation diminished to light touch b/l LE   Sensation decreased from L3- S2 B/L- as well as S3-S5 B/L  Assessment/Plan: 1. Functional deficits which require 3+ hours per day of interdisciplinary therapy in a comprehensive inpatient rehab setting.  Physiatrist is providing close team supervision and 24 hour management of active medical problems listed below.  Physiatrist and rehab team continue to assess barriers to  discharge/monitor patient progress toward functional and medical goals  Care Tool:  Bathing    Body parts bathed by patient: Right arm, Left arm, Chest, Abdomen   Body parts bathed by helper: Front perineal area, Buttocks     Bathing assist Assist Level: Minimal Assistance - Patient > 75%     Upper Body Dressing/Undressing Upper body dressing   What is the patient wearing?: Pull over shirt    Upper body assist Assist Level: Set up assist    Lower Body Dressing/Undressing Lower body dressing      What is the patient wearing?: Incontinence brief, Pants     Lower body assist Assist for lower body dressing: Maximal Assistance - Patient 25 - 49%     Toileting Toileting Toileting Activity did not occur (Clothing management and hygiene only): N/A (no void or bm)  Toileting assist Assist for toileting: Minimal Assistance - Patient > 75%     Transfers Chair/bed transfer  Transfers assist     Chair/bed transfer assist level: Minimal Assistance - Patient > 75%     Locomotion Ambulation   Ambulation assist      Assist level: Contact Guard/Touching assist Assistive device: Walker-rolling Max distance: 48ft   Walk 10 feet activity   Assist     Assist level: Contact Guard/Touching assist Assistive device: Walker-rolling   Walk 50 feet activity   Assist    Assist level: Contact Guard/Touching assist Assistive device: Walker-rolling    Walk 150 feet activity  Assist    Assist level: Minimal Assistance - Patient > 75% Assistive device: Walker-rolling    Walk 10 feet on uneven surface  activity   Assist     Assist level: Minimal Assistance - Patient > 75% Assistive device: Photographer Will patient use wheelchair at discharge?: No      Wheelchair assist level: Independent      Wheelchair 50 feet with 2 turns activity    Assist            Wheelchair 150 feet activity     Assist           Blood pressure 110/70, pulse 100, temperature 98.5 F (36.9 C), temperature source Oral, resp. rate 18, height 5\' 4"  (1.626 m), weight 79.3 kg, SpO2 99 %, unknown if currently breastfeeding.  Medical Problem List and Plan: 1.  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  Continue CIR 2.  Antithrombotics: -DVT/anticoagulation:  Mechanical: Sequential compression devices, below knee Bilateral lower extremities  Lovenox- if not walking, needs 3 months; if walks, needs 2 months             -antiplatelet therapy: N/A 3. Pain Management: Mobic twice daily with hydrocodone as needed.             Monitor with increased exertion for post-operative and neuropathic pain  11/16- denies any pain currently- just numbness- con't regimen as needed 4. Mood: LCSW to follow for evaluation and support             -antipsychotic agents: N/A 5. Neuropsych: This patient is capable of making decisions on her own behalf. 6. Skin/Wound Care: Monitor wound for healing.  Routine pressure-relief measures.  Itching- con't benadryl and add eucerin BID 7. Fluids/Electrolytes/Nutrition: Monitor I/Os.               BMP within acceptable range on 11/13 8.  Fasting hyperglycemia: Likely due to prior steroids and/or IV dextrose--question impaired fasting glucose?               Blood glucose mildly elevated on 11/15 9.  Neurogenic bowel: Reports that she has not had BM for days prior to admission 11/09.   Senna-S 2 tabs changed to daily with suppository at nights.    Multiple bowel movements   11/16- bowel program overnight- con't regimen- will take 4-6 weeks to train bowels 10. Neurogenic bladder:   Started Floamx 0.4 mg nightly to see if can help pt void.   Foley DC'd on 11/15  11/16- will increase flomax to 0.8 mg qsupper- see if can void but we discussed watching caths since pt wants to go home independent!  11.  Acute blood loss  anemia  Hemoglobin 11.8 on 11/15  Continue to monitor   LOS: 4 days A FACE TO FACE EVALUATION WAS PERFORMED  Trygg Mantz 12/11/2019, 9:53 AM

## 2019-12-11 NOTE — Progress Notes (Signed)
Occupational Therapy Session Note  Patient Details  Name: Melanie Baldwin MRN: [id number] Date of Birth: 08/29/1985  Today's Date: 12/11/2019 OT Individual Time: 1130-1145 OT Individual Time Calculation (min): 15 min  and Today's Date: 12/11/2019 OT Missed Time: 15 Minutes Missed Time Reason: Nursing care   Short Term Goals: Week 1:  OT Short Term Goal 1 (Week 1): STGs=LTGs due to ELOS  Skilled Therapeutic Interventions/Progress Updates:    Pt resting in bed upon arrival with husband present. Educated pt on use of inspection/self cath mirror. Explained that nursing will begin training of self cath and bowel program. Husband and pt questioned how long before pt will be able to void and have BM independently. Explained that it is too early in process to predict. Explained that MD will continue to follow pt after discharge. NT entered room to scan bladder and catherize. Pt remained in bed with husband present.   Therapy Documentation Precautions:  Precautions Precautions: Back Precaution Comments: per MD orders, no brace needed Restrictions Weight Bearing Restrictions: No General: General OT Amount of Missed Time: 15 Minutes    Pain: Pain Assessment Pain Scale: 0-10 Pain Score: 0-No pain   Therapy/Group: Individual Therapy  Rich Brave 12/11/2019, 12:06 PM

## 2019-12-11 NOTE — Progress Notes (Signed)
Physical Therapy Session Note  Patient Details  Name: Melanie Baldwin MRN: [id number] Date of Birth: 10/31/1985  Today's Date: 12/11/2019 PT Individual Time: 1445-1530 PT Individual Time Calculation (min): 45 min   Short Term Goals: Week 1:  PT Short Term Goal 1 (Week 1): STGs = LTGs  Skilled Therapeutic Interventions/Progress Updates:    pt received in bed and agreeable to therapy. Pt directed in supine>sit CGA; Sit to stand to Rolling walker at [facility], pt reported some "burning" pain in low back but did not rank, nursing made aware and pain meds brought to pt. Pt directed in gait training to gym with Rolling walker 200' CGA, noted pelvic sway, inconsistent step length and benefited from [facility] for increased core activation, decreased step length, trunk extension. Pt directed in dynamic standing balance training at BITS system for x3 activities for forward reaching in various directions with added cognitive challenge and VC for decreased upper support at walker, min A frequently for righting balance 2/2 LOB in various directions but pt able to correct with manual assistance and reattempt. Pt directed in static standing without UE support min A improving to CGA intermittently, with R and L modified Romberg stance for 4x30s each, then progressed to R leading full Romberg stance 4x30s CGA; then L leading Romberg min A consistently with noted weaker balance on R as support limb, benefited from VC for weight shifting to RLE which improve all for improved standing balance and increased BLE proprioception. Pt directed in dynamic standing balance training at wall with ball for 3x15 forward ball rolls, lateral ball rolls, and static ball placement with switching BUE each time for increased stability, no LOB noted. Pt requrested to use WC to return to room 2/2 fatigue and total A with WC mobility to return to room, pt then directed in 5' gait with Rolling walker to bedside, sit>supine CGA. Pt left in bed, alarm set, All  needs in reach and in good condition. Call light in hand.    Therapy Documentation Precautions:  Precautions Precautions: Back Precaution Comments: per MD orders, no brace needed Restrictions Weight Bearing Restrictions: No General:   Vital Signs: Therapy Vitals Temp: 98.6 F (37 C) Temp Source: Oral Pulse Rate: (!) 108 Resp: 16 BP: 108/75 Patient Position (if appropriate): Lying Oxygen Therapy SpO2: 98 % O2 Device: Room Air   Therapy/Group: Individual Therapy  Barbaraann Faster 12/11/2019, 3:49 PM

## 2019-12-12 ENCOUNTER — Encounter (HOSPITAL_COMMUNITY): Payer: Medicaid Other | Admitting: Psychology

## 2019-12-12 ENCOUNTER — Inpatient Hospital Stay (HOSPITAL_COMMUNITY): Payer: Medicaid Other

## 2019-12-12 ENCOUNTER — Inpatient Hospital Stay (HOSPITAL_COMMUNITY): Payer: Medicaid Other | Admitting: Physical Therapy

## 2019-12-12 DIAGNOSIS — S343XXS Injury of cauda equina, sequela: Secondary | ICD-10-CM

## 2019-12-12 LAB — URINALYSIS, ROUTINE W REFLEX MICROSCOPIC
Bilirubin Urine: NEGATIVE
Glucose, UA: NEGATIVE mg/dL
Ketones, ur: NEGATIVE mg/dL
Nitrite: POSITIVE — AB
Protein, ur: 30 mg/dL — AB
Specific Gravity, Urine: 1.02 (ref 1.005–1.030)
WBC, UA: >50 WBC/hpf — ABNORMAL HIGH (ref 0–5)
pH: 6 (ref 5.0–8.0)

## 2019-12-12 MED ORDER — GERHARDT'S BUTT CREAM
TOPICAL_CREAM | Freq: Four times a day (QID) | CUTANEOUS | Status: DC
  Administered 2019-12-12 – 2019-12-23 (×5): 1 via TOPICAL
  Filled 2019-12-12 (×4): qty 1

## 2019-12-12 MED ORDER — GABAPENTIN 100 MG PO CAPS: 100.0000 mg | ORAL_CAPSULE | Freq: Every day | ORAL | Status: DC

## 2019-12-12 MED ORDER — GABAPENTIN 100 MG PO CAPS
100.0000 mg | ORAL_CAPSULE | Freq: Two times a day (BID) | ORAL | Status: DC
  Administered 2019-12-12 – 2019-12-14 (×4): 100 mg via ORAL
  Filled 2019-12-12 (×4): qty 1

## 2019-12-12 MED ORDER — GERHARDT'S BUTT CREAM
TOPICAL_CREAM | CUTANEOUS | Status: DC | PRN
  Administered 2019-12-18: 1 via TOPICAL
  Filled 2019-12-12: qty 1

## 2019-12-12 MED ORDER — FLEET ENEMA 7-19 GM/118ML RE ENEM
1.0000 | ENEMA | Freq: Once | RECTAL | Status: AC
  Administered 2019-12-12: 1 via RECTAL
  Filled 2019-12-12: qty 1

## 2019-12-12 MED ORDER — HYDROCORTISONE (PERIANAL) 2.5 % EX CREA
1.0000 "application " | TOPICAL_CREAM | Freq: Two times a day (BID) | CUTANEOUS | Status: DC
  Administered 2019-12-15 – 2019-12-23 (×14): 1 via RECTAL
  Filled 2019-12-12 (×3): qty 28.35

## 2019-12-12 MED ORDER — HYDROCORTISONE ACETATE 25 MG RE SUPP
25.0000 mg | Freq: Every day | RECTAL | Status: DC
  Administered 2019-12-12: 25 mg via RECTAL
  Filled 2019-12-12 (×2): qty 1

## 2019-12-12 MED ORDER — FLUCONAZOLE 100 MG PO TABS
200.0000 mg | ORAL_TABLET | Freq: Once | ORAL | Status: AC
  Administered 2019-12-12: 200 mg via ORAL
  Filled 2019-12-12: qty 2

## 2019-12-12 MED ORDER — FLUCONAZOLE 100 MG PO TABS
100.0000 mg | ORAL_TABLET | Freq: Every day | ORAL | Status: AC
  Administered 2019-12-13 – 2019-12-16 (×4): 100 mg via ORAL
  Filled 2019-12-12 (×4): qty 1

## 2019-12-12 NOTE — Progress Notes (Signed)
   12/12/19 1400  Clinical Encounter Type  Visited With Patient not available  Visit Type Initial  Referral From Nurse  Consult/Referral To Chaplain  Spiritual Encounters  Spiritual Needs Other (Comment)  Stress Factors  Patient Stress Factors None identified  Family Stress Factors None identified  Chaplain took AD information to patient's room as patient was placed on consult list by nurse. Patient was in PT and not available. Information was left on patient's bed. Chaplain available when needed.

## 2019-12-12 NOTE — Progress Notes (Signed)
Occupational Therapy Session Note  Patient Details  Name: Melanie Baldwin MRN: [id number] Date of Birth: 11/29/1985  Today's Date: 12/12/2019 OT Individual Time: 1300-1400 OT Individual Time Calculation (min): 60 min    Short Term Goals: Week 1:  OT Short Term Goal 1 (Week 1): STGs=LTGs due to ELOS  Skilled Therapeutic Interventions/Progress Updates:    Pt resting in bed upon arrival. Pt declined bathing/dressing this afternoon. OT intervention with focus on sit<>stand, standing balance, and functional amb with RW to increase independence with BADLs. Pt amb with RW from room to Day Room and initially engaged in Stephan activities with focus on BUE use outside BOS and across midline. 2 min X 4 with CGA and LOB X 2. Pt engaged in Dynavision activity on AirEx 1 min X 2 with min A for balance. Pt transitioned to gym and engaged in sit<>stand 8X 3 without RW and hands placed on knees when pushing up. LOB X 2 after standing. Pt returned to room and amb with RW from doorway to bed. Pt remained seated EOB with NT present.   Therapy Documentation Precautions:  Precautions Precautions: Back Precaution Comments: per MD orders, no brace needed Restrictions Weight Bearing Restrictions: No  Pain:  Pt denied pain this afternoon     Therapy/Group: Individual Therapy  Rich Brave 12/12/2019, 2:03 PM

## 2019-12-12 NOTE — Consult Note (Signed)
  Patient not seen due to bowl/bladder care during time.

## 2019-12-12 NOTE — Progress Notes (Signed)
Physical Therapy Session Note  Patient Details  Name: Melanie Baldwin MRN: [id number] Date of Birth: [birth year]  Today's Date: 12/12/2019 PT Individual Time: 2952-8413 PT Individual Time Calculation (min): 43 min   Short Term Goals: Week 1:  PT Short Term Goal 1 (Week 1): STGs = LTGs  Skilled Therapeutic Interventions/Progress Updates:    Patient received sidelying in bed, agreeable to PT. She does endorse "discomfort" in her low back, but relates it to her stitches in her low back. She was able to come to sitting edge of bed with supervision and verbal cues to adhere to back precautions. Patient transferring to wc via stand pivot with CGA. PT propelling patient in wc to therapy gym for time management and energy conservation. She was able to complete sit <> stand with alternating LE on 6" box using RW and CGA 2x5. Patient noting increased difficulty completing this task when R LE was placed on 6" box indicating decreased functional LE strength in this limb. Patient completing reciprocal stepping onto 6" step with B UE support on hand rails. Patient requiring use of momentum initially to complete full step up. Increased difficulty completing this task noted when cued to not use momentum. Patient ambulating ~219ft back to her room with RW, CGA. Slow gait speed and excessive lateral pelvic displacement noted. No significant distal impairments noted from this gait pattern (heel whip or trendelenburg). This lateral pelvic displacement likely related to decreased B glute strength. Patient requesting to remain sitting up on edge of bed, spouse at bedside, bed alarm on, call light within reach.   Therapy Documentation Precautions:  Precautions Precautions: Back Precaution Comments: per MD orders, no brace needed Restrictions Weight Bearing Restrictions: No     Therapy/Group: Individual Therapy  Elizebeth Koller, PT, DPT, CBIS  12/12/2019, 7:39 AM

## 2019-12-12 NOTE — Progress Notes (Addendum)
Patient reports burning of RLE as well as perineum. She has not used any hydrocodone since 11/15 am. Nurse also reported "buring around rectum" and ansuol cream ordered additionally this am. Per reports she has been stooling frequently today with reports of chapped buttocks. She was very emotional about neurogenic B/B issues as well as neuropathy and "pain down there". We briefly discussed education to help promote independence as well as time needed for recovery.    On exam, patient with abraded bleeding peri-rectal area due to ongoing soiling--anus open due to lack of rectal tone and stool as rectal exam revealed formed stool an inch within rectum. She did report feeling rectal pressure at times. Did not attempt to disimpact as was awaiting cath but did write order for enema to help with evacuation. Tech reports lot of vaginal drainage--question yeast infection/UTI will order UA/UCS and recheck KUB.  Will add low dose gabapentin for pain control.

## 2019-12-12 NOTE — Progress Notes (Signed)
Physical Therapy Session Note  Patient Details  Name: Melanie Baldwin MRN: [id number] Date of Birth: 1985/06/04  Today's Date: 12/12/2019 PT Individual Time: 0800-0900; 6578-4696 PT Individual Time Calculation (min): 60 min and 46 min  Short Term Goals: Week 1:  PT Short Term Goal 1 (Week 1): STGs = LTGs  Skilled Therapeutic Interventions/Progress Updates:    Session 1: Pt received supine in bed, agreeable to PT session. No complaints of pain at rest, does report slightly painful sensation on bottom of R foot during ambulation as well as increase in sensation in BLE this date. Bed mobility Supervision with use of bedrails. Sit to stand with CGA to RW throughout session. Pt able to perform oral hygiene at sink with setup A. Ambulation x 200 ft, 2 x 150 ft with RW and CGA, decreased B step length noted. Trial gait with no AD/intermittent UE assist on rail in hallway, 4 x 30 ft with CGA for balance. Pt exhibits decreased gait speed and occasional ataxia without use of AD that increases with onset of fatigue. Sidesteps L/R x 30 ft each direction with BUE support on rail in hallway and CGA progressing to sidesteps 2 x 30 ft with use of orange theraband around ankles for increased resistance and challenge. Backwards ambulation 4 x 30 ft with one UE support on rail and CGA for balance, focus on widening BOS and RLE control. Static standing balance performing ball toss with no UE support but occasional reliance on RW to recover balance. Pt tends to lose balance posteriorly in standing, cues for ankle strategy. Pt returned to bed at end of session, Supervision for bed mobility. Pt left sidelying in bed with needs in reach, bed alarm in place at end of session.  Session 2: Pt received sidelying in bed, agreeable to PT session. No complaints of pain. Bed mobility Supervision. Assisted pt with donning socks and shoes while seated EOB. Sit to stand with CGA to RW. Ambulation 2 x 150 ft with use of RW and CGA to  close Supervision for balance. Provided pt with regular height walker for improved fit vs youth RW. Alt L/R 6" step-ups with BUE support on rails and CGA for balance, 2 x 15 reps each. Sit to/from supine on real bed in therapy apartment to simulate home environment with mod A initially for BLE management progressing to Supervision with removal of pt's shoes. Per pt report her bed at home is very tall but her husband plans on adjusting bed or purchasing new mattress prior to pt d/c home next week. Sit to stand from low, pliable surface of couch with min A needed due to surface height. Per pt report she does not have any low, soft furniture at home. Pt returned to bed at end of session at Supervision level. Pt left in L sidelying in bed with needs in reach, bed alarm in place at end of session.  Therapy Documentation Precautions:  Precautions Precautions: Back Precaution Comments: per MD orders, no brace needed Restrictions Weight Bearing Restrictions: No   Therapy/Group: Individual Therapy   Peter Congo, PT, DPT  12/12/2019, 9:26 AM

## 2019-12-12 NOTE — Progress Notes (Signed)
[facility] PHYSICAL MEDICINE & REHABILITATION PROGRESS NOTE   Subjective/Complaints:  Pt reports doing well- bowel program going OK-  Did have blood with bowel program.   Having foot pain- like from walking- burning last night of LLE.    ROS:   Pt denies SOB, abd pain, CP, N/V/C/D, and vision changes    Objective:   No results found. Recent Labs    12/10/19 0622  WBC 9.1  HGB 11.8*  HCT 37.3  PLT 206   Recent Labs    12/10/19 0622  NA 137  K 4.3  CL 105  CO2 22  GLUCOSE 111*  BUN 17  CREATININE 0.57  CALCIUM 8.9    Intake/Output Summary (Last 24 hours) at 12/12/2019 1130 Last data filed at 12/12/2019 4008 Gross per 24 hour  Intake 600 ml  Output 1971 ml  Net -1371 ml        Physical Exam: Vital Signs Blood pressure 108/68, pulse (!) 102, temperature 98.7 F (37.1 C), resp. rate 16, height 5\' 4"  (1.626 m), weight 79.3 kg, SpO2 100 %, unknown if currently breastfeeding. Constitutional: No distress . Up walking with no AD, HHA with PT, NAD- off balance HENT: Normocephalic.  Atraumatic. Eyes: EOMI. No discharge. Cardiovascular: RRR Respiratory: CTA B/L- no W/R/R- good air movement GI: Soft, NT, ND, (+)BS  Skin: Warm and dry.  Incision with dressing CDI Psych: sad affect, but better Musc: No edema in extremities.  No tenderness in extremities. Neuro: Alert Motor: Bilateral upper extremities: 5/5 proximal to distal B/l LE: HF 4+/5, KE 4-4+/5, DF/PF 4+/5 Sensation diminished to light touch b/l LE   Sensation decreased from L3- S2 B/L- as well as S3-S5 B/L  Assessment/Plan: 1. Functional deficits which require 3+ hours per day of interdisciplinary therapy in a comprehensive inpatient rehab setting.  Physiatrist is providing close team supervision and 24 hour management of active medical problems listed below.  Physiatrist and rehab team continue to assess barriers to discharge/monitor patient progress toward functional and medical goals  Care  Tool:  Bathing    Body parts bathed by patient: Right arm, Left arm, Chest, Abdomen, Front perineal area, Buttocks, Right upper leg, Left upper leg, Right lower leg, Left lower leg, Face   Body parts bathed by helper: Front perineal area, Buttocks     Bathing assist Assist Level: Contact Guard/Touching assist (in standing to wash bottom)     Upper Body Dressing/Undressing Upper body dressing   What is the patient wearing?: Pull over shirt    Upper body assist Assist Level: Set up assist    Lower Body Dressing/Undressing Lower body dressing      What is the patient wearing?: Incontinence brief, Pants     Lower body assist Assist for lower body dressing: Moderate Assistance - Patient 50 - 74%     Toileting Toileting Toileting Activity did not occur and hygiene only): N/A (no void or bm)  Toileting assist Assist for toileting: Minimal Assistance - Patient > 75%     Transfers Chair/bed transfer  Transfers assist     Chair/bed transfer assist level: Contact Guard/Touching assist     Locomotion Ambulation   Ambulation assist      Assist level: Contact Guard/Touching assist Assistive device: Walker-rolling Max distance: 200'   Walk 10 feet activity   Assist     Assist level: Contact Guard/Touching assist Assistive device: Walker-rolling   Walk 50 feet activity   Assist    Assist level: Contact Guard/Touching assist  Assistive device: Walker-rolling    Walk 150 feet activity   Assist    Assist level: Contact Guard/Touching assist Assistive device: Walker-rolling    Walk 10 feet on uneven surface  activity   Assist     Assist level: Minimal Assistance - Patient > 75% Assistive device: Photographer Will patient use wheelchair at discharge?: No      Wheelchair assist level: Independent      Wheelchair 50 feet with 2 turns activity    Assist            Wheelchair 150  feet activity     Assist          Blood pressure 108/68, pulse (!) 102, temperature 98.7 F (37.1 C), resp. rate 16, height 5\' 4"  (1.626 m), weight 79.3 kg, SpO2 100 %, unknown if currently breastfeeding.  Medical Problem List and Plan: 1.  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  Continue CIR 2.  Antithrombotics: -DVT/anticoagulation:  Mechanical: Sequential compression devices, below knee Bilateral lower extremities  Lovenox- if not walking, needs 3 months; if walks, needs 2 months             -antiplatelet therapy: N/A 3. Pain Management: Mobic twice daily with hydrocodone as needed.             Monitor with increased exertion for post-operative and neuropathic pain  11/16- denies any pain currently- just numbness- con't regimen as needed  11/17- having some foot pain from walking- sounds like might be having "nerves wake up"- might need nerve pain meds.  4. Mood: LCSW to follow for evaluation and support             -antipsychotic agents: N/A 5. Neuropsych: This patient is capable of making decisions on her own behalf. 6. Skin/Wound Care: Monitor wound for healing.  Routine pressure-relief measures.  Itching- con't benadryl and add eucerin BID 7. Fluids/Electrolytes/Nutrition: Monitor I/Os.               BMP within acceptable range on 11/13 8.  Fasting hyperglycemia: Likely due to prior steroids and/or IV dextrose--question impaired fasting glucose?               Blood glucose mildly elevated on 11/15 9.  Neurogenic bowel: Reports that she has not had BM for days prior to admission 11/09.   Senna-S 2 tabs changed to daily with suppository at nights.    Multiple bowel movements   11/16- bowel program overnight- con't regimen- will take 4-6 weeks to train bowels  11/17- add Anusol- for hemorrhoids.  10. Neurogenic bladder:   Started Floamx 0.4 mg nightly to see if can help pt void.   Foley DC'd  on 11/15  11/16- will increase flomax to 0.8 mg qsupper- see if can void but we discussed watching caths since pt wants to go home independent!   11/17- get pt mirror to watch- spoke to OT 11.  Acute blood loss anemia  Hemoglobin 11.8 on 11/15  Continue to monitor   LOS: 5 days A FACE TO FACE EVALUATION WAS PERFORMED  Jamoni Hewes 12/12/2019, 11:30 AM

## 2019-12-13 ENCOUNTER — Inpatient Hospital Stay (HOSPITAL_COMMUNITY): Payer: Medicaid Other

## 2019-12-13 ENCOUNTER — Inpatient Hospital Stay (HOSPITAL_COMMUNITY): Payer: Medicaid Other | Admitting: *Deleted

## 2019-12-13 ENCOUNTER — Inpatient Hospital Stay (HOSPITAL_COMMUNITY): Payer: Medicaid Other | Admitting: Occupational Therapy

## 2019-12-13 ENCOUNTER — Inpatient Hospital Stay (HOSPITAL_COMMUNITY): Payer: Medicaid Other | Admitting: Physical Therapy

## 2019-12-13 MED ORDER — CEPHALEXIN 250 MG PO CAPS
500.0000 mg | ORAL_CAPSULE | Freq: Two times a day (BID) | ORAL | Status: DC
  Administered 2019-12-13 – 2019-12-14 (×3): 500 mg via ORAL
  Filled 2019-12-13 (×3): qty 2

## 2019-12-13 MED ORDER — HYDROCORTISONE ACETATE 25 MG RE SUPP
25.0000 mg | Freq: Two times a day (BID) | RECTAL | Status: DC
  Administered 2019-12-13 – 2019-12-24 (×20): 25 mg via RECTAL
  Filled 2019-12-13 (×20): qty 1

## 2019-12-13 NOTE — Progress Notes (Signed)
Occupational Therapy Session Note  Patient Details  Name: Melanie Baldwin MRN: [id number] Date of Birth: 1985-08-14  Today's Date: 12/13/2019 OT Individual Time: 7800-4471 OT Individual Time Calculation (min): 58 min    Short Term Goals: Week 1:  OT Short Term Goal 1 (Week 1): STGs=LTGs due to ELOS  Skilled Therapeutic Interventions/Progress Updates:    Pt greeted at time of session reclined in bed resting agreeable to OT session, no c/o pain throughout. Time spent at beginning of session for problem solving back precautions during ADL, pt with good carryover and recall from am session. Supine to sit supervision, sit to stand and walked short distance to wheelchair CGA with RW. Transported to ADL apartment total A for time management and energy conservation, performed TTB transfer in/out of tub with CGA after therapist demonstration, pt liking TTB over shower seat, hand out provided later. Discussed where to purchase and pricing as these are not likely covered by insurance (states she has Medicaid so unsure). Also discussed suction cup grab bars as she lives in an apartment. Walked approx 15 feet w/c <> mat with CGA with RW, seated therex w/ 4# weight progressing to standing with no UE support and performing same bicep curls and chest press x15 reps. Transported back to room via wheelchair and stand pivot > bed CGA with RW. Pt resting in supine with all needs met, alarm on, call bell in reach.   Therapy Documentation Precautions:  Precautions Precautions: Back Precaution Comments: per MD orders, no brace needed Restrictions Weight Bearing Restrictions: No     Therapy/Group: Individual Therapy  Viona Gilmore 12/13/2019, 4:36 PM

## 2019-12-13 NOTE — Progress Notes (Signed)
Pt had a medium soft brown stool. Area around anus red with small amount of bleeding. Area cleaned well and cream applied.

## 2019-12-13 NOTE — Progress Notes (Signed)
Occupational Therapy Session Note  Patient Details  Name: Melanie Baldwin MRN: [id number] Date of Birth: 1985/10/16  Today's Date: 12/13/2019 OT Individual Time: 9323-5573 OT Individual Time Calculation (min): 65 min    Short Term Goals: Week 1:  OT Short Term Goal 1 (Week 1): STGs=LTGs due to ELOS  Skilled Therapeutic Interventions/Progress Updates:    Pt received in bed ready for a shower. She was able to sit to EOB with S and sat for several minutes working on LE AROM as therapist gathered supplies. Pt stood up with RW and ambulated to bathroom with RW with CGA.  Pt's brief removed (had soft Bowel in brief) , pt sat on toilet and had more bowel that was firmer.  Provided pt with soft wipes as toilet paper and washcloths are too rough for her.  Pt cleansed numerous times with blood on clothes and continued stool release.  Pt stepped into shower on open seat BSC.  She had more BM release.  Pt bathed in sitting with S/U and used shower hose to cleanse her bottom.  Stepped back to toilet to don brief with mod A, LB clothing min A (she was sitting on toilet and feet not fully touching ground) and UB set up.  Ambulated to sink to stand and brush teeth with S.    To determine good bed height, had pt sit on edge of bed and measured height.  A height of 22-23 inches would be best for her, wrote down recommendations for her spouse.    Pt resting in wc with nursing and PA in room to discuss her bowel program.  Pt set up to finish working on her hair at sink with hair dryer.      Therapy Documentation Precautions:  Precautions Precautions: Back Precaution Comments: per MD orders, no brace needed Restrictions Weight Bearing Restrictions: No   Pain: Pain Assessment Pain Scale: 0-10 Pain Score: 0-No pain ADL: ADL Eating: Independent Grooming: Independent Where Assessed-Grooming: Sitting at sink Upper Body Bathing: Setup Where Assessed-Upper Body Bathing: Shower Lower Body Bathing:  Contact guard Where Assessed-Lower Body Bathing: Shower Upper Body Dressing: Setup Where Assessed-Upper Body Dressing: Edge of bed Lower Body Dressing: Moderate assistance Where Assessed-Lower Body Dressing: Wheelchair, Sitting at sink, Standing at sink Toileting: Not assessed Toilet Transfer: Minimal assistance Toilet Transfer Method: Ambulating (RW) Acupuncturist: Acupuncturist: Administrator, arts Method: Designer, industrial/product: Shower seat with back  Therapy/Group: Individual Therapy  Jonquil Stubbe 12/13/2019, 10:48 AM

## 2019-12-13 NOTE — Progress Notes (Signed)
Physical Therapy Session Note  Patient Details  Name: Melanie Baldwin MRN: [id number] Date of Birth: January 25, 1986  Today's Date: 12/13/2019 PT Individual Time: 1015-1100 PT Individual Time Calculation (min): 45 min   Short Term Goals: Week 1:  PT Short Term Goal 1 (Week 1): STGs = LTGs      Skilled Therapeutic Interventions/Progress Updates:    pt received in [facility] and agreeable to therapy. Pt taken to gym in [facility] total A for time and energy. Pt directed in dynamic standing balance activities: on firm surface 4x10 R and L and alternating Bilateral lower extremity with targeted stepping to improve weight shifting, stepping pattern and overall standing balance, CGA; underhand bean bag toss 3x10 without UE support CGA VC for turning head to look at bean bags when reaching for them and to weight shift with tossing; on airex pad pt directed in static standing without UE support 4x30s CGA, progressed to static standing without UE support with very mild perturbations 2x10, frequent LOB mostly posteriorly, VC for increased weight shift anteriorly however limited ability to correct and maintain midline stance without weight shift returning to posterior at static stand. Pt directed in gait training with Rolling walker at 150' CGA + 100' CGA, VC for increased B step height and increased stride length to promote normalized gait pattern, pelvic instability noted during gait with difficulty correcting and controlling. Pt requested to return to bed at end of session, ambulated to bedside, directed in sit>supine SBA. Pt left in bed, alarm set, All needs in reach and in good condition. Call light in hand.    Therapy Documentation Precautions:  Precautions Precautions: Back Precaution Comments: per MD orders, no brace needed Restrictions Weight Bearing Restrictions: No General:   Vital Signs: Therapy Vitals Temp: 97.9 F (36.6 C) Pulse Rate: (!) 110 Resp: 18 BP: 116/73 Patient Position (if appropriate):  Lying Oxygen Therapy SpO2: 97 % O2 Device: Room Air Pain:   Mobility:   Locomotion :    Trunk/Postural Assessment :    Balance:   Exercises:   Other Treatments:      Therapy/Group: Individual Therapy  Barbaraann Faster 12/13/2019, 3:35 PM

## 2019-12-13 NOTE — Progress Notes (Signed)
[facility] PHYSICAL MEDICINE & REHABILITATION PROGRESS NOTE   Subjective/Complaints:   Sensation is the same, but the burning pain is a little better in feet.  Thinks she has a yeast infections- was put on Diflucan.  Also still gets blood with bowel program.  Per staff, and pt, full of stool. Will do soap suds enema and get KUB to see how much need to clean out/might require manual disimpaction.    ROS:   Pt denies SOB, abd pain, CP, N/V and vision changes     Objective:   DG Abd Portable 1V  Result Date: 12/12/2019 CLINICAL DATA:  Constipation EXAM: PORTABLE ABDOMEN - 1 VIEW COMPARISON:  12/07/2019 FINDINGS: Large stool burden throughout the colon. There is a non obstructive bowel gas pattern. No supine evidence of free air. No organomegaly or suspicious calcification. No acute bony abnormality. IMPRESSION: Large stool burden compatible with constipation. Electronically Signed   By: Charlett Nose M.D.   On: 12/12/2019 18:56   No results for input(s): WBC, HGB, HCT, PLT in the last 72 hours. No results for input(s): NA, K, CL, CO2, GLUCOSE, BUN, CREATININE, CALCIUM in the last 72 hours.  Intake/Output Summary (Last 24 hours) at 12/13/2019 1027 Last data filed at 12/13/2019 1018 Gross per 24 hour  Intake 380 ml  Output 2100 ml  Net -1720 ml        Physical Exam: Vital Signs Blood pressure (!) 107/58, pulse 99, temperature 98 F (36.7 C), resp. rate 16, height 5\' 4"  (1.626 m), weight 79.3 kg, SpO2 100 %, unknown if currently breastfeeding. Constitutional: sitting up in bed- appropriate, flat affect, NAD HENT: Normocephalic.  Atraumatic. Eyes: EOMI. No discharge. Cardiovascular: RRR- bordelrine tachycardia- regular rhythm Respiratory: CTA B/L- no W/R/R- good air movement GI: soft, NT, c/o fullness, somewhat distended, hypoactive Skin: Warm and dry.  Incision with dressing CDI Psych: sad affect, flat affect Musc: No edema in extremities.  No tenderness in  extremities. Neuro: Alert Motor: Bilateral upper extremities: 5/5 proximal to distal B/l LE: HF 4+/5, KE 4-4+/5, DF/PF 4+/5 Sensation diminished to light touch b/l LE   Sensation decreased from L3- S2 B/L- as well as S3-S5 B/L  Assessment/Plan: 1. Functional deficits which require 3+ hours per day of interdisciplinary therapy in a comprehensive inpatient rehab setting.  Physiatrist is providing close team supervision and 24 hour management of active medical problems listed below.  Physiatrist and rehab team continue to assess barriers to discharge/monitor patient progress toward functional and medical goals  Care Tool:  Bathing    Body parts bathed by patient: Right arm, Left arm, Chest, Abdomen, Front perineal area, Buttocks, Right upper leg, Left upper leg, Right lower leg, Left lower leg, Face   Body parts bathed by helper: Front perineal area, Buttocks     Bathing assist Assist Level: Contact Guard/Touching assist     Upper Body Dressing/Undressing Upper body dressing   What is the patient wearing?: Pull over shirt    Upper body assist Assist Level: Set up assist    Lower Body Dressing/Undressing Lower body dressing      What is the patient wearing?: Incontinence brief, Pants     Lower body assist Assist for lower body dressing: Minimal Assistance - Patient > 75%     Toileting Toileting Toileting Activity did not occur (Clothing management and hygiene only): N/A (no void or bm)  Toileting assist Assist for toileting: Minimal Assistance - Patient > 75%     Transfers Chair/bed transfer  Transfers assist  Chair/bed transfer assist level: Contact Guard/Touching assist     Locomotion Ambulation   Ambulation assist      Assist level: Contact Guard/Touching assist Assistive device: Walker-rolling Max distance: 200'   Walk 10 feet activity   Assist     Assist level: Contact Guard/Touching assist Assistive device: Walker-rolling   Walk 50  feet activity   Assist    Assist level: Contact Guard/Touching assist Assistive device: Walker-rolling    Walk 150 feet activity   Assist    Assist level: Contact Guard/Touching assist Assistive device: Walker-rolling    Walk 10 feet on uneven surface  activity   Assist     Assist level: Minimal Assistance - Patient > 75% Assistive device: Photographer Will patient use wheelchair at discharge?: No      Wheelchair assist level: Independent      Wheelchair 50 feet with 2 turns activity    Assist            Wheelchair 150 feet activity     Assist          Blood pressure (!) 107/58, pulse 99, temperature 98 F (36.7 C), resp. rate 16, height 5\' 4"  (1.626 m), weight 79.3 kg, SpO2 100 %, unknown if currently breastfeeding.  Medical Problem List and Plan: 1.  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  Continue CIR 2.  Antithrombotics: -DVT/anticoagulation:  Mechanical: Sequential compression devices, below knee Bilateral lower extremities  Lovenox- if not walking, needs 3 months; if walks, needs 2 months             -antiplatelet therapy: N/A 3. Pain Management: Mobic twice daily with hydrocodone as needed.             Monitor with increased exertion for post-operative and neuropathic pain  11/16- denies any pain currently- just numbness- con't regimen as needed  11/17- having some foot pain from walking- sounds like might be having "nerves wake up"- might need nerve pain meds.  11/18- has low dose gabapentin- is helpful, somewhat for nerve Sx's.   4. Mood: LCSW to follow for evaluation and support             -antipsychotic agents: N/A 5. Neuropsych: This patient is capable of making decisions on her own behalf. 6. Skin/Wound Care: Monitor wound for healing.  Routine pressure-relief measures.  Itching- con't benadryl and add eucerin BID 7.  Fluids/Electrolytes/Nutrition: Monitor I/Os.               BMP within acceptable range on 11/13 8.  Fasting hyperglycemia: Likely due to prior steroids and/or IV dextrose--question impaired fasting glucose?               Blood glucose mildly elevated on 11/15 9.  Neurogenic bowel: Reports that she has not had BM for days prior to admission 11/09.   Senna-S 2 tabs changed to daily with suppository at nights.    Multiple bowel movements   11/16- bowel program overnight- con't regimen- will take 4-6 weeks to train bowels  11/17- add Anusol- for hemorrhoids.   11/18- made BID Anusol; will do soap suds enema to get her cleaned out- has a LARGE stool burden on KUB 10. Neurogenic bladder:   Started Floamx 0.4 mg nightly to see if can help pt void.   Foley DC'd on 11/15  11/16- will increase flomax to 0.8 mg qsupper- see if can void but  we discussed watching caths since pt wants to go home independent!   11/17- get pt mirror to watch- spoke to OT  11/18- says mirror is cloudy- asked her ot get husband to get hand mirror from store.  11.  Acute blood loss anemia  Hemoglobin 11.8 on 11/15  Continue to monitor  12. UTI  11/18- U/A (+)- will start Keflex- waiting for Cx' results.   13. Yeast infection  11/18- started Diflucan for yeast infection/vaginal drainage.    LOS: 6 days A FACE TO FACE EVALUATION WAS PERFORMED  Izick Gasbarro 12/13/2019, 10:27 AM

## 2019-12-13 NOTE — Progress Notes (Signed)
Occupational Therapy Session Note  Patient Details  Name: Melanie Baldwin MRN: [id number] Date of Birth: 1985-10-18  Today's Date: 12/13/2019 OT Individual Time: 1345-1430 OT Individual Time Calculation (min): 45 min    Short Term Goals: Week 1:  OT Short Term Goal 1 (Week 1): STGs=LTGs due to ELOS  Skilled Therapeutic Interventions/Progress Updates:    Pt resting in bed upon arrival with husband present. Pt requested to change clothing and completed tasks with supervision. OT intervention with focus on functional amb with RW, standing balance, activity tolerance, and safety awareness to increase independence with BADLs. Pt amb with RW from room to day room and engaged in corn hole X 2. Pt amb with RW to retrieve bean bags from floor with reacher. Pt also retrieved towels from floor, folded them, and transported back to room with RW. Once in roon, pt placed towels in dirty linen bag. Pt returned to room. No LOB or unsafe behaviors noted.  All tasks at supervision and CGA. Pt remained in bed with all needs within reach and bed alarm activated.   Therapy Documentation Precautions:  Precautions Precautions: Back Precaution Comments: per MD orders, no brace needed Restrictions Weight Bearing Restrictions: No Pain:  Pt denies pain this afternoon   Therapy/Group: Individual Therapy  Rich Brave 12/13/2019, 2:38 PM

## 2019-12-14 ENCOUNTER — Inpatient Hospital Stay (HOSPITAL_COMMUNITY): Payer: Medicaid Other | Admitting: Physical Therapy

## 2019-12-14 ENCOUNTER — Inpatient Hospital Stay (HOSPITAL_COMMUNITY): Payer: Medicaid Other

## 2019-12-14 ENCOUNTER — Inpatient Hospital Stay (HOSPITAL_COMMUNITY): Payer: Medicaid Other | Admitting: Occupational Therapy

## 2019-12-14 LAB — URINE CULTURE: Culture: >=100000 — AB

## 2019-12-14 LAB — CBC
HCT: 34.6 % — ABNORMAL LOW (ref 36.0–46.0)
Hemoglobin: 10.8 g/dL — ABNORMAL LOW (ref 12.0–15.0)
MCH: 27.2 pg (ref 26.0–34.0)
MCHC: 31.2 g/dL (ref 30.0–36.0)
MCV: 87.2 fL (ref 80.0–100.0)
Platelets: 188 10*3/uL (ref 150–400)
RBC: 3.97 MIL/uL (ref 3.87–5.11)
RDW: 15.2 % (ref 11.5–15.5)
WBC: 9.9 10*3/uL (ref 4.0–10.5)
nRBC: 0 % (ref 0.0–0.2)

## 2019-12-14 MED ORDER — DULOXETINE HCL 30 MG PO CPEP
30.0000 mg | ORAL_CAPSULE | Freq: Every day | ORAL | Status: DC
  Administered 2019-12-15 – 2019-12-18 (×4): 30 mg via ORAL
  Filled 2019-12-14 (×4): qty 1

## 2019-12-14 MED ORDER — GABAPENTIN 100 MG PO CAPS
100.0000 mg | ORAL_CAPSULE | Freq: Three times a day (TID) | ORAL | Status: DC
  Administered 2019-12-14 – 2019-12-19 (×14): 100 mg via ORAL
  Filled 2019-12-14 (×14): qty 1

## 2019-12-14 MED ORDER — GABAPENTIN 100 MG PO CAPS
100.0000 mg | ORAL_CAPSULE | Freq: Once | ORAL | Status: AC
  Administered 2019-12-14: 100 mg via ORAL
  Filled 2019-12-14: qty 1

## 2019-12-14 MED ORDER — FOSFOMYCIN TROMETHAMINE 3 G PO PACK
3.0000 g | PACK | Freq: Once | ORAL | Status: AC
  Administered 2019-12-14: 3 g via ORAL
  Filled 2019-12-14: qty 3

## 2019-12-14 MED ORDER — MAGNESIUM CITRATE PO SOLN
1.0000 | Freq: Once | ORAL | Status: AC
  Administered 2019-12-15: 1 via ORAL
  Filled 2019-12-14: qty 296

## 2019-12-14 NOTE — Progress Notes (Signed)
Occupational Therapy Session Note  Patient Details  Name: Melanie Baldwin MRN: [id number] Date of Birth: 1985/09/24  Today's Date: 12/14/2019 OT Individual Time: 8088-1103 OT Individual Time Calculation (min): 70 min    Short Term Goals: Week 2:  OT Short Term Goal 1 (Week 2): STGs=LTGs due to ELOS  Skilled Therapeutic Interventions/Progress Updates:    Pt resting in bed upon arrival and agreeable to therapy.  OT intervention with focus on functional amb with RW, dynamic standing balance, BLE therex, activity tolerance, and safety awareness to increase indepenence with BADLs.  Pt completed all tasks with CGA/supervision.  Standing activities included tossing ball to Recreational Therapist, kicking soccer ball to recreational therapist, and several activities at [place]. BLE therex on NuStep 10 mins level 6 and 80 SPM. Pt amb with RW back to room (310') with supervision.  Pt returned to bed and remained in bed with all needs within reach and bed alarm activated.   Therapy Documentation Precautions:  Precautions Precautions: Back Precaution Comments: per MD orders, no brace needed Restrictions Weight Bearing Restrictions: No   Pain: Pain Assessment Pain Scale: 0-10 Pain Score: 4  Pain Type: Acute pain Pain Location: Leg Pain Orientation: Left Pain Descriptors / Indicators: Burning Pain Frequency: Intermittent Pain Onset: On-going Patients Stated Pain Goal: 2 Pain Intervention(s): Repositioned   Therapy/Group: Individual Therapy  Rich Brave 12/14/2019, 12:02 PM

## 2019-12-14 NOTE — Progress Notes (Signed)
Occupational Therapy Weekly Progress Note  Patient Details  Name: Melanie Baldwin MRN: [id number] Date of Birth: 10-20-1985  Beginning of progress report period: December 08, 2019 End of progress report period: December 14, 2019  Pt is making steady progress with BADLs, functional transfers, and functional amb with RW. Pt currently requires CGA for bathing tasks, setup for UB dressing and min A for LB dressing. Pt requires min A for toileting tasks. Functional transfers and functional amb with RW at [facility]. Pt completes simple home mgmt tasks with CGA using RW for amb and balance.   Patient continues to demonstrate the following deficits: muscle weakness and muscle paralysis, decreased cardiorespiratoy endurance, unbalanced muscle activation and decreased coordination and decreased standing balance and decreased balance strategies and therefore will continue to benefit from skilled OT intervention to enhance overall performance with BADL and iADL.  Patient progressing toward long term goals..  Continue plan of care.  OT Short Term Goals Week 1:  OT Short Term Goal 1 (Week 1): STGs=LTGs due to ELOS Week 2:  OT Short Term Goal 1 (Week 2): STGs=LTGs due to ELOS     Rich Brave 12/14/2019, 6:43 AM

## 2019-12-14 NOTE — Evaluation (Signed)
Recreational Therapy Assessment and Plan  Patient Details  Name: Melanie Baldwin MRN: [id number] Date of Birth: 02/14/1985 Today's Date: 12/14/2019  Rehab Potential:  Good ELOS:   d/c 11/24  Assessment  Hospital Problem: Principal Problem:   Cauda equina spinal cord injury [facility])   Past Medical History:      Past Medical History:  Diagnosis Date   Cystic fibrosis carrier    Normal pregnancy 09/24/2010   TB (pulmonary tuberculosis)    took med for 6 months currently negative   Past Surgical History:       Past Surgical History:  Procedure Laterality Date   LUMBAR LAMINECTOMY/DECOMPRESSION MICRODISCECTOMY Bilateral 12/04/2019   Procedure: Bilateral Lumbar Four-Five Microdiscectomy;  Surgeon: Erline Levine, MD;  Location: [place];  Service: Neurosurgery;  Laterality: Bilateral;    Assessment & Plan Clinical Impression: Patient is a 34 y.o. year old female with history LBP that started worsening during pregnancy and had progressive symptoms with pain radiating down BLE and multiple ED visits Sept. prescription for review patient, and husband. She was treated for radiculopathy but continued to worsen till bed bound for days PTA--was waiting insurance clearance for MRI spine. She was admitted on 12/04/2019 with low back pain, urinary incontinence, lower extremity numbness, and inability to stand. MRI lumbar spine showed large extruded disc fragment in midline at L4-L5 with compression of thecal sac and severe spinal stenosis. She was evaluated by Dr. Vertell Limber and found to have loss of rectal tone and saddle anesthesia due to cauda equina syndrome and surgical decompression recommended. She was taken to the OR on 12/04/2019 for bilateral L4--L5 decompression with resection of ruptured disc. Hospital course further complicated by associated paresthesias. Postop has had improvement in BLE but continues to have paresthesias as well as BLE weakness with back precautions affecting ADLs  and mobility. Patient transferred to CIR on 12/07/2019 . Pt referred by team for TR services.  Met with pt today during co-treat with OT to discuss holistic health.  Education included activity analysis/modification and relaxation strategies.  Pt stated understanding and is looking forward to discharge home with her family.  Plan  No further TR as pt is expected to discharge 11/24  Recommendations for other services: None   Discharge Criteria: Patient will be discharged from TR if patient refuses treatment 3 consecutive times without medical reason.  If treatment goals not met, if there is a change in medical status, if patient makes no progress towards goals or if patient is discharged from hospital.  The above assessment, treatment plan, treatment alternatives and goals were discussed and mutually agreed upon: by patient  Amanda 12/14/2019, 12:31 PM

## 2019-12-14 NOTE — Progress Notes (Signed)
[facility] PHYSICAL MEDICINE & REHABILITATION PROGRESS NOTE   Subjective/Complaints:   Pt reports had "water" put in her bottom- but said "not a lot of poop" came out.  No nursing notes written/seen.   Has Flowsheet documented that had 3 small to medium BMs between 8pm and midnight last night- I assume these are due to enema?   Still having burning of LEs L>R.  "pretty bad" per pt.    ROS:   Pt denies SOB, abd pain, CP, N/V/C/D, and vision changes     Objective:   DG Abd Portable 1V  Result Date: 12/12/2019 CLINICAL DATA:  Constipation EXAM: PORTABLE ABDOMEN - 1 VIEW COMPARISON:  12/07/2019 FINDINGS: Large stool burden throughout the colon. There is a non obstructive bowel gas pattern. No supine evidence of free air. No organomegaly or suspicious calcification. No acute bony abnormality. IMPRESSION: Large stool burden compatible with constipation. Electronically Signed   By: Charlett Nose M.D.   On: 12/12/2019 18:56   Recent Labs    12/14/19 0405  WBC 9.9  HGB 10.8*  HCT 34.6*  PLT 188   No results for input(s): NA, K, CL, CO2, GLUCOSE, BUN, CREATININE, CALCIUM in the last 72 hours.  Intake/Output Summary (Last 24 hours) at 12/14/2019 1258 Last data filed at 12/14/2019 0534 Gross per 24 hour  Intake 240 ml  Output 1051 ml  Net -811 ml        Physical Exam: Vital Signs Blood pressure 120/70, pulse 92, temperature 97.8 F (36.6 C), temperature source Oral, resp. rate 20, height 5\' 4"  (1.626 m), weight 75.4 kg, SpO2 100 %, unknown if currently breastfeeding. Constitutional: sitting up in bed; appropriate, sad affect, NAD HENT: Normocephalic.  Atraumatic. Eyes: EOMI. No discharge. Cardiovascular: RRR Respiratory: CTA B/L- no W/R/R- good air movement GI: Soft, NT, ND, (+)BS - appears still full- distended- hypoactive BS Skin: Warm and dry.  Incision with dressing CDI Psych: sad affect Musc: No edema in extremities.  No tenderness in extremities. Neuro: Alert;  Ox3 Motor: Bilateral upper extremities: 5/5 proximal to distal B/l LE: HF 4+/5, KE 4-4+/5, DF/PF 4+/5 Sensation diminished to light touch b/l LE   Sensation decreased from L3- S2 B/L- as well as S3-S5 B/L  Assessment/Plan: 1. Functional deficits which require 3+ hours per day of interdisciplinary therapy in a comprehensive inpatient rehab setting.  Physiatrist is providing close team supervision and 24 hour management of active medical problems listed below.  Physiatrist and rehab team continue to assess barriers to discharge/monitor patient progress toward functional and medical goals  Care Tool:  Bathing    Body parts bathed by patient: Right arm, Left arm, Chest, Abdomen, Front perineal area, Buttocks, Right upper leg, Left upper leg, Right lower leg, Left lower leg, Face   Body parts bathed by helper: Front perineal area, Buttocks     Bathing assist Assist Level: Contact Guard/Touching assist     Upper Body Dressing/Undressing Upper body dressing   What is the patient wearing?: Pull over shirt    Upper body assist Assist Level: Set up assist    Lower Body Dressing/Undressing Lower body dressing      What is the patient wearing?: Incontinence brief, Pants     Lower body assist Assist for lower body dressing: Minimal Assistance - Patient > 75%     Toileting Toileting Toileting Activity did not occur (Clothing management and hygiene only): N/A (no void or bm)  Toileting assist Assist for toileting: Minimal Assistance - Patient > 75%  Transfers Chair/bed transfer  Transfers assist     Chair/bed transfer assist level: Contact Guard/Touching assist     Locomotion Ambulation   Ambulation assist      Assist level: Contact Guard/Touching assist Assistive device: Walker-rolling Max distance: 200'   Walk 10 feet activity   Assist     Assist level: Contact Guard/Touching assist Assistive device: Walker-rolling   Walk 50 feet activity   Assist     Assist level: Contact Guard/Touching assist Assistive device: Walker-rolling    Walk 150 feet activity   Assist    Assist level: Contact Guard/Touching assist Assistive device: Walker-rolling    Walk 10 feet on uneven surface  activity   Assist     Assist level: Minimal Assistance - Patient > 75% Assistive device: Photographer Will patient use wheelchair at discharge?: No      Wheelchair assist level: Independent      Wheelchair 50 feet with 2 turns activity    Assist            Wheelchair 150 feet activity     Assist          Blood pressure 120/70, pulse 92, temperature 97.8 F (36.6 C), temperature source Oral, resp. rate 20, height 5\' 4"  (1.626 m), weight 75.4 kg, SpO2 100 %, unknown if currently breastfeeding.  Medical Problem List and Plan: 1.  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  Continue CIR 2.  Antithrombotics: -DVT/anticoagulation:  Mechanical: Sequential compression devices, below knee Bilateral lower extremities  Lovenox- since she walks, needs 2 months total             -antiplatelet therapy: N/A 3. Pain Management: Mobic twice daily with hydrocodone as needed.             Monitor with increased exertion for post-operative and neuropathic pain  11/16- denies any pain currently- just numbness- con't regimen as needed  11/17- having some foot pain from walking- sounds like might be having "nerves wake up"- might need nerve pain meds.  11/18- has low dose gabapentin- is helpful, somewhat for nerve Sx's.  11/19- will add Duloxetine 30 mg daily for nerve pain- will increase after 3-4 days- make sure she tolerates it.    4. Mood: LCSW to follow for evaluation and support             -antipsychotic agents: N/A 5. Neuropsych: This patient is capable of making decisions on her own behalf. 6. Skin/Wound Care: Monitor wound for  healing.  Routine pressure-relief measures.  Itching- con't benadryl and add eucerin BID 7. Fluids/Electrolytes/Nutrition: Monitor I/Os.               BMP within acceptable range on 11/13 8.  Fasting hyperglycemia: Likely due to prior steroids and/or IV dextrose--question impaired fasting glucose?               Blood glucose mildly elevated on 11/15 9.  Neurogenic bowel: Reports that she has not had BM for days prior to admission 11/09.   Senna-S 2 tabs changed to daily with suppository at nights.    Multiple bowel movements   11/16- bowel program overnight- con't regimen- will take 4-6 weeks to train bowels  11/17- add Anusol- for hemorrhoids.   11/18- made BID Anusol; will do soap suds enema to get her cleaned out- has a LARGE stool burden on KUB  11/19- 3 small BMs last night  and 1 this AM- will recheck KUB to see if has to give Mg citrate or not this weekend  10. Neurogenic bladder:   Started Floamx 0.4 mg nightly to see if can help pt void.   Foley DC'd on 11/15  11/16- will increase flomax to 0.8 mg qsupper- see if can void but we discussed watching caths since pt wants to go home independent!   11/17- get pt mirror to watch- spoke to OT  11/18- says mirror is cloudy- asked her ot get husband to get hand mirror from store.  11/19- no voiding so far- unfortunately.   11.  Acute blood loss anemia  Hemoglobin 11.8 on 11/15  Continue to monitor  12. UTI  11/18- U/A (+)- will start Keflex- waiting for Cx' results.   11/19- ESBL in her urine, so will give Fosfomycin per pharmacy- and stop Keflex.   13. Yeast infection  11/18- started Diflucan for yeast infection/vaginal drainage.    LOS: 7 days A FACE TO FACE EVALUATION WAS PERFORMED  Ammon Muscatello 12/14/2019, 12:58 PM

## 2019-12-14 NOTE — Progress Notes (Signed)
Occupational Therapy Session Note  Patient Details  Name: Melanie Baldwin MRN: [id number] Date of Birth: Aug 03, 1985  Today's Date: 12/14/2019 OT Individual Time: 4859-2763 OT Individual Time Calculation (min): 45 min    Short Term Goals: Week 1:  OT Short Term Goal 1 (Week 1): STGs=LTGs due to ELOS Week 2:  OT Short Term Goal 1 (Week 2): STGs=LTGs due to ELOS  Skilled Therapeutic Interventions/Progress Updates:    Pt received in bed requesting to toilet as she stated her brief was soiled.  Pt ambulated with RW to toilet with CGA (OT removed soiled brief) and sat on toilet.  Pt self cleansed,new brief donned. She walked to sink to wash hands and then sat to do a light sponge bath and dress.  Pt sat at EOB to work on UE exercises with Level 2 theraband focusing on upper back exercises.  Pt said that her spouse purchased a lower bed that is approx. 22 inches.  Ordered her a TTB and BSC through SW. Provided pt with a RW bag.  Pt resting in bed with all needs met and bed alarm set.    Therapy Documentation Precautions:  Precautions Precautions: Back Precaution Comments: per MD orders, no brace needed Restrictions Weight Bearing Restrictions: No      Pain: Pain Assessment Pain Scale: 0-10 Pain Score: 4  Pain Type: Acute pain Pain Location: Leg Pain Orientation: Left Pain Descriptors / Indicators: Burning Pain Frequency: Intermittent Pain Onset: On-going Patients Stated Pain Goal: 2 Pain Intervention(s): Repositioned Multiple Pain Sites: No ADL: ADL Eating: Independent Grooming: Independent Where Assessed-Grooming: Sitting at sink Upper Body Bathing: Setup Where Assessed-Upper Body Bathing: Shower Lower Body Bathing: Contact guard Where Assessed-Lower Body Bathing: Shower Upper Body Dressing: Setup Where Assessed-Upper Body Dressing: Edge of bed Lower Body Dressing: Moderate assistance Where Assessed-Lower Body Dressing: Wheelchair, Sitting at sink, Standing at  sink Toileting: Not assessed Toilet Transfer: Minimal assistance Toilet Transfer Method: Ambulating (RW) Science writer: Energy manager: Curator Method: Heritage manager: Shower seat with back  Therapy/Group: Individual Therapy  Raymer 12/14/2019, 12:11 PM

## 2019-12-14 NOTE — Progress Notes (Signed)
Patient ID: Melanie Baldwin, female   DOB: 08/15/1985, 34 y.o.   MRN: [id number]  Have faxed FLA forms to husband's employer and returned the originals to pt. Have ordered rw, tub bench and bsc

## 2019-12-14 NOTE — Progress Notes (Signed)
Physical Therapy Weekly Progress Note  Patient Details  Name: Melanie Baldwin MRN: [id number] Date of Birth: July 24, 1985  Beginning of progress report period: December 08, 2019 End of progress report period: December 14, 2019  Today's Date: 12/14/2019 PT Individual Time: 1300-1415 PT Individual Time Calculation (min): 75 min   Patient has met 3 of 11 short term goals. Pt has shown progress toward all goals however it not independent with gait, 12 stairs, dynamic standing balance, or transfers at this time. Pt very motivated to participate and improve and continues to demonstrate strong participation with all therapy sessions. Pt limited 2/2 weakness, instability, new need for assistive device and decreased tolerance to activity.  Patient continues to demonstrate the following deficits muscle weakness, decreased cardiorespiratoy endurance, impaired timing and sequencing, unbalanced muscle activation and decreased coordination and decreased standing balance, decreased postural control and decreased balance strategies and therefore will continue to benefit from skilled PT intervention to increase functional independence with mobility.  Patient progressing toward long term goals..  Continue plan of care.  PT Short Term Goals Week 1:  PT Short Term Goal 1 (Week 1): STGs = LTGs Week 2:  PT Short Term Goal 1 (Week 2): STGs = LTGs  Skilled Therapeutic Interventions/Progress Updates:    pt received in bed and agreeable to therapy. Pt directed in supine>sit EOB mod I, Sit to stand to Rolling walker at SBA and gait training for 350' CGA, noted pelvic instability and decreased control with limb advancement compared to baseline per pt, and VC for increased step height and stride length with gait pattern , increased instability with increased step length noted and required min A for balance righting one instance and grossly CGA. Pt required seated rest break at this distance then directed in standing dynamic  balance training at [facility] system for multiple activities with emphasis being reaching in various planes of direction at various reach distances for targets, no external support to improve weight shifting to anterior/posterior and Rt and Lt directions, VC for anterior/posterior weight shift and increased lean with use of Lt UE, grossly CGA. Pt directed in supine core strengthening exercises on mat table: alternating knee to chest within pt's comfort level 2x10 each; then activity with transverse abdominal activation for alternating knee extension 2x10; TA activation with reciprocal knee to chest with contralateral UE raises 2x10. Post rest break pt directed in transfer to [facility] with Rolling walker and returned to room reporting too fatigued to walk. Pt directed in Sit to stand and step transfer to [facility] Rolling walker use CGA and sit>supine mod I. Pt left in bed, alarm set, All needs in reach and in good condition. Call light in hand.    Therapy Documentation Precautions:  Precautions Precautions: Back Precaution Comments: per MD orders, no brace needed Restrictions Weight Bearing Restrictions: No General:   Vital Signs: Therapy Vitals Temp: 98.4 F (36.9 C) Pulse Rate: (!) 109 Resp: 17 BP: 110/79 Patient Position (if appropriate): Lying Oxygen Therapy SpO2: 100 % O2 Device: Room Air Pain:   Vision/Perception     Mobility:   Locomotion :    Trunk/Postural Assessment :    Balance:   Exercises:   Other Treatments:     Therapy/Group: Individual Therapy  Junie Panning 12/14/2019, 3:06 PM

## 2019-12-15 ENCOUNTER — Inpatient Hospital Stay (HOSPITAL_COMMUNITY): Payer: Medicaid Other | Admitting: Physical Therapy

## 2019-12-15 MED ORDER — FLUCONAZOLE 150 MG PO TABS
150.0000 mg | ORAL_TABLET | Freq: Once | ORAL | Status: AC
  Administered 2019-12-15: 150 mg via ORAL
  Filled 2019-12-15: qty 1

## 2019-12-15 NOTE — Progress Notes (Signed)
[facility] PHYSICAL MEDICINE & REHABILITATION PROGRESS NOTE   Subjective/Complaints:  Reviewed KUB, discussed BP with nsg  ROS:   Pt denies SOB, abd pain, CP, N/V/C/D, and vision changes     Objective:   DG Abd 1 View  Result Date: 12/14/2019 CLINICAL DATA:  Constipation.  Delayed colonic transit. EXAM: ABDOMEN - 1 VIEW COMPARISON:  December 12, 2019 FINDINGS: Moderate fecal loading remains, particularly in the ascending colon. The overall amount of fecal load/burden has markedly decreased in the interval. No bowel obstruction. Phleboliths in the right pelvis. IMPRESSION: Marked improvement in previously identified fecal loading. Moderate fecal loading remains in the ascending colon. Electronically Signed   By: Gerome Sam III M.D   On: 12/14/2019 16:13   Recent Labs    12/14/19 0405  WBC 9.9  HGB 10.8*  HCT 34.6*  PLT 188   No results for input(s): NA, K, CL, CO2, GLUCOSE, BUN, CREATININE, CALCIUM in the last 72 hours.  Intake/Output Summary (Last 24 hours) at 12/15/2019 1327 Last data filed at 12/15/2019 1245 Gross per 24 hour  Intake 458 ml  Output 3451 ml  Net -2993 ml        Physical Exam: Vital Signs Blood pressure 98/69, pulse 92, temperature 97.8 F (36.6 C), temperature source Oral, resp. rate 16, height 5\' 4"  (1.626 m), weight 75.4 kg, SpO2 99 %, unknown if currently breastfeeding.  General: No acute distress Mood and affect are appropriate Heart: Regular rate and rhythm no rubs murmurs or extra sounds Lungs: Clear to auscultation, breathing unlabored, no rales or wheezes Abdomen: Positive bowel sounds, soft nontender to palpation, nondistended Extremities: No clubbing, cyanosis, or edema Skin: No evidence of breakdown, no evidence of rash   Neuro: Alert; Ox3 Motor: Bilateral upper extremities: 5/5 proximal to distal B/l LE: HF 4+/5, KE 4-4+/5, DF/PF 4+/5 Sensation diminished to light touch b/l LE   Sensation decreased from L3- S2 B/L- as well  as S3-S5 B/L  Assessment/Plan: 1. Functional deficits which require 3+ hours per day of interdisciplinary therapy in a comprehensive inpatient rehab setting.  Physiatrist is providing close team supervision and 24 hour management of active medical problems listed below.  Physiatrist and rehab team continue to assess barriers to discharge/monitor patient progress toward functional and medical goals  Care Tool:  Bathing    Body parts bathed by patient: Right arm, Left arm, Chest, Abdomen, Front perineal area, Buttocks, Right upper leg, Left upper leg, Right lower leg, Left lower leg, Face   Body parts bathed by helper: Front perineal area, Buttocks     Bathing assist Assist Level: Contact Guard/Touching assist     Upper Body Dressing/Undressing Upper body dressing   What is the patient wearing?: Pull over shirt    Upper body assist Assist Level: Set up assist    Lower Body Dressing/Undressing Lower body dressing      What is the patient wearing?: Incontinence brief, Pants     Lower body assist Assist for lower body dressing: Minimal Assistance - Patient > 75%     Toileting Toileting Toileting Activity did not occur (Clothing management and hygiene only): N/A (no void or bm)  Toileting assist Assist for toileting: Minimal Assistance - Patient > 75%     Transfers Chair/bed transfer  Transfers assist     Chair/bed transfer assist level: Contact Guard/Touching assist     Locomotion Ambulation   Ambulation assist      Assist level: Contact Guard/Touching assist Assistive device: Walker-rolling Max distance: 200'  Walk 10 feet activity   Assist     Assist level: Contact Guard/Touching assist Assistive device: Walker-rolling   Walk 50 feet activity   Assist    Assist level: Contact Guard/Touching assist Assistive device: Walker-rolling    Walk 150 feet activity   Assist    Assist level: Contact Guard/Touching assist Assistive device:  Walker-rolling    Walk 10 feet on uneven surface  activity   Assist     Assist level: Minimal Assistance - Patient > 75% Assistive device: Photographer Will patient use wheelchair at discharge?: No      Wheelchair assist level: Independent      Wheelchair 50 feet with 2 turns activity    Assist            Wheelchair 150 feet activity     Assist          Blood pressure 98/69, pulse 92, temperature 97.8 F (36.6 C), temperature source Oral, resp. rate 16, height 5\' 4"  (1.626 m), weight 75.4 kg, SpO2 99 %, unknown if currently breastfeeding.  Medical Problem List and Plan: 1.  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  Continue CIR 2.  Antithrombotics: -DVT/anticoagulation:  Mechanical: Sequential compression devices, below knee Bilateral lower extremities  Lovenox- since she walks, needs 2 months total             -antiplatelet therapy: N/A 3. Pain Management: Mobic twice daily with hydrocodone as needed.             Monitor with increased exertion for post-operative and neuropathic pain  11/16- denies any pain currently- just numbness- con't regimen as needed  11/17- having some foot pain from walking- sounds like might be having "nerves wake up"- might need nerve pain meds.  11/18- has low dose gabapentin- is helpful, somewhat for nerve Sx's.  11/19- will add Duloxetine 30 mg daily for nerve pain- will increase after 3-4 days- make sure she tolerates it.    4. Mood: LCSW to follow for evaluation and support             -antipsychotic agents: N/A 5. Neuropsych: This patient is capable of making decisions on her own behalf. 6. Skin/Wound Care: Monitor wound for healing.  Routine pressure-relief measures.  Itching- con't benadryl and add eucerin BID 7. Fluids/Electrolytes/Nutrition: Monitor I/Os.               BMP within acceptable range on  11/13 8.  Fasting hyperglycemia: Likely due to prior steroids and/or IV dextrose--question impaired fasting glucose?               Blood glucose mildly elevated on 11/15 9.  Neurogenic bowel: Reports that she has not had BM for days prior to admission 11/09.   Senna-S 2 tabs changed to daily with suppository at nights.    Multiple bowel movements   11/16- bowel program overnight- con't regimen- will take 4-6 weeks to train bowels  11/17- add Anusol- for hemorrhoids.   11/18- made BID Anusol; will do soap suds enema to get her cleaned out- has a LARGE stool burden on KUB  11/19- 3 small BMs last night and 1 this AM- will recheck KUB to see if has to give Mg citrate or not this weekend  11/20 KUB with moderate stool in ascending colon, Mg Citrate after nausea subside (post anti emetic) 10. Neurogenic bladder:   Started Floamx 0.4 mg  nightly to see if can help pt void.   Foley DC'd on 11/15  11/16- will increase flomax to 0.8 mg qsupper- see if can void but we discussed watching caths since pt wants to go home independent!   11/17- get pt mirror to watch- spoke to OT  11/18- says mirror is cloudy- asked her ot get husband to get hand mirror from store.  11/19- no voiding so far- unfortunately.   11.  Acute blood loss anemia  Hemoglobin 11.8 on 11/15, 10.8 11/19, check stool guaic  Continue to monitor  12. UTI  Ucx>100K ecoli, ESBL  11/19- ESBL in her urine, so will give Fosfomycin per pharmacy- and stop Keflex.   13. Yeast infection  11/18- started Diflucan for yeast infection/vaginal drainage.    LOS: 8 days A FACE TO FACE EVALUATION WAS PERFORMED  Erick Colace 12/15/2019, 1:27 PM

## 2019-12-15 NOTE — Progress Notes (Signed)
Physical Therapy Session Note  Patient Details  Name: Shavaun Osterloh MRN: [id number] Date of Birth: January 26, 1986  Today's Date: 12/15/2019 PT Individual Time: 0805-0850 PT Individual Time Calculation (min): 45 min   Short Term Goals: Week 2:  PT Short Term Goal 1 (Week 2): STGs = LTGs  Skilled Therapeutic Interventions/Progress Updates: Pt presented in bed with nsg present changing brief. Once completed pt indicated no pain and agreeable to therapy. Pt performed bed mobility with supervision and demonstrated good understanding of spinal precautions. Pt threaded pants and donned new top with supervision and performed STS with RW to pull pants over hips with supervision. Once completed pt ambulated to sink and performed oral hygiene in standing with distant supervision. Pt then transferred to w/c to transported to rehab gym for time management. Per pt requesting to "work on leg strengthening". Pt participated in following therex at parallel bars. Hip abd/add, hamstring curls, and mini-squats 2 x 01 bilaterally. Pt did require intermittent rests between bouts and tactile cues for correct technique. Pt also performed LAQ and PF with level 4 resistance band 2 x 10 bilaterally. Pt then ambulated back to room with RW and close S without seated rest. Pt returned to bed supervision and left with bed alarm on, call bell within reach and needs met.      Therapy Documentation Precautions:  Precautions Precautions: Back Precaution Comments: per MD orders, no brace needed Restrictions Weight Bearing Restrictions: No General:   Vital Signs: Therapy Vitals Temp: 97.8 F (36.6 C) Temp Source: Oral Pulse Rate: 92 Resp: 16 BP: 98/69 Patient Position (if appropriate): Lying    Therapy/Group: Individual Therapy  Trever Streater  Brenae Lasecki, PTA  12/15/2019, 12:32 PM

## 2019-12-15 NOTE — Progress Notes (Signed)
Dr. Jess Barters made aware of pt complaints of nausea and dizziness, as well of low BP reading.  Will give mag citrate as ordered when nausea subsides

## 2019-12-16 ENCOUNTER — Inpatient Hospital Stay (HOSPITAL_COMMUNITY): Payer: Medicaid Other | Admitting: Physical Therapy

## 2019-12-16 NOTE — Progress Notes (Signed)
Physical Therapy Session Note  Patient Details  Name: Melanie Baldwin MRN: [id number] Date of Birth: Feb 22, 1985  Today's Date: 12/16/2019 PT Individual Time: 9449-6759 PT Individual Time Calculation (min): 44 min   Short Term Goals: Week 2:  PT Short Term Goal 1 (Week 2): STGs = LTGs  Skilled Therapeutic Interventions/Progress Updates:    pt received in bed and agreeable to therapy. Pt directed in supine>sit mod I. Pt directed in Sit to stand from EOB and requested to brush teeth before leaving room, ambulated to sink with Rolling walker supervision and stood at sink supervision with one UE support at walker. Pt then directed in gait training for 450' supervision regressing to CGA with fatigue at end distances with increased pelvic sway and decreased step clearance. Pt directed in seated rest break. Pt directed in seated core strengthening exercises of TA activation/relax with reciprocal hip flexion, increased to reciprcoal hip flexion with alternating UE shoulder flexion as well, 2x10. Pt directed in gait training without AD min A for stability 3x20, increased assist with turns, one LOB noted in posterior direction, mod A to correct. Pt directed in supine core/pelvic strengthening exercises of TA activation/relax with alternating knee extension, ipsilateral hip flexion with shoulder flexion 2x10 each with PT providing manual tactile cues for TA activation and holds with activity, and to relax with rest. Pt directed in supine>sit mod I to EOM and 2x5 Sit to stand without UE support CGA. Pt then directed in gait to return to room 300' CGA. Pt returned to bed at end of session, alarm set, All needs in reach and in good condition. Call light in hand.   Therapy Documentation Precautions:  Precautions Precautions: Back Precaution Comments: per MD orders, no brace needed Restrictions Weight Bearing Restrictions: No General:   Vital Signs:   Pain: Pain Assessment Pain Scale: 0-10 Pain Score: 0-No  pain Patients Stated Pain Goal: 3 Mobility:   Locomotion :    Trunk/Postural Assessment :    Balance:   Exercises:   Other Treatments:      Therapy/Group: Individual Therapy  Barbaraann Faster 12/16/2019, 12:03 PM

## 2019-12-16 NOTE — Progress Notes (Signed)
[place] PHYSICAL MEDICINE & REHABILITATION PROGRESS NOTE   Subjective/Complaints:  Pt concerned about still needing caths as well as bowel issues, good results with Mg Citrate yesterday  LE pain well controlled  Discussed that pt may need to learn self cath  ROS:   Pt denies SOB, abd pain, CP, N/V/C/D, and vision changes     Objective:   DG Abd 1 View  Result Date: 12/14/2019 CLINICAL DATA:  Constipation.  Delayed colonic transit. EXAM: ABDOMEN - 1 VIEW COMPARISON:  December 12, 2019 FINDINGS: Moderate fecal loading remains, particularly in the ascending colon. The overall amount of fecal load/burden has markedly decreased in the interval. No bowel obstruction. Phleboliths in the right pelvis. IMPRESSION: Marked improvement in previously identified fecal loading. Moderate fecal loading remains in the ascending colon. Electronically Signed   By: Gerome Sam III M.D   On: 12/14/2019 16:13   Recent Labs    12/14/19 0405  WBC 9.9  HGB 10.8*  HCT 34.6*  PLT 188   No results for input(s): NA, K, CL, CO2, GLUCOSE, BUN, CREATININE, CALCIUM in the last 72 hours.  Intake/Output Summary (Last 24 hours) at 12/16/2019 1202 Last data filed at 12/16/2019 4098 Gross per 24 hour  Intake 260 ml  Output 3050 ml  Net -2790 ml        Physical Exam: Vital Signs Blood pressure 114/69, pulse 92, temperature 98 F (36.7 C), temperature source Oral, resp. rate 18, height 5\' 4"  (1.626 m), weight 75.4 kg, SpO2 98 %, unknown if currently breastfeeding.   General: No acute distress Mood and affect are appropriate Heart: Regular rate and rhythm no rubs murmurs or extra sounds Lungs: Clear to auscultation, breathing unlabored, no rales or wheezes Abdomen: Positive bowel sounds, soft nontender to palpation, nondistended Extremities: No clubbing, cyanosis, or edema Skin: No evidence of breakdown, no evidence of rash   Neuro: Alert; Ox3 Motor: Bilateral upper extremities: 5/5 proximal  to distal B/l LE: HF 4+/5, KE 4-4+/5, DF/PF 4+/5 Sensation diminished to light touch b/l LE   Sensation decreased from L3- S2 B/L- as well as S3-S5 B/L  Assessment/Plan: 1. Functional deficits which require 3+ hours per day of interdisciplinary therapy in a comprehensive inpatient rehab setting.  Physiatrist is providing close team supervision and 24 hour management of active medical problems listed below.  Physiatrist and rehab team continue to assess barriers to discharge/monitor patient progress toward functional and medical goals  Care Tool:  Bathing    Body parts bathed by patient: Right arm, Left arm, Chest, Abdomen, Front perineal area, Buttocks, Right upper leg, Left upper leg, Right lower leg, Left lower leg, Face   Body parts bathed by helper: Front perineal area, Buttocks     Bathing assist Assist Level: Contact Guard/Touching assist     Upper Body Dressing/Undressing Upper body dressing   What is the patient wearing?: Pull over shirt    Upper body assist Assist Level: Set up assist    Lower Body Dressing/Undressing Lower body dressing      What is the patient wearing?: Incontinence brief, Pants     Lower body assist Assist for lower body dressing: Minimal Assistance - Patient > 75%     Toileting Toileting Toileting Activity did not occur (Clothing management and hygiene only): N/A (no void or bm)  Toileting assist Assist for toileting: Minimal Assistance - Patient > 75%     Transfers Chair/bed transfer  Transfers assist     Chair/bed transfer assist level: Contact Guard/Touching  assist     Locomotion Ambulation   Ambulation assist      Assist level: Contact Guard/Touching assist Assistive device: Walker-rolling Max distance: 200'   Walk 10 feet activity   Assist     Assist level: Contact Guard/Touching assist Assistive device: Walker-rolling   Walk 50 feet activity   Assist    Assist level: Contact Guard/Touching  assist Assistive device: Walker-rolling    Walk 150 feet activity   Assist    Assist level: Contact Guard/Touching assist Assistive device: Walker-rolling    Walk 10 feet on uneven surface  activity   Assist     Assist level: Minimal Assistance - Patient > 75% Assistive device: Photographer Will patient use wheelchair at discharge?: No      Wheelchair assist level: Independent      Wheelchair 50 feet with 2 turns activity    Assist            Wheelchair 150 feet activity     Assist          Blood pressure 114/69, pulse 92, temperature 98 F (36.7 C), temperature source Oral, resp. rate 18, height 5\' 4"  (1.626 m), weight 75.4 kg, SpO2 98 %, unknown if currently breastfeeding.  Medical Problem List and Plan: 1.  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  Continue CIR PT, OT 2.  Antithrombotics: -DVT/anticoagulation:  Mechanical: Sequential compression devices, below knee Bilateral lower extremities  Lovenox- since she walks, needs 2 months total             -antiplatelet therapy: N/A 3. Pain Management: Mobic twice daily with hydrocodone as needed.             Monitor with increased exertion for post-operative and neuropathic pain  11/16- denies any pain currently- just numbness- con't regimen as needed  11/17- having some foot pain from walking- sounds like might be having "nerves wake up"- might need nerve pain meds.  11/18- has low dose gabapentin- is helpful, somewhat for nerve Sx's.  11/19- will add Duloxetine 30 mg daily for nerve pain- will increase after 3-4 days- make sure she tolerates it.    4. Mood: LCSW to follow for evaluation and support             -antipsychotic agents: N/A 5. Neuropsych: This patient is capable of making decisions on her own behalf. 6. Skin/Wound Care: Monitor wound for healing.  Routine pressure-relief  measures.  Itching- con't benadryl and add eucerin BID 7. Fluids/Electrolytes/Nutrition: Monitor I/Os.               BMP within acceptable range on 11/13 8.  Fasting hyperglycemia: Likely due to prior steroids and/or IV dextrose--question impaired fasting glucose?               Blood glucose mildly elevated on 11/15 9.  Neurogenic bowel: Reports that she has not had BM for days prior to admission 11/09.   Senna-S 2 tabs changed to daily with suppository at nights.    Multiple bowel movements   11/16- bowel program overnight- con't regimen- will take 4-6 weeks to train bowels  11/17- add Anusol- for hemorrhoids.   11/18- made BID Anusol; will do soap suds enema to get her cleaned out- has a LARGE stool burden on KUB  11/19- 3 small BMs last night and 1 this AM- will recheck KUB to see if has to give Mg  citrate or not this weekend  11/20 KUB with moderate stool in ascending colon,  11/21 3 BMs after Mg Citrate , has daily dulc supp for bowel program10. Neurogenic bladder:   Started Floamx 0.4 mg nightly to see if can help pt void.   Foley DC'd on 11/15  11/16- will increase flomax to 0.8 mg qsupper-will need to learn ICP 11.  Acute blood loss anemia  Hemoglobin 11.8 on 11/15, 10.8 11/19, check stool guaic  Continue to monitor  12. UTI  Ucx>100K ecoli, ESBL  11/19- ESBL in her urine, so will give Fosfomycin per pharmacy- and stop Keflex.   13. Yeast infection  11/20 repeated diflucan 150mg  for yeast   LOS: 9 days A FACE TO FACE EVALUATION WAS PERFORMED  12/16/2019, 12:02 PM

## 2019-12-17 ENCOUNTER — Inpatient Hospital Stay (HOSPITAL_COMMUNITY): Payer: Medicaid Other | Admitting: Physical Therapy

## 2019-12-17 ENCOUNTER — Inpatient Hospital Stay (HOSPITAL_COMMUNITY): Payer: Medicaid Other | Admitting: Occupational Therapy

## 2019-12-17 ENCOUNTER — Inpatient Hospital Stay (HOSPITAL_COMMUNITY): Payer: Medicaid Other

## 2019-12-17 LAB — BASIC METABOLIC PANEL
Anion gap: 8 (ref 5–15)
BUN: 13 mg/dL (ref 6–20)
CO2: 30 mmol/L (ref 22–32)
Calcium: 9.2 mg/dL (ref 8.9–10.3)
Chloride: 105 mmol/L (ref 98–111)
Creatinine, Ser: 0.57 mg/dL (ref 0.44–1.00)
GFR, Estimated: >60 mL/min (ref >60)
Glucose, Bld: 111 mg/dL — ABNORMAL HIGH (ref 70–99)
Potassium: 4.7 mmol/L (ref 3.5–5.1)
Sodium: 143 mmol/L (ref 135–145)

## 2019-12-17 LAB — CBC
HCT: 37.2 % (ref 36.0–46.0)
Hemoglobin: 11.4 g/dL — ABNORMAL LOW (ref 12.0–15.0)
MCH: 26.9 pg (ref 26.0–34.0)
MCHC: 30.6 g/dL (ref 30.0–36.0)
MCV: 87.7 fL (ref 80.0–100.0)
Platelets: 190 10*3/uL (ref 150–400)
RBC: 4.24 MIL/uL (ref 3.87–5.11)
RDW: 15.4 % (ref 11.5–15.5)
WBC: 7.4 10*3/uL (ref 4.0–10.5)
nRBC: 0 % (ref 0.0–0.2)

## 2019-12-17 MED ORDER — ENOXAPARIN SODIUM 40 MG/0.4ML ~~LOC~~ SOLN
40.0000 mg | Freq: Every day | SUBCUTANEOUS | Status: DC
  Administered 2019-12-17 – 2019-12-24 (×8): 40 mg via SUBCUTANEOUS
  Filled 2019-12-17 (×9): qty 0.4

## 2019-12-17 NOTE — Progress Notes (Signed)
[facility] PHYSICAL MEDICINE & REHABILITATION PROGRESS NOTE   Subjective/Complaints:  Pt and husband at bedside very focused on bowel and bladder issues and return- explained in multiple different ways that due to sensory loss, which is hard to determine prognosis, it's hard to know if she will get back bowel and bladder control.   Pt was able to have 2 BMs on toilet yesterday- nursing note said she voided- pt didn't know/said she didn't.   Says they are also wiping her a lot due to frequent BMs- skin was looking "rough" and bleeding- is looking a little better this AM.   ROS:    Pt denies SOB, abd pain, CP, N/V/C/D, and vision changes     Objective:   No results found. Recent Labs    12/17/19 0630  WBC 7.4  HGB 11.4*  HCT 37.2  PLT 190   Recent Labs    12/17/19 0630  NA 143  K 4.7  CL 105  CO2 30  GLUCOSE 111*  BUN 13  CREATININE 0.57  CALCIUM 9.2    Intake/Output Summary (Last 24 hours) at 12/17/2019 0936 Last data filed at 12/17/2019 1505 Gross per 24 hour  Intake 800 ml  Output 2100 ml  Net -1300 ml        Physical Exam: Vital Signs Blood pressure 106/68, pulse 72, temperature 98.5 F (36.9 C), resp. rate 18, height 5\' 4"  (1.626 m), weight 75.4 kg, SpO2 99 %, unknown if currently breastfeeding.   General: No acute distress; laying in bed- husband at bedside, appropriate, NAD Mood and affect are slightly sad Heart: RRR Lungs: CTA B/L- no W/R/R- good air movement Abdomen: Soft, NT, ND, (+)BS - less full Extremities: No clubbing, cyanosis, or edema Skin: No evidence of breakdown, no evidence of rash Looked at backside- some cream noted between crack and skin somewhat raw- don't see it's bleeding.   Neuro: Alert; Ox3 Motor: Bilateral upper extremities: 5/5 proximal to distal B/l LE: HF 4+/5, KE 4-4+/5, DF/PF 4+/5 Sensation diminished to light touch b/l LE   Sensation decreased from L3- S2 B/L- as well as S3-S5 B/L  Assessment/Plan: 1.  Functional deficits which require 3+ hours per day of interdisciplinary therapy in a comprehensive inpatient rehab setting.  Physiatrist is providing close team supervision and 24 hour management of active medical problems listed below.  Physiatrist and rehab team continue to assess barriers to discharge/monitor patient progress toward functional and medical goals  Care Tool:  Bathing    Body parts bathed by patient: Right arm, Left arm, Chest, Abdomen, Front perineal area, Buttocks, Right upper leg, Left upper leg, Right lower leg, Left lower leg, Face   Body parts bathed by helper: Front perineal area, Buttocks     Bathing assist Assist Level: Contact Guard/Touching assist     Upper Body Dressing/Undressing Upper body dressing   What is the patient wearing?: Pull over shirt    Upper body assist Assist Level: Set up assist    Lower Body Dressing/Undressing Lower body dressing      What is the patient wearing?: Incontinence brief, Pants     Lower body assist Assist for lower body dressing: Minimal Assistance - Patient > 75%     Toileting Toileting Toileting Activity did not occur (Clothing management and hygiene only): N/A (no void or bm)  Toileting assist Assist for toileting: Minimal Assistance - Patient > 75%     Transfers Chair/bed transfer  Transfers assist     Chair/bed transfer assist level:  Contact Guard/Touching assist     Locomotion Ambulation   Ambulation assist      Assist level: Contact Guard/Touching assist Assistive device: Walker-rolling Max distance: 200'   Walk 10 feet activity   Assist     Assist level: Contact Guard/Touching assist Assistive device: Walker-rolling   Walk 50 feet activity   Assist    Assist level: Contact Guard/Touching assist Assistive device: Walker-rolling    Walk 150 feet activity   Assist    Assist level: Contact Guard/Touching assist Assistive device: Walker-rolling    Walk 10 feet on  uneven surface  activity   Assist     Assist level: Minimal Assistance - Patient > 75% Assistive device: Photographer Will patient use wheelchair at discharge?: No      Wheelchair assist level: Independent      Wheelchair 50 feet with 2 turns activity    Assist            Wheelchair 150 feet activity     Assist          Blood pressure 106/68, pulse 72, temperature 98.5 F (36.9 C), resp. rate 18, height 5\' 4"  (1.626 m), weight 75.4 kg, SpO2 99 %, unknown if currently breastfeeding.  Medical Problem List and Plan: 1.  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  11/22- discussed the lack of knowing prognosis with pt and husband- due to not knowing if sensation would come back; explained we had 1 year to know if sensation would come back.   Continue CIR PT, OT 2.  Antithrombotics: -DVT/anticoagulation:  Mechanical: Sequential compression devices, below knee Bilateral lower extremities  Lovenox- since she walks, needs 2 months total  11/22- started Lovenox- increased risk of DVT             -antiplatelet therapy: N/A 3. Pain Management: Mobic twice daily with hydrocodone as needed.             Monitor with increased exertion for post-operative and neuropathic pain  11/16- denies any pain currently- just numbness- con't regimen as needed  11/17- having some foot pain from walking- sounds like might be having "nerves wake up"- might need nerve pain meds.  11/18- has low dose gabapentin- is helpful, somewhat for nerve Sx's.  11/19- will add Duloxetine 30 mg daily for nerve pain- will increase after 3-4 days- make sure she tolerates it.    4. Mood: LCSW to follow for evaluation and support             -antipsychotic agents: N/A 5. Neuropsych: This patient is capable of making decisions on her own behalf. 6. Skin/Wound Care: Monitor wound for healing.  Routine  pressure-relief measures.  Itching- con't benadryl and add eucerin BID 7. Fluids/Electrolytes/Nutrition: Monitor I/Os.               BMP within acceptable range on 11/13 8.  Fasting hyperglycemia: Likely due to prior steroids and/or IV dextrose--question impaired fasting glucose?               Blood glucose mildly elevated on 11/15 9.  Neurogenic bowel: Reports that she has not had BM for days prior to admission 11/09.   Senna-S 2 tabs changed to daily with suppository at nights.    Multiple bowel movements   11/16- bowel program overnight- con't regimen- will take 4-6 weeks to train bowels  11/17- add Anusol- for hemorrhoids.   11/18-  made BID Anusol; will do soap suds enema to get her cleaned out- has a LARGE stool burden on KUB  11/19- 3 small BMs last night and 1 this AM- will recheck KUB to see if has to give Mg citrate or not this weekend  11/20 KUB with moderate stool in ascending colon,  11/21 3 BMs after Mg Citrate , has daily dulc supp for bowel program  11/22- had 2 BMs on toilet- con't bowel program 10. Neurogenic bladder:   Started Floamx 0.4 mg nightly to see if can help pt void.   Foley DC'd on 11/15  11/16- will increase flomax to 0.8 mg   qsupper-will need to learn ICP  11/222- pt waiting/watching; explained needs ot start learning cathing.  11.  Acute blood loss anemia  Hemoglobin 11.8 on 11/15, 10.8 11/19, check stool guaic  Continue to monitor  12. UTI  Ucx>100K ecoli, ESBL  11/19- ESBL in her urine, so will give Fosfomycin per pharmacy- and stop Keflex.   13. Yeast infection  11/20 repeated diflucan 150mg  for yeast   I spent 35 minutes total in room today discussing prognosis- lack of knowing since pt main issue is sensory loss, and B/B tied into this-   LOS: 10 days A FACE TO FACE EVALUATION WAS PERFORMED  Kolsen Choe 12/17/2019, 9:36 AM

## 2019-12-17 NOTE — Progress Notes (Signed)
Physical Therapy Session Note  Patient Details  Name: Melanie Baldwin MRN: [id number] Date of Birth: October 30, 1985  Today's Date: 12/17/2019 PT Individual Time: 1415-1510 PT Individual Time Calculation (min): 55 min   Short Term Goals: Week 2:  PT Short Term Goal 1 (Week 2): STGs = LTGs  Skilled Therapeutic Interventions/Progress Updates:    Pt received seated in bed, agreeable to PT session. No complaints of pain. Pt reports confusion regarding new contact precautions this PM, nursing notified and able to speak with pt regarding precautions at end of session. Pt emotional and tearful regarding lack of communication, provided emotional support. Pt is independent for bed mobility. Pt performs sit to stand transfers to RW at Supervision level throughout session. Ambulation up to 200 ft with RW at Supervision level, ongoing pelvic instability noted. Demonstrated how to perform safe floor transfer and maintain back precautions. Pt interested in performing floor transfer due to wanting to get down on floor to play with her baby as well as for fall recovery. Pt able to follow cues and demonstration and perform floor transfer with min A back up to EOM. Pt reports increase in pain in BLE as well as back following transfer and declines further practice. Pt reports she plans on just holding her baby while seated on couch and does not plan on getting down on floor. Reviewed back precautions with pt having good recall of BLT. Trial gait with no RW but min HHA x 180 ft. Pt exhibits decreased balance with turns and occasional ataxia, with cues to decrease speed on turns pt exhibits improved control and decreased LOB. However, pt would still benefit from use of RW for increased safety and independence with mobility. Static standing balance performing ball toss against rebounder with CGA for balance transitioning to Romberg stance with min A needed for balance, no UE support. Static standing balance on airex, 6 x 30 sec  each with intermittent UE support and CGA for balance. Pt returned to bed at end of session, mod I for bed mobility. Pt left supine in bed with needs in reach, bed alarm in place.  Therapy Documentation Precautions:  Precautions Precautions: Back Precaution Comments: per MD orders, no brace needed Restrictions Weight Bearing Restrictions: No   Therapy/Group: Individual Therapy   Peter Congo, PT, DPT  12/17/2019, 3:19 PM

## 2019-12-17 NOTE — Progress Notes (Signed)
Occupational Therapy Session Note  Patient Details  Name: Melanie Baldwin MRN: [id number] Date of Birth: 07-Feb-1985  Today's Date: 12/17/2019 OT Individual Time: 3009-2330 OT Individual Time Calculation (min): 70 min    Short Term Goals: Week 2:  OT Short Term Goal 1 (Week 2): STGs=LTGs due to ELOS  Skilled Therapeutic Interventions/Progress Updates:    Pt resting in bed upon arrival. OT intervention with focus on functional amb with RW, standing balance, BLE therex, activity tolerance, and safety awareness in preparation for discharge 11/24. Pt amb with RW from room to [employer] (~330') and engaged in [place] activities/tasks-sequencing, targets, gap sequencing, reverse sequencing for a total of 8 tasks with rest breaksX1. Pt amb to EOM and engaged in sit<>stand 8X3 and half squats 8X3. BLE therex on Nustep level 4 for 10 mins. Pt transitioned to nursing station and amb with RW back to room from nursing station. Pt returned to bed. Bed alarm activated and all needs within reach.   Therapy Documentation Precautions:  Precautions Precautions: Back Precaution Comments: per MD orders, no brace needed Restrictions Weight Bearing Restrictions: No   Pain: Pt denies pain this morning Therapy/Group: Individual Therapy  Rich Brave 12/17/2019, 12:05 PM

## 2019-12-17 NOTE — Progress Notes (Addendum)
Slept well throughout the night. Given prn vicodin at hs for left leg pain-effective. I and O cath performed x1. Pt assisted with toileting this am, voided, bladder scan 51.

## 2019-12-17 NOTE — Plan of Care (Signed)
  Problem: Consults Goal: RH SPINAL CORD INJURY PATIENT EDUCATION Description:  See Patient Education module for education specifics.  Outcome: Progressing   Problem: SCI BOWEL ELIMINATION Goal: RH STG MANAGE BOWEL WITH ASSISTANCE Description: STG Manage Bowel with min Assistance. Outcome: Progressing   Problem: RH SKIN INTEGRITY Goal: RH STG MAINTAIN SKIN INTEGRITY WITH ASSISTANCE Description: STG Maintain Skin Integrity With min Assistance. Outcome: Progressing   Problem: RH SAFETY Goal: RH STG ADHERE TO SAFETY PRECAUTIONS W/ASSISTANCE/DEVICE Description: STG Adhere to Safety Precautions With min Assistance/Device. Outcome: Progressing   Problem: RH PAIN MANAGEMENT Goal: RH STG PAIN MANAGED AT OR BELOW PT'S PAIN GOAL Description: <3 on a 0-10 pain scale Outcome: Progressing   Problem: RH KNOWLEDGE DEFICIT SCI Goal: RH STG INCREASE KNOWLEDGE OF SELF CARE AFTER SCI Description: Patient will demonstrate knowledge of medication management, weight bearing precautions, skin care management, and follow up care with the MD post discharge with min assist from CIR staff. Outcome: Progressing

## 2019-12-17 NOTE — Progress Notes (Signed)
Patient ID: Melanie Baldwin, female   DOB: 27-Sep-1985, 34 y.o.   MRN: [id number]  Met with pt to discuss due to her Medicaid this worker can not find a home health agency to take her referral. Will need to feel comfortable with bowel and bladder education prior to discharge, due to will not have [facility] follow up. Stacy-Care Coordinator working on [facility] for home and making sure education is being done. Pt reports she and husband are discussing moving back to [place] where they have family to assist them. That definitely something to think about. Discussed SSD and Medicaid in [place] to re-apply once moves there.

## 2019-12-17 NOTE — Progress Notes (Signed)
Occupational Therapy Session Note  Patient Details  Name: Melanie Baldwin MRN: [id number] Date of Birth: [birth year]  Today's Date: 12/17/2019 OT Individual Time: 6837-2902 OT Individual Time Calculation (min): 70 min    Short Term Goals: Week 2:  OT Short Term Goal 1 (Week 2): STGs=LTGs due to ELOS  Skilled Therapeutic Interventions/Progress Updates:    Pt received in bed with spouse in the room. Spouse was leaving but said they are considering moving in a few months to [place] to be closer to family for more help.   Pt declined a shower, but just wanted to wash up at the sink. Pt completed all self care with supervision.  To focus on LE strength, pt worked on ambulation with RW in hallway. She had 1 minor LOB with min A as her R ankle inverted slightly.  She was then able to complete ambulation to day room with CGA to close S.    In gym, worked on UE strength with weighted dowel bar and hand weights focusing on bicep strength she will need for holding her baby.  Pt is feeling good about her physical progress, she is still concerned about getting improved B and B function.    Pt ambulated back to room, opted to rest in bed.  Pt in room with all needs met, alarm set.     Therapy Documentation Precautions:  Precautions Precautions: Back Precaution Comments: per MD orders, no brace needed Restrictions Weight Bearing Restrictions: No       Pain: Pain Assessment Pain Score: 0-No pain ADL: ADL Eating: Independent Grooming: Independent Where Assessed-Grooming: Sitting at sink Upper Body Bathing: Setup Where Assessed-Upper Body Bathing: Shower Lower Body Bathing: Supervision/safety Where Assessed-Lower Body Bathing: Shower Upper Body Dressing: Setup Where Assessed-Upper Body Dressing: Edge of bed Lower Body Dressing: Supervision/safety Where Assessed-Lower Body Dressing: Wheelchair, Sitting at sink, Standing at sink Toileting: Contact guard Where Assessed-Toileting:  Glass blower/designer: Close supervision Toilet Transfer Method: Ambulating Dietitian) Science writer: Energy manager: Close supervision Social research officer, government Method: Heritage manager: Shower seat with back   Therapy/Group: Individual Therapy  [place] 12/17/2019, 12:22 PM

## 2019-12-18 ENCOUNTER — Inpatient Hospital Stay (HOSPITAL_COMMUNITY): Payer: Medicaid Other

## 2019-12-18 ENCOUNTER — Encounter (HOSPITAL_COMMUNITY): Payer: Medicaid Other | Admitting: Psychology

## 2019-12-18 ENCOUNTER — Inpatient Hospital Stay (HOSPITAL_COMMUNITY): Payer: Medicaid Other | Admitting: Physical Therapy

## 2019-12-18 MED ORDER — SENNA 8.6 MG PO TABS
1.0000 | ORAL_TABLET | Freq: Every day | ORAL | Status: DC
  Administered 2019-12-19 – 2019-12-23 (×5): 8.6 mg via ORAL
  Filled 2019-12-18 (×5): qty 1

## 2019-12-18 MED ORDER — PSYLLIUM 95 % PO PACK
1.0000 | PACK | Freq: Every day | ORAL | Status: DC
  Administered 2019-12-18 – 2019-12-24 (×7): 1 via ORAL
  Filled 2019-12-18 (×7): qty 1

## 2019-12-18 MED ORDER — DULOXETINE HCL 60 MG PO CPEP
60.0000 mg | ORAL_CAPSULE | Freq: Every day | ORAL | Status: DC
  Administered 2019-12-19 – 2019-12-24 (×6): 60 mg via ORAL
  Filled 2019-12-18 (×6): qty 1

## 2019-12-18 MED ORDER — ACETAMINOPHEN 325 MG PO TABS
325.0000 mg | ORAL_TABLET | ORAL | Status: AC | PRN
Start: 2019-12-18 — End: ?

## 2019-12-18 NOTE — Discharge Summary (Signed)
Occupational Therapy Discharge Summary  Patient Details  Name: Koralynn Greenspan MRN: [id number] Date of Birth: [birth year]  Patient has met 9 of 9 long term goals due to improved activity tolerance, improved balance, postural control, ability to compensate for deficits and improved coordination.  Patient to discharge at overall Modified Independent level.  Patient reports that her husband can provide the necessary IADL assistance at discharge.    All goals met.  Recommendation:  No OT f/u recommended  Equipment: BSC + TTB  Reasons for discharge: treatment goals met and discharge from hospital  Patient/family agrees with progress made and goals achieved: Yes  OT Discharge Vision Baseline Vision/History: No visual deficits Patient Visual Report: No change from baseline Vision Assessment?: No apparent visual deficits Perception  Perception: Within Functional Limits Praxis Praxis: Intact Cognition Overall Cognitive Status: Within Functional Limits for tasks assessed Arousal/Alertness: Awake/alert Orientation Level: Oriented X4 Attention: Sustained Focused Attention: Appears intact Sustained Attention: Appears intact Memory: Appears intact Awareness: Appears intact Problem Solving: Appears intact Sensation Sensation Light Touch: Impaired Detail Light Touch Impaired Details: Impaired LLE;Impaired RLE Proprioception: Appears Intact Motor  Motor Motor: Paraplegia Trunk/Postural Assessment  Cervical Assessment Cervical Assessment: Within Functional Limits Thoracic Assessment Thoracic Assessment: Within Functional Limits Lumbar Assessment Lumbar Assessment:  (back precautions) Postural Control Postural Control: Within Functional Limits  Balance Static Sitting Balance Static Sitting - Balance Support: Feet supported;No upper extremity supported Static Sitting - Level of Assistance: 7: Independent Dynamic Sitting Balance Dynamic Sitting - Balance Support: During  functional activity Dynamic Sitting - Level of Assistance: 6: Modified independent (Device/Increase time) Extremity/Trunk Assessment RUE Assessment RUE Assessment: Within Functional Limits LUE Assessment LUE Assessment: Within Functional Limits   Leroy Libman 12/18/2019, 6:48 AM

## 2019-12-18 NOTE — Progress Notes (Addendum)
Physical Therapy Session Note  Patient Details  Name: Melanie Baldwin MRN: [id number] Date of Birth: 1985/02/24  Today's Date: 12/18/2019 PT Individual Time: 0800-0900 PT Individual Time Calculation (min): 60 min   Short Term Goals: Week 1:  PT Short Term Goal 1 (Week 1): STGs = LTGs Week 2:  PT Short Term Goal 1 (Week 2): STGs = LTGs  Skilled Therapeutic Interventions/Progress Updates:    pt received in bed and agreeable to therapy. Pt directed in supine>sit EOB mod I, Sit to stand from EOB to Rolling walker supervision, then requested to stand at sink and complete functional tasks prior to leaving room, 5 steps with Rolling walker to sink supervision, stood at sink with one UE support for teeth brushing, washing face and underarms with wash cloth at setup. Then took sitting rest break, and directed in gait training with Rolling walker for 350' supervision with VC for increased step height with good effect. Pt directed in car transfer, supervision, ascending/descending ramp with Rolling walker supervision and walking on uneven surface with Rolling walker CGA for 10'x2. Pt directed in gait training with Rolling walker to next gym 100' supervision, took seated rest break and directed in ascending/descending 12 stairs with B handrails x2 completed with rest break between sets 2/2 fatigue. Pt then directed in ambulating on compliant surface for 8'x6 with CGA improving to SBA with VC. Pt directed gait to return to room, supervision. Pt directed in BERG balance test with score of 32/56 (less than 45 is indicative of high fall risk). Pt had incontinent BM and required use of restroom, CGA for cleaning at toilet but supervision for donning brief and pants. Pt returned to bed, supervision with Rolling walker and sit>supine mod I. Pt left in bed, alarm set. All needs in reach and in good condition. Call light in hand.    Five times Sit to Stand Test (FTSS) Method: Use a straight back chair with a solid seat  that is 16-18" high. Ask participant to sit on the chair with arms folded across their chest.   Instructions: "Stand up and sit down as quickly as possible 5 times, keeping your arms folded across your chest."   Measurement: Stop timing when the participant stands the 5th time.  TIME: 14.2 (in seconds)  Times > 13.6 seconds is associated with increased disability and morbidity (Guralnik, 2000) Times > 15 seconds is predictive of recurrent falls in healthy individuals aged 47 and older (Buatois, et al., 2008) Normal performance values in community dwelling individuals aged 58 and older (Bohannon, 2006): o 60-69 years: 11.4 seconds o 70-79 years: 12.6 seconds o 80-89 years: 14.8 seconds  MCID: ? 2.3 seconds for Vestibular Disorders (Meretta, 2006)   Therapy Documentation Precautions:  Precautions Precautions: Back Precaution Comments: per MD orders, no brace needed Restrictions Weight Bearing Restrictions: No General:   Vital Signs: Therapy Vitals Temp: 98.1 F (36.7 C) Pulse Rate: (!) 105 Resp: 17 BP: 124/84 Patient Position (if appropriate): Lying Oxygen Therapy SpO2: 95 % O2 Device: Room Air    Therapy/Group: Individual Therapy  Barbaraann Faster 12/18/2019, 3:31 PM

## 2019-12-18 NOTE — Progress Notes (Signed)
Physical Therapy Session Note  Patient Details  Name: Melanie Baldwin MRN: [id number] Date of Birth: 05/21/1985  Today's Date: 12/18/2019 PT Individual Time: 1415-1525 PT Individual Time Calculation (min): 70 min   Short Term Goals: Week 2:  PT Short Term Goal 1 (Week 2): STGs = LTGs  Skilled Therapeutic Interventions/Progress Updates:    Patient received sitting up in bed, agreeable to PT. She denies pain. Patient able to ambulate from room to therapy gym with RW and supervision, ~231ft. She maintains lateral pelvic instability gait pattern. Patient completing the following NMR to assist with pelvic and core stability: -hooklying bridges 3x10 -hooklying bridges U LE on DynaDisc 2x3  Patient asking questions about weakness experienced in distal LE. PT reviewed patients xray/MRI with her and used dermatome/myotome maps to explain various muscle innervations. Patient completing the follow NMR to emphasize ankle strength to assist with balance strategies:  -Ankle AROM on Airex (noted limited PF +eversion)  -attempted ankle 4-ways with theraband- unable d/t weakness - ankle 4- ways 2x12 all directions with no resistance  -standing on foam, EO /EC 3x30s, close spv/CGA Patient only able to remain on foam with eyes closed x6s at a time due to limited proprioception and ankle strategy available. Patient ambulating back to her room with RW and supervision. Returning to bed, bed alarm on, call light within reach.   Therapy Documentation Precautions:  Precautions Precautions: Back Precaution Comments: per MD orders, no brace needed Restrictions Weight Bearing Restrictions: No    Therapy/Group: Individual Therapy  Elizebeth Koller, PT, DPT, CBIS  12/18/2019, 7:47 AM

## 2019-12-18 NOTE — Progress Notes (Signed)
Occupational Therapy Session Note  Patient Details  Name: Melanie Baldwin MRN: [id number] Date of Birth: Jan 25, 1986  Today's Date: 12/18/2019 OT Individual Time: 1015-1055 OT Individual Time Calculation (min): 40 min    Short Term Goals: Week 2:  OT Short Term Goal 1 (Week 2): STGs=LTGs due to ELOS  Skilled Therapeutic Interventions/Progress Updates:    Pt resting in bed upon arrival.  Pt requested changing clothing. Pt doffed/donned shirt and pants with supervision. Pt amb with RW from room to Day Room.  BLE therex on NuStep-5 mins level 5 BLE only; 5 mins level 6 with BUE/BLE. Pt picked up towels from floor while squatting to retrieve. Pt transported towels on RW and placed in linen bag in room.  Pt returned to bed awaiting RN for bladder scan.   Therapy Documentation Precautions:  Precautions Precautions: Back Precaution Comments: per MD orders, no brace needed Restrictions Weight Bearing Restrictions: No   Pain:  Pt c/o tingling in B feet but otherwise no c/o pain   Therapy/Group: Individual Therapy  Rich Brave 12/18/2019, 10:56 AM

## 2019-12-18 NOTE — Progress Notes (Signed)
[facility] PHYSICAL MEDICINE & REHABILITATION PROGRESS NOTE   Subjective/Complaints:  Says feels "more empty".  2 BMs yesterday- on toilet- sometimes.  Legs more heavy this AM/last night- but did a lot of walking yesterday.   Cathed self yesterday x2- - no voiding so far.  Hasn't learned Bowel program yet and keeps trying to say doesn't want to do suppository- "makes her uncomfortable". Explained it's the only way to make her have fewer accidents/stools when she doesn't want to.   Working better on proprioception with PT.   ROS:   Pt denies SOB, abd pain, CP, N/V/C/D, and vision changes     Objective:   No results found. Recent Labs    12/17/19 0630  WBC 7.4  HGB 11.4*  HCT 37.2  PLT 190   Recent Labs    12/17/19 0630  NA 143  K 4.7  CL 105  CO2 30  GLUCOSE 111*  BUN 13  CREATININE 0.57  CALCIUM 9.2    Intake/Output Summary (Last 24 hours) at 12/18/2019 0912 Last data filed at 12/18/2019 0700 Gross per 24 hour  Intake 1160 ml  Output 1100 ml  Net 60 ml        Physical Exam: Vital Signs Blood pressure 126/77, pulse 86, temperature 98.2 F (36.8 C), resp. rate 14, height 5\' 4"  (1.626 m), weight 75.4 kg, SpO2 100 %, unknown if currently breastfeeding.   General:awake, appropriate, sitting up in w/c in gym- working with PT, NAD Mood and affect are slightly sad- cannot fathom the B/B issues still Heart: RRR Lungs: CTA B/L- no W/R/R- good air movement Abdomen: Soft, NT, ND, (+)BS - hypoactive- says "less full".  Extremities: No clubbing, cyanosis, or edema Skin: No evidence of breakdown, no evidence of rash Looked at backside- some cream noted between crack and skin somewhat raw- don't see it's bleeding.   Neuro: Alert; Ox3 Motor: Bilateral upper extremities: 5/5 proximal to distal B/l LE: HF 4+/5, KE 4-4+/5, DF/PF 4+/5 Sensation diminished to light touch b/l LE   Sensation decreased from L3- S2 B/L- as well as S3-S5 B/L  Assessment/Plan: 1.  Functional deficits which require 3+ hours per day of interdisciplinary therapy in a comprehensive inpatient rehab setting.  Physiatrist is providing close team supervision and 24 hour management of active medical problems listed below.  Physiatrist and rehab team continue to assess barriers to discharge/monitor patient progress toward functional and medical goals  Care Tool:  Bathing    Body parts bathed by patient: Right arm, Left arm, Chest, Abdomen, Front perineal area, Buttocks, Right upper leg, Left upper leg, Right lower leg, Left lower leg, Face   Body parts bathed by helper: Front perineal area, Buttocks     Bathing assist Assist Level: Supervision/Verbal cueing     Upper Body Dressing/Undressing Upper body dressing   What is the patient wearing?: Pull over shirt    Upper body assist Assist Level: Set up assist    Lower Body Dressing/Undressing Lower body dressing      What is the patient wearing?: Incontinence brief, Pants     Lower body assist Assist for lower body dressing: Set up assist     Toileting Toileting Toileting Activity did not occur (Clothing management and hygiene only): N/A (no void or bm)  Toileting assist Assist for toileting: Minimal Assistance - Patient > 75%     Transfers Chair/bed transfer  Transfers assist     Chair/bed transfer assist level: Supervision/Verbal cueing     Locomotion Ambulation  Ambulation assist      Assist level: Supervision/Verbal cueing Assistive device: Walker-rolling Max distance: 200'   Walk 10 feet activity   Assist     Assist level: Supervision/Verbal cueing Assistive device: Walker-rolling   Walk 50 feet activity   Assist    Assist level: Supervision/Verbal cueing Assistive device: Walker-rolling    Walk 150 feet activity   Assist    Assist level: Supervision/Verbal cueing Assistive device: Walker-rolling    Walk 10 feet on uneven surface  activity   Assist      Assist level: Minimal Assistance - Patient > 75% Assistive device: Photographer Will patient use wheelchair at discharge?: No      Wheelchair assist level: Independent      Wheelchair 50 feet with 2 turns activity    Assist            Wheelchair 150 feet activity     Assist          Blood pressure 126/77, pulse 86, temperature 98.2 F (36.8 C), resp. rate 14, height 5\' 4"  (1.626 m), weight 75.4 kg, SpO2 100 %, unknown if currently breastfeeding.  Medical Problem List and Plan: 1.  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  11/22- discussed the lack of knowing prognosis with pt and husband- due to not knowing if sensation would come back; explained we had 1 year to know if sensation would come back.   11/23- working on 12/23 with pt in PT  Continue CIR PT, OT 2.  Antithrombotics: -DVT/anticoagulation:  Mechanical: Sequential compression devices, below knee Bilateral lower extremities  Lovenox- since she walks, needs 2 months total  11/22- started Lovenox- increased risk of DVT             -antiplatelet therapy: N/A 3. Pain Management: Mobic twice daily with hydrocodone as needed.             Monitor with increased exertion for post-operative and neuropathic pain  11/16- denies any pain currently- just numbness- con't regimen as needed  11/17- having some foot pain from walking- sounds like might be having "nerves wake up"- might need nerve pain meds.  11/18- has low dose gabapentin- is helpful, somewhat for nerve Sx's.  11/19- will add Duloxetine 30 mg daily for nerve pain- will increase after 3-4 days- make sure she tolerates it.    11/23- will increase Duloxetine to 60 mg daily for nerve pain Sx's as of tomorrow AM  4. Mood: LCSW to follow for evaluation and support             -antipsychotic agents: N/A 5. Neuropsych: This patient  is capable of making decisions on her own behalf. 6. Skin/Wound Care: Monitor wound for healing.  Routine pressure-relief measures.  Itching- con't benadryl and add eucerin BID 7. Fluids/Electrolytes/Nutrition: Monitor I/Os.               BMP within acceptable range on 11/13 8.  Fasting hyperglycemia: Likely due to prior steroids and/or IV dextrose--question impaired fasting glucose?               Blood glucose mildly elevated on 11/15 9.  Neurogenic bowel: Reports that she has not had BM for days prior to admission 11/09. .   11/18- made BID Anusol; will do soap suds enema to get her cleaned out- has a LARGE stool burden on KUB  11/19- 3 small BMs last  night and 1 this AM- will recheck KUB to see if has to give Mg citrate or not this weekend  11/20 KUB with moderate stool in ascending colon,  11/21 3 BMs after Mg Citrate , has daily dulc supp for bowel program  11/22- had 2 BMs on toilet- con't bowel program  11/23- explained has to learn dig stim/suppository- also reduced Senkot-S to Senna 1 tab daily AND added fiber for now.  10. Neurogenic bladder:   Started Floamx 0.4 mg nightly to see if can help pt void.   Foley DC'd on 11/15  11/16- will increase flomax to 0.8 mg   qsupper-will need to learn ICP  11/222- pt waiting/watching; explained needs ot start learning cathing.  11/23- did own caths x2 yesterday- doing better  11.  Acute blood loss anemia  Hemoglobin 11.8 on 11/15, 10.8 11/19, check stool guaic  Continue to monitor  12. UTI  Ucx>100K ecoli, ESBL  11/19- ESBL in her urine, so will give Fosfomycin per pharmacy- and stop Keflex.   11/23- on contact precautions  13. Yeast infection  11/20 repeated diflucan 150mg  for yeast    LOS: 11 days A FACE TO FACE EVALUATION WAS PERFORMED  Melanie Baldwin 12/18/2019, 9:12 AM

## 2019-12-18 NOTE — Patient Care Conference (Signed)
Inpatient RehabilitationTeam Conference and Plan of Care Update Date: 12/18/2019   Time: 11:25 AM    Patient Name: Melanie Baldwin      Medical Record Number: [id number]  Date of Birth: 1985-08-01 Sex: Female         Room/Bed: 4W01C/4W01C-01 Payor Info: Payor: [place] MEDICAID PREPAID HEALTH PLAN / Plan: [place] MEDICAID HEALTHY BLUE / Product Type: *No Product type* /    Admit Date/Time:  12/07/2019  4:11 PM  Primary Diagnosis:  Cauda equina spinal cord injury [facility])  Hospital Problems: Principal Problem:   Cauda equina spinal cord injury (HCC) Active Problems:   Acute blood loss anemia    Expected Discharge Date: Expected Discharge Date: 12/24/19  Team Members Present: Physician leading conference: Dr. Genice Rouge Care Coodinator Present: Kennyth Arnold, RN, BSN, CRRN;Becky Dupree, LCSW Nurse Present: Other (comment) Joycelyn Das, RN) PT Present: Otelia Sergeant, PT OT Present: Ardis Rowan, COTA;Jennifer Smith, OT PPS Coordinator present : Edson Snowball, Park Breed, SLP     Current Status/Progress Goal Weekly Team Focus  Bowel/Bladder   Pt continues with In and Out urinary catheterization Cont/Inc of bowel LBM 11/22  Less incontinent episodes, pt will void  timed toileting /urinary cath per protocol   Swallow/Nutrition/ Hydration             ADL's   excellent progress - Supervision overall  mod I overall  ADL training, functional balance and mobilty, pt/family education   Mobility   Supervision overall with RW  mod I  d/c planning, endurance, LE NMR   Communication             Safety/Cognition/ Behavioral Observations            Pain   Intermittent back pain relieved with Norco prn  Pain <=2/10  assess pain qshift and prn medicate per order   Skin   Surgical incision  free of skin breakdown or infection  assess skin qshift and prn     Discharge Planning:  Pt and husband need to learn I & O cath and feel comfortable with due to can not get home health follow due to  her Medicaid   Team Discussion: Patient performed self-cath, very anxious about suppository. PT report patient wants to go home, doesn't understand bowel program, functionally is progressing well. OT reports patient is close to goal level. MD decreased Senna and added Fiber. Patient on target to meet rehab goals: yes, but extended time to give patient more time to learn the bowel program.  *See Care Plan and progress notes for long and short-term goals.   Revisions to Treatment Plan:  MD decreased Senna and added Fiber. MD request Lovenox education to begin now. Teaching Needs: Continue education on self-caths, bowel program, and begin Lovenox education.  Current Barriers to Discharge: Inaccessible home environment, Decreased caregiver support, Home enviroment access/layout, Neurogenic bowel and bladder, Wound care, Lack of/limited family support, Weight bearing restrictions, Medication compliance and Behavior  Possible Resolutions to Barriers: Continue education on bowel and bladder program, Lovenox education, educate weight bearing precautions. Continue current medications, provide emotional support to patient and family.     Medical Summary Current Status: learning caths- only done x2- hasn't done bowel program; cathing in bed; doesn't appear to understand why doing bowel program.  Barriers to Discharge: Decreased family/caregiver support;Home enviroment access/layout;Incontinence;Neurogenic Bowel & Bladder;Medical stability;Weight bearing restrictions;Wound care  Barriers to Discharge Comments: skin raw on inner buttocks; working on this; anxiety over bowel issues; won't have H/H- no one to  accept her, so needs more time. Possible Resolutions to [employer] Focus: doesn't feel incontinence; therapy- working on proprioception activities; working on higher level balance; motivated- d/c 11/29?   Continued Need for Acute Rehabilitation Level of Care: The patient requires daily medical  management by a physician with specialized training in physical medicine and rehabilitation for the following reasons: Direction of a multidisciplinary physical rehabilitation program to maximize functional independence : Yes Medical management of patient stability for increased activity during participation in an intensive rehabilitation regime.: Yes Analysis of laboratory values and/or radiology reports with any subsequent need for medication adjustment and/or medical intervention. : Yes   I attest that I was present, lead the team conference, and concur with the assessment and plan of the team.   Tennis Must 12/18/2019, 3:45 PM

## 2019-12-18 NOTE — Progress Notes (Signed)
Patient ID: Melanie Baldwin, female   DOB: February 24, 1985, 34 y.o.   MRN: [id number]  Met with pt to make sure aware of team conference progress and discharge extension for education on I & O caths, bowel program and lovenox. She is agreeable after taking with MD and PA and feels it is necessary also. Will order cath for home and be given samples for here for home. Work toward discharge 11/29.

## 2019-12-18 NOTE — Consult Note (Signed)
Neuropsychological Consultation   Patient:   Melanie Baldwin   DOB:   August 08, 1985  MR Number:  [id number]  Location:  [facility] [address] CENTER [id number] [place] [address] Dept: [phone] Loc: [phone]           Date of Service:   12/18/2019  Start Time:   1 PM End Time:   2 PM  Provider/Observer:  Arley Phenix, Psy.D.       Clinical Neuropsychologist       Billing Code/Service: [phone]  Chief Complaint:    Melanie Baldwin is a 34 year old female with history of low blood pressure that started worsening during pregnancy.  The patient had progressive symptoms of pain radiating down bilateral lower extremity with multiple prior ED and urgent care visits with pain complaints.  Patient was treated for radiculopathy but continued to worsen until bed bound 4 days PTA-waiting for insurance clearance for MRI spine.  Patient was admitted on 12/04/2019 for low back pain, urinary incontinence, lower extremity numbness and inability to stand.  MRI lumbar spine showed large extruded disc fragment in the midline at L4-L5 with compression of thecal sac and severe spinal stenosis.  Patient was found to have lack of rectal tone and saddle anesthesia due to cauda equina syndrome and surgical decompression was recommended.  Patient taken to the OR on 12/04/2019 for bilateral L4-L5 decompression with resection of ruptured disc.  Hospital course was further complicated by associated paresthesia.  Patient has continued to have paresthesia as well as bilateral lower extremity weakness with lack of bowel and bladder control.  Reason for Service:  Patient was referred for neuropsychological consultation due to coping and adjustment with residual neurological deficits from spinal cord injury.  Below is the HPI for the current admission.  HPI: Melanie Baldwin is a 34 year old female with history LBP that started worsening during  pregnancy and had progressive symptoms with pain radiating down BLE and multiple ED visits Sept. prescription for review patient, and husband.  She was treated for radiculopathy but continued to worsen till bed bound for days PTA--was waiting insurance clearance for MRI spine.  She was admitted on 12/04/2019 with low back pain, urinary incontinence, lower extremity numbness, and inability to stand.  MRI lumbar spine showed large extruded disc fragment in midline at L4-L5 with compression of thecal sac and severe spinal stenosis.  She was evaluated by Dr. Venetia Maxon and found to have loss of rectal tone and saddle anesthesia due to cauda equina syndrome and surgical decompression recommended.  She was taken to the OR on 12/04/2019 for bilateral L4--L5 decompression with resection of ruptured disc.  Hospital course further complicated by associated paresthesias.  Postop has had improvement in BLE but continues to have paresthesias as well as BLE weakness with back precautions affecting ADLs and mobility.  CIR was recommended due to functional decline.  Please see preadmission assessment from earlier today as well.  Current Status:  Upon entering the room the patient was awake and alert with good mental status.  Expressive receptive language was within normal limits.  Patient was laying in her bed on her left side and did not sit up during the time spent with the patient.  Patient acknowledged concern about her ability to potentially return to work and how she was going to manage taking care of her toddler and newborn.  There has been help at home so far with the patient's mother but the  patient's mother will be leaving on Friday and the patient's husband has to return to work.  Patient is concerned about her ability to return to functional life.  She is improving significantly as far as motor function but continues to have paresthesia.  Patient denies any severe depressive symptomatology but anxiety and reactive depressive  symptoms are endorsed the patient has recently been started on Cymbalta to help with some pain sensations and depressive symptomatology.  She is also taking trazodone at night for sleep.  The patient reports that her pain symptoms have improved significantly but continues to have numbness around her waist and particularly down the back of her legs.  Her biggest concern right now is her worries about how she will return to life both as a mother as well as being able to return back to her job.  Behavioral Observation: Melanie Baldwin  presents as a 34 y.o.-year-old Right Asian Female who appeared her stated age. her dress was Appropriate and she was Well Groomed and her manners were Appropriate to the situation.  her participation was indicative of Appropriate and Attentive behaviors.  There were physical disabilities noted.  she displayed an appropriate level of cooperation and motivation.     Interactions:    Active Appropriate and Attentive  Attention:   within normal limits and attention span and concentration were age appropriate  Memory:   within normal limits; recent and remote memory intact  Visuo-spatial:  within normal limits  Speech (Volume):  low  Speech:   normal; normal  Thought Process:  Coherent and Relevant  Though Content:  WNL; not suicidal and not homicidal  Orientation:   person, place, time/date and situation  Judgment:   Good  Planning:   Fair  Affect:    Anxious  Mood:    Anxious  Insight:   Good  Intelligence:   normal  Medical History:   Past Medical History:  Diagnosis Date  . Cystic fibrosis carrier   . Normal pregnancy 09/24/2010  . TB (pulmonary tuberculosis)    took med for 6 months currently negative       Abuse/Trauma History: Patient has recently had traumatic experience with spinal cord injury and resulting loss of functional capacity.  Psychiatric History:  No prior psychiatric history  Family Med/Psych History:  Family History  Problem  Relation Age of Onset  . Diabetes Paternal Grandmother     Impression/DX:  Melanie Baldwin is a 34 year old female with history of low blood pressure that started worsening during pregnancy.  The patient had progressive symptoms of pain radiating down bilateral lower extremity with multiple prior ED and urgent care visits with pain complaints.  Patient was treated for radiculopathy but continued to worsen until bed bound 4 days PTA-waiting for insurance clearance for MRI spine.  Patient was admitted on 12/04/2019 for low back pain, urinary incontinence, lower extremity numbness and inability to stand.  MRI lumbar spine showed large extruded disc fragment in the midline at L4-L5 with compression of thecal sac and severe spinal stenosis.  Patient was found to have lack of rectal tone and saddle anesthesia due to cauda equina syndrome and surgical decompression was recommended.  Patient taken to the OR on 12/04/2019 for bilateral L4-L5 decompression with resection of ruptured disc.  Hospital course was further complicated by associated paresthesia.  Patient has continued to have paresthesia as well as bilateral lower extremity weakness with lack of bowel and bladder control.  Upon entering the room the patient was awake and alert  with good mental status.  Expressive receptive language was within normal limits.  Patient was laying in her bed on her left side and did not sit up during the time spent with the patient.  Patient acknowledged concern about her ability to potentially return to work and how she was going to manage taking care of her toddler and newborn.  There has been help at home so far with the patient's mother but the patient's mother will be leaving on Friday and the patient's husband has to return to work.  Patient is concerned about her ability to return to functional life.  She is improving significantly as far as motor function but continues to have paresthesia.  Patient denies any severe  depressive symptomatology but anxiety and reactive depressive symptoms are endorsed the patient has recently been started on Cymbalta to help with some pain sensations and depressive symptomatology.  She is also taking trazodone at night for sleep.  The patient reports that her pain symptoms have improved significantly but continues to have numbness around her waist and particularly down the back of her legs.  Her biggest concern right now is her worries about how she will return to life both as a mother as well as being able to return back to her job.  Disposition/Plan:  The patient is having to deal with chronic with significant change in functional capacity.  The patient has been able to start walking some but continues to have paresthesia, pain and reduction in motor strength and function.  Patient continues to have near complete loss of bladder and bowel control which are complicating her level of independence.  The patient has reacted with depressive and anxiety type responses which would be expected given her circumstance and outlook.  The plan at this point is to follow-up with the patient outpatient and referral has been made for her follow-up with me outpatient.  Diagnosis:    Cauda equina spinal cord injury, subsequent encounter - Plan: Ambulatory referral to Physical Medicine Rehab, Ambulatory referral to Psychology  Constipation - Plan: DG Abd Portable 1V, DG Abd Portable 1V, DG Abd Portable 1V, DG Abd Portable 1V  Abdominal pain - Plan: CANCELED: DG Abd 1 View, CANCELED: DG Abd 1 View  Constipation by delayed colonic transit - Plan: DG Abd 1 View, DG Abd 1 View         Electronically Signed   _______________________ Arley Phenix, Psy.D.

## 2019-12-19 ENCOUNTER — Inpatient Hospital Stay (HOSPITAL_COMMUNITY): Payer: Medicaid Other

## 2019-12-19 ENCOUNTER — Inpatient Hospital Stay (HOSPITAL_COMMUNITY): Payer: Medicaid Other | Admitting: Physical Therapy

## 2019-12-19 ENCOUNTER — Inpatient Hospital Stay (HOSPITAL_COMMUNITY): Payer: Medicaid Other | Admitting: Occupational Therapy

## 2019-12-19 MED ORDER — PREGABALIN 75 MG PO CAPS
75.0000 mg | ORAL_CAPSULE | Freq: Two times a day (BID) | ORAL | Status: DC
  Administered 2019-12-19 – 2019-12-24 (×11): 75 mg via ORAL
  Filled 2019-12-19 (×11): qty 1

## 2019-12-19 NOTE — Progress Notes (Signed)
Suppository given @ 1830, dig stim done for 20 seconds, patient tolerated well. Suppository inserted immediately after. Education given to partner for home care for suppository and dig stim education. Will give information to night nurse to continue bowl programing and digital stimulation.   Rayburn Ma, LPN

## 2019-12-19 NOTE — Progress Notes (Addendum)
Physical Therapy Session Note  Patient Details  Name: Melanie Baldwin MRN: [id number] Date of Birth: [birth year]  Today's Date: 12/19/2019 PT Individual Time: 1320-1420 PT Individual Time Calculation (min): 60 min  PT Amount of Missed Time (min): 15 Minutes PT Missed Treatment Reason: Nursing care (I/O cath)  Short Term Goals: Week 2:  PT Short Term Goal 1 (Week 2): STGs = LTGs  Skilled Therapeutic Interventions/Progress Updates:    Attempted to see patient at scheduled therapy time of 1300, pt reports she is waiting for nurse to return to room to assist with I/O cath. This therapist returned after 20 min and nursing finishing assisting pt with I/O cath. Pt then agreeable to PT session. Pt reports some soreness in LLE, premedicated prior to start of therapy session and declines further intervention. Bed mobility mod I with use of bedrail. Sit to stand with Supervision to RW throughout session. Ambulation up to 200 ft with RW at Supervision level. Session focus on BLE strengthening and coordination training. Sidesteps L/R 2 x 10 ft with OTB around ankles and min HHA for balance. Squats x 10 reps, x 15 reps to fatigue with no UE support and CGA for balance. Standing alt L/R forward and lateral step-taps with no UE support and CGA for balance, 2 x 15 reps each. Pt progresses to performing step taps with 4# ankle weights on BLE for increased proprioceptive input as well as muscle challenge, 2 x 15 reps each. Pt exhibits decreased strength in RLE as compared to LLE. Pt then progresses to alt L/R 4" step-ups with initially minimal reliance on RW for steadying to full UE support on RW for balance, CGA, 2 x 10 reps each. Attempt to have pt perform Biodex limits of stability multidirectional weight shifting following visual cues. Pt unable to follow cues correctly to shift weight with hips and keeps arching her back in attempts to shift weight. Activity deferred at this time. Pt returned to bed at end of  session, mod I for bed mobility. Pt left sidelying in bed with needs in reach, bed alarm in place at end of session. Pt missed 15 min total of therapy session today for nursing care.  Therapy Documentation Precautions:  Precautions Precautions: Back Precaution Comments: per MD orders, no brace needed Restrictions Weight Bearing Restrictions: No General: PT Amount of Missed Time (min): 15 Minutes PT Missed Treatment Reason: Nursing care (I/O cath)    Therapy/Group: Individual Therapy   Peter Congo, PT, DPT  12/19/2019, 5:03 PM

## 2019-12-19 NOTE — Progress Notes (Signed)
Recreational Therapy Discharge Summary Patient Details  Name: Melanie Baldwin MRN: [id number] Date of Birth: Sep 26, 1985 Today's Date: 12/19/2019  Long term goals set: 1  Long term goals met: 1  Comments on progress toward goals: Pt session focused on community reintegration prior to discharge home on 11/29.  Pt participated in simulated community mobility within the hospital including the gift shop and crowded hallway, main entrance area.  Pt ambulated with RW with supervision and demonstrated good safety awareness throughout. Pt is ready for discharge home with husband on 11/29.  Goal met.  Reasons goals not met: n/a  Equipment acquired: n/a  Reasons for discharge: treatment goals met  Patient/family agrees with progress made and goals achieved: Yes  [name] 12/19/2019, 3:00 PM

## 2019-12-19 NOTE — Progress Notes (Signed)
Recreational Therapy Session Note  Patient Details  Name: Melanie Baldwin MRN: [id number] Date of Birth: 12-08-1985 Today's Date: 12/19/2019  Pain: no c/o Skilled Therapeutic Interventions/Progress Updates: Session focused on community reintegration during co-treat with OT.  Pt ambulated with RW in hospital based community setting with supervision.  Pt ambulated around various obstacles in crowded areas, practiced various furniture transfers and problem solved through safety concerns and how to transport items.  Pt with questions about caring for her 90 month old baby at home.  Reviewed home modifications to ensure the pt & baby's safety.  Discussed having various areas set up with supplies needed to allow for easy access from a seated position.  Pt stated understanding.    Therapy/Group: Co-Treatment  Vernis Cabacungan 12/19/2019, 2:50 PM

## 2019-12-19 NOTE — Evaluation (Signed)
Recreational Therapy Assessment and Plan  Patient Details  Name: Melanie Baldwin MRN: [id number] Date of Birth: [birth year] Today's Date: 12/19/2019  Rehab Potential:  Good ELOS:   d/c 11/29  Assessment  Hospital Problem:Principal Problem: Cauda equina spinal cord injury [facility])   Past Medical History:     Past Medical History:  Diagnosis Date  . Cystic fibrosis carrier   . Normal pregnancy 09/24/2010  . TB (pulmonary tuberculosis)    took med for 6 months currently negative   Past Surgical History:      Past Surgical History:  Procedure Laterality Date  . LUMBAR LAMINECTOMY/DECOMPRESSION MICRODISCECTOMY Bilateral 12/04/2019   Procedure: Bilateral Lumbar Four-Five Microdiscectomy; Surgeon: Erline Levine, MD; Location: [place]; Service: Neurosurgery; Laterality: Bilateral;    Assessment & Plan Clinical Impression: Patient is a34 y.o.year old femalewith history LBP that started worsening during pregnancy and had progressive symptoms with pain radiating down BLE and multiple ED visits Sept. prescription for review patient, and husband. She was treated for radiculopathy but continued to worsen till bed bound for days PTA--was waiting insurance clearance for MRI spine. She was admitted on 12/04/2019 with low back pain, urinary incontinence, lower extremity numbness, and inability to stand. MRI lumbar spine showed large extruded disc fragment in midline at L4-L5 with compression of thecal sac and severe spinal stenosis. She was evaluated by Dr. Vertell Limber and found to have loss of rectal tone and saddle anesthesia due to cauda equina syndrome and surgical decompression recommended. She was taken to the OR on 12/04/2019 for bilateral L4--L5 decompression with resection of ruptured disc. Hospital course further complicated by associated paresthesias. Postop has had improvement in BLE but continues to have paresthesias as well as BLE weakness with back precautions  affecting ADLs and mobility.Patient [facility] on[facility]12[facility]. Pt referred by team for TR services.  Met with pt 11/19 during co-treat with OT to discuss holistic health.  Education included activity analysis/modification and relaxation strategies.  Pt stated understanding and is looking forward to discharge home with her family.  Pts original discharge date of 11/24 was extended and moved to 11/29.  LRT will schedule a session to focus on community reintegration prior to discharge. Plan 1 TR session >30 minutes for community reintegration during LOS   Recommendations for other services: None   Discharge Criteria: Patient will be discharged from TR if patient refuses treatment 3 consecutive times without medical reason.  If treatment goals not met, if there is a change in medical status, if patient makes no progress towards goals or if patient is discharged from hospital.  The above assessment, treatment plan, treatment alternatives and goals were discussed and mutually agreed upon: by patient  [place] 12/19/2019, 9:44 AM

## 2019-12-19 NOTE — Progress Notes (Signed)
Edu provided for dig stim to pt and spouse. Pt had small Bm, type 5.

## 2019-12-19 NOTE — Progress Notes (Signed)
Occupational Therapy Session Note  Patient Details  Name: Melanie Baldwin MRN: [id number] Date of Birth: April 24, 1985  Today's Date: 12/19/2019 OT Individual Time: 6759-1638 OT Individual Time Calculation (min): 60 min    Short Term Goals: Week 1:  OT Short Term Goal 1 (Week 1): STGs=LTGs due to ELOS Week 2:  OT Short Term Goal 1 (Week 2): STGs=LTGs due to ELOS  Skilled Therapeutic Interventions/Progress Updates:    Pt received in bed ready for therapy. She requested a shower. Pt sat to EOB, stood with RW and ambulated to toilet with close S. Completed toileting with S and one cue to not twist her back as she twisted to reach toilet flush level.  Transferred to shower bench to complete shower with distant S. In shower, she did have bowel leakage. Pt stood to cleanse bottom. She then ambulated to bed to dress with set up.  She then sat at sink to complete oral care and dry her hair.  Pt completed all tasks well with good endurance.  Pt is continuing to work on Dietitian.  Pt resting in wc with all needs met.  Therapy Documentation Precautions:  Precautions Precautions: Back Precaution Comments: per MD orders, no brace needed Restrictions Weight Bearing Restrictions: No       Pain: Pain Assessment Pain Scale: 0-10 Pain Score: 7  Pain Type: Acute pain Pain Intervention(s): Medication (See eMAR) ADL: ADL Eating: Independent Grooming: Independent Where Assessed-Grooming: Sitting at sink Upper Body Bathing: Setup Where Assessed-Upper Body Bathing: Shower Lower Body Bathing: Supervision/safety Where Assessed-Lower Body Bathing: Shower Upper Body Dressing: Setup Where Assessed-Upper Body Dressing: Edge of bed Lower Body Dressing: Supervision/safety Where Assessed-Lower Body Dressing: Wheelchair, Sitting at sink, Standing at sink Toileting: Contact guard Where Assessed-Toileting: Glass blower/designer: Close supervision Toilet Transfer Method: Ambulating Dietitian) Curator: Energy manager: Close supervision Social research officer, government Method: Heritage manager: Shower seat with back   Therapy/Group: Individual Therapy  Sohan Potvin 12/19/2019, 11:59 AM

## 2019-12-19 NOTE — Progress Notes (Signed)
Occupational Therapy Session Note  Patient Details  Name: Melanie Baldwin MRN: [id number] Date of Birth: [birth year]  Today's Date: 12/19/2019 OT Individual Time: 3709-6438 OT Individual Time Calculation (min): 70 min    Short Term Goals: Week 2:  OT Short Term Goal 1 (Week 2): STGs=LTGs due to ELOS  Skilled Therapeutic Interventions/Progress Updates:    Cotreatment with Recreational Therapist. OT intervention with focus on functional amb with RW in community envirionment. Pt amb with RW in gift shop, [employer] connecting hallway, [place]. Pt practiced accessing variety of seating in [facility]. Discussed activity level in home environment when attending to new born and husband is at work. Recommended pt not lift or pick up newborn. Discussed home safety recommendations. Pt verbalized understanding of all recommendations. Pt returned to room and remained in bed with all needs within reach and bed alarm activated.   Therapy Documentation Precautions:  Precautions Precautions: Back Precaution Comments: per MD orders, no brace needed Restrictions Weight Bearing Restrictions: No Pain: Pt denies pain this morning  Therapy/Group: Individual Therapy  Rich Brave 12/19/2019, 12:11 PM

## 2019-12-19 NOTE — Progress Notes (Signed)
[place] PHYSICAL MEDICINE & REHABILITATION PROGRESS NOTE   Subjective/Complaints:  Pt reports had small BM on toilet last night after suppository has fewer bowel accidents yesterday with fiber and reduction in bowel meds.   Also c/o burning in LLE- would like to see if anything else would work, because cannot sleep well due to pain.    ROS:   Pt denies SOB, abd pain, CP, N/V/C/D, and vision changes     Objective:   No results found. Recent Labs    12/17/19 0630  WBC 7.4  HGB 11.4*  HCT 37.2  PLT 190   Recent Labs    12/17/19 0630  NA 143  K 4.7  CL 105  CO2 30  GLUCOSE 111*  BUN 13  CREATININE 0.57  CALCIUM 9.2    Intake/Output Summary (Last 24 hours) at 12/19/2019 0839 Last data filed at 12/19/2019 0600 Gross per 24 hour  Intake 358 ml  Output 2975 ml  Net -2617 ml        Physical Exam: Vital Signs Blood pressure 108/76, pulse 89, temperature 98 F (36.7 C), resp. rate 16, height 5\' 4"  (1.626 m), weight 75.4 kg, SpO2 98 %, unknown if currently breastfeeding.   General:awake, alert, appropriate, still slightly sad affect- less denial, laying in bed, NAD Mood and affect- less sad Heart: RRR Lungs: CTA B/L- no W/R/R- good air movementt Abdomen: Soft, NT, ND, (+)BS  Extremities: No clubbing, cyanosis, or edema Skin: No evidence of breakdown, no evidence of rash Looked at backside- some cream noted between crack and skin somewhat raw-less raw  Neuro: Alert; Ox3 Motor: Bilateral upper extremities: 5/5 proximal to distal B/l LE: HF 4+/5, KE 4-4+/5, DF/PF 4+/5 Sensation diminished to light touch b/l LE   Sensation decreased from L3- S2 B/L- as well as S3-S5 B/L  Assessment/Plan: 1. Functional deficits which require 3+ hours per day of interdisciplinary therapy in a comprehensive inpatient rehab setting.  Physiatrist is providing close team supervision and 24 hour management of active medical problems listed below.  Physiatrist and rehab team  continue to assess barriers to discharge/monitor patient progress toward functional and medical goals  Care Tool:  Bathing    Body parts bathed by patient: Right arm, Left arm, Chest, Abdomen, Front perineal area, Buttocks, Right upper leg, Left upper leg, Right lower leg, Left lower leg, Face   Body parts bathed by helper: Front perineal area, Buttocks     Bathing assist Assist Level: Supervision/Verbal cueing     Upper Body Dressing/Undressing Upper body dressing   What is the patient wearing?: Pull over shirt    Upper body assist Assist Level: Set up assist    Lower Body Dressing/Undressing Lower body dressing      What is the patient wearing?: Incontinence brief, Pants     Lower body assist Assist for lower body dressing: Set up assist     Toileting Toileting Toileting Activity did not occur (Clothing management and hygiene only): N/A (no void or bm)  Toileting assist Assist for toileting: Minimal Assistance - Patient > 75%     Transfers Chair/bed transfer  Transfers assist     Chair/bed transfer assist level: Supervision/Verbal cueing     Locomotion Ambulation   Ambulation assist      Assist level: Supervision/Verbal cueing Assistive device: Walker-rolling Max distance: 200'   Walk 10 feet activity   Assist     Assist level: Supervision/Verbal cueing Assistive device: Walker-rolling   Walk 50 feet activity   Assist  Assist level: Supervision/Verbal cueing Assistive device: Walker-rolling    Walk 150 feet activity   Assist    Assist level: Supervision/Verbal cueing Assistive device: Walker-rolling    Walk 10 feet on uneven surface  activity   Assist     Assist level: Minimal Assistance - Patient > 75% Assistive device: Photographer Will patient use wheelchair at discharge?: No      Wheelchair assist level: Independent      Wheelchair 50 feet with 2 turns  activity    Assist            Wheelchair 150 feet activity     Assist          Blood pressure 108/76, pulse 89, temperature 98 F (36.7 C), resp. rate 16, height 5\' 4"  (1.626 m), weight 75.4 kg, SpO2 98 %, unknown if currently breastfeeding.  Medical Problem List and Plan: 1.  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  11/22- discussed the lack of knowing prognosis with pt and husband- due to not knowing if sensation would come back; explained we had 1 year to know if sensation would come back.   11/23- working on 12/23 with pt in PT  Continue CIR PT, OT 2.  Antithrombotics: -DVT/anticoagulation:  Mechanical: Sequential compression devices, below knee Bilateral lower extremities  Lovenox- since she walks, needs 2 months total  11/22- started Lovenox- increased risk of DVT  11/24- explained to pt will need a TOTAL of 2 months- however can stop after that. Explained why started             -antiplatelet therapy: N/A 3. Pain Management: Mobic twice daily with hydrocodone as needed.             Monitor with increased exertion for post-operative and neuropathic pain  11/16- denies any pain currently- just numbness- con't regimen as needed  11/17- having some foot pain from walking- sounds like might be having "nerves wake up"- might need nerve pain meds.  11/18- has low dose gabapentin- is helpful, somewhat for nerve Sx's.  11/19- will add Duloxetine 30 mg daily for nerve pain- will increase after 3-4 days- make sure she tolerates it.    11/23- will increase Duloxetine to 60 mg daily for nerve pain Sx's as of tomorrow AM   11/24- D/c gabapentin- made sleepy- add Lyrica 75 mg BID for nerve pain 4. Mood: LCSW to follow for evaluation and support             -antipsychotic agents: N/A 5. Neuropsych: This patient is capable of making decisions on her own behalf. 6. Skin/Wound Care:  Monitor wound for healing.  Routine pressure-relief measures.  Itching- con't benadryl and add eucerin BID 7. Fluids/Electrolytes/Nutrition: Monitor I/Os.               BMP within acceptable range on 11/13 8.  Fasting hyperglycemia: Likely due to prior steroids and/or IV dextrose--question impaired fasting glucose?               Blood glucose mildly elevated on 11/15 9.  Neurogenic bowel: Reports that she has not had BM for days prior to admission 11/09. .   11/18- made BID Anusol; will do soap suds enema to get her cleaned out- has a LARGE stool burden on KUB  11/19- 3 small BMs last night and 1 this AM- will recheck KUB to see if has to give Mg  citrate or not this weekend  11/20 KUB with moderate stool in ascending colon,  11/21 3 BMs after Mg Citrate , has daily dulc supp for bowel program  11/22- had 2 BMs on toilet- con't bowel program  11/23- explained has to learn dig stim/suppository- also reduced Senkot-S to Senna 1 tab daily AND added fiber for now.  11/24- said bowels went better! con't regimen for now  10. Neurogenic bladder:   Started Floamx 0.4 mg nightly to see if can help pt void.   Foley DC'd on 11/15  11/16- will increase flomax to 0.8 mg   qsupper-will need to learn ICP  11/222- pt waiting/watching; explained needs ot start learning cathing.  11/23- did own caths x2 yesterday- doing better   11/24- feels much more comfortable about doing own in/out caths 11.  Acute blood loss anemia  Hemoglobin 11.8 on 11/15, 10.8 11/19, check stool guaic  Continue to monitor  12. UTI  Ucx>100K ecoli, ESBL  11/19- ESBL in her urine, so will give Fosfomycin per pharmacy- and stop Keflex.   11/23- on contact precautions  13. Yeast infection  11/20 repeated diflucan 150mg  for yeast    LOS: 12 days A FACE TO FACE EVALUATION WAS PERFORMED  Kasai Beltran 12/19/2019, 8:39 AM

## 2019-12-20 MED ORDER — ENOXAPARIN (LOVENOX) PATIENT EDUCATION KIT
PACK | Freq: Once | Status: AC
  Filled 2019-12-20: qty 1

## 2019-12-20 NOTE — Progress Notes (Signed)
[place] PHYSICAL MEDICINE & REHABILITATION PROGRESS NOTE   Subjective/Complaints:  P Pt said no change in nerve pain so far-  Said didn't get bowel program, after suppository, but has note in computer from evening nurse?  Very homesick- was asking again if could go outside with husband, just to get out of hospital- I agreed- since husband is trained per therapy- will do from 11-2 today.     ROS:   Pt denies SOB, abd pain, CP, N/V/C/D, and vision changes    Objective:   No results found. No results for input(s): WBC, HGB, HCT, PLT in the last 72 hours. No results for input(s): NA, K, CL, CO2, GLUCOSE, BUN, CREATININE, CALCIUM in the last 72 hours.  Intake/Output Summary (Last 24 hours) at 12/20/2019 1000 Last data filed at 12/20/2019 0626 Gross per 24 hour  Intake 480 ml  Output 1480 ml  Net -1000 ml        Physical Exam: Vital Signs Blood pressure 107/75, pulse 85, temperature 98.2 F (36.8 C), temperature source Oral, resp. rate 18, height 5\' 4"  (1.626 m), weight 75.4 kg, SpO2 99 %, unknown if currently breastfeeding.   General:awake, alert, brighter affect once determined she can go outside today, NAD Mood and affect- less sad Heart: RRR Lungs: CTA B/L- no W/R/R- good air movement Abdomen: Soft, NT, ND, (+)BS  Extremities: No clubbing, cyanosis, or edema Skin: No evidence of breakdown, no evidence of rash Looked at backside- some cream noted between crack and skin somewhat raw-less raw  Neuro: Alert; Ox3 Motor: Bilateral upper extremities: 5/5 proximal to distal B/l LE: HF 4+/5, KE 4-4+/5, DF/PF 4+/5 Sensation diminished to light touch b/l LE   Sensation decreased from L3- S2 B/L- as well as S3-S5 B/L  Assessment/Plan: 1. Functional deficits which require 3+ hours per day of interdisciplinary therapy in a comprehensive inpatient rehab setting.  Physiatrist is providing close team supervision and 24 hour management of active medical problems listed  below.  Physiatrist and rehab team continue to assess barriers to discharge/monitor patient progress toward functional and medical goals  Care Tool:  Bathing    Body parts bathed by patient: Right arm, Left arm, Chest, Abdomen, Front perineal area, Buttocks, Right upper leg, Left upper leg, Right lower leg, Left lower leg, Face   Body parts bathed by helper: Front perineal area, Buttocks     Bathing assist Assist Level: Supervision/Verbal cueing     Upper Body Dressing/Undressing Upper body dressing   What is the patient wearing?: Pull over shirt    Upper body assist Assist Level: Set up assist    Lower Body Dressing/Undressing Lower body dressing      What is the patient wearing?: Incontinence brief, Pants     Lower body assist Assist for lower body dressing: Set up assist     Toileting Toileting Toileting Activity did not occur (Clothing management and hygiene only): N/A (no void or bm)  Toileting assist Assist for toileting: Supervision/Verbal cueing     Transfers Chair/bed transfer  Transfers assist     Chair/bed transfer assist level: Supervision/Verbal cueing     Locomotion Ambulation   Ambulation assist      Assist level: Supervision/Verbal cueing Assistive device: Walker-rolling Max distance: 200'   Walk 10 feet activity   Assist     Assist level: Supervision/Verbal cueing Assistive device: Walker-rolling   Walk 50 feet activity   Assist    Assist level: Supervision/Verbal cueing Assistive device: Walker-rolling    Walk 150  feet activity   Assist    Assist level: Supervision/Verbal cueing Assistive device: Walker-rolling    Walk 10 feet on uneven surface  activity   Assist     Assist level: Minimal Assistance - Patient > 75% Assistive device: Photographer Will patient use wheelchair at discharge?: No      Wheelchair assist level: Independent      Wheelchair 50 feet with 2  turns activity    Assist            Wheelchair 150 feet activity     Assist          Blood pressure 107/75, pulse 85, temperature 98.2 F (36.8 C), temperature source Oral, resp. rate 18, height 5\' 4"  (1.626 m), weight 75.4 kg, SpO2 99 %, unknown if currently breastfeeding.  Medical Problem List and Plan: 1.  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  11/22- discussed the lack of knowing prognosis with pt and husband- due to not knowing if sensation would come back; explained we had 1 year to know if sensation would come back.   11/23- working on 12/23 with pt in PT  Continue CIR PT, OT 2.  Antithrombotics: -DVT/anticoagulation:  Mechanical: Sequential compression devices, below knee Bilateral lower extremities  Lovenox- since she walks, needs 2 months total  11/22- started Lovenox- increased risk of DVT  11/24- explained to pt will need a TOTAL of 2 months- however can stop after that. Explained why started  11/25- pt OK with lovenox once explained it             -antiplatelet therapy: N/A 3. Pain Management: Mobic twice daily with hydrocodone as needed.             Monitor with increased exertion for post-operative and neuropathic pain  11/16- denies any pain currently- just numbness- con't regimen as needed  11/17- having some foot pain from walking- sounds like might be having "nerves wake up"- might need nerve pain meds.  11/18- has low dose gabapentin- is helpful, somewhat for nerve Sx's.  11/19- will add Duloxetine 30 mg daily for nerve pain- will increase after 3-4 days- make sure she tolerates it.    11/23- will increase Duloxetine to 60 mg daily for nerve pain Sx's as of tomorrow AM   11/24- D/c gabapentin- made sleepy- add Lyrica 75 mg BID for nerve pain  11/25- no change so far- explained usually takes a few days to kick in.  4. Mood: LCSW to follow for evaluation  and support             -antipsychotic agents: N/A 5. Neuropsych: This patient is capable of making decisions on her own behalf. 6. Skin/Wound Care: Monitor wound for healing.  Routine pressure-relief measures.  Itching- con't benadryl and add eucerin BID 7. Fluids/Electrolytes/Nutrition: Monitor I/Os.               BMP within acceptable range on 11/13 8.  Fasting hyperglycemia: Likely due to prior steroids and/or IV dextrose--question impaired fasting glucose?               Blood glucose mildly elevated on 11/15 9.  Neurogenic bowel: Reports that she has not had BM for days prior to admission 11/09. .   11/18- made BID Anusol; will do soap suds enema to get her cleaned out- has a LARGE stool burden on KUB  11/19- 3 small BMs  last night and 1 this AM- will recheck KUB to see if has to give Mg citrate or not this weekend  11/20 KUB with moderate stool in ascending colon,  11/21 3 BMs after Mg Citrate , has daily dulc supp for bowel program  11/22- had 2 BMs on toilet- con't bowel program  11/23- explained has to learn dig stim/suppository- also reduced Senkot-S to Senna 1 tab daily AND added fiber for now.  11/24- said bowels went better! con't regimen for now   11/25- pt/husband learning bowel program 10. Neurogenic bladder:   Started Floamx 0.4 mg nightly to see if can help pt void.   Foley DC'd on 11/15  11/16- will increase flomax to 0.8 mg   qsupper-will need to learn ICP  11/222- pt waiting/watching; explained needs ot start learning cathing.  11/23- did own caths x2 yesterday- doing better   11/24- feels much more comfortable about doing own in/out caths 11.  Acute blood loss anemia  Hemoglobin 11.8 on 11/15, 10.8 11/19, check stool guaic  Continue to monitor  12. UTI  Ucx>100K ecoli, ESBL  11/19- ESBL in her urine, so will give Fosfomycin per pharmacy- and stop Keflex.   11/23- on contact precautions 13. Yeast infection  11/20 repeated diflucan 150mg  for yeast  11/25- no  more complaints.     LOS: 13 days A FACE TO FACE EVALUATION WAS PERFORMED  Arrington Bencomo 12/20/2019, 10:00 AM

## 2019-12-20 NOTE — Progress Notes (Signed)
1100- pt had two small incontinent BMs at 0820 and 1030. Assessed for impaction. Rectal vault still with some mushy stool. Administered fleet enema followed by dig stim for small BM. Rectal vault clear.   1900- bowel program started at 1800. Pt verbalized feeling nervous and hesitated on doing dig stim at first, but with some encouragement, was able to perform dig stim. Smear of BM with dig stim. Pt then inserted dulcolax suppository. Pt did another dig stim at 1850 for small amount of BM. Rectal vault clear at this time. Pt tolerated well. Pt needs encouragement to perform task and will benefit from further hands on education. Will report off to oncoming staff.   Attempted to educate pt on Lovenox administration. Pt stated that pt's husband will be administering Lovenox and pt refused to self administer Lovenox today. Pt's husband was not present at time of Lovenox administration and unable to have him demonstrate. Provided pt with the Lovenox education kit and reviewed the kit. Pt verbalizes understanding of the need and states she will try her best. Continue plan of care.   Gerald Stabs, RN

## 2019-12-21 ENCOUNTER — Inpatient Hospital Stay (HOSPITAL_COMMUNITY): Payer: Medicaid Other | Admitting: Occupational Therapy

## 2019-12-21 ENCOUNTER — Inpatient Hospital Stay (HOSPITAL_COMMUNITY): Payer: Medicaid Other | Admitting: Physical Therapy

## 2019-12-21 ENCOUNTER — Inpatient Hospital Stay (HOSPITAL_COMMUNITY): Payer: Medicaid Other

## 2019-12-21 LAB — OCCULT BLOOD X 1 CARD TO LAB, STOOL: Fecal Occult Bld: POSITIVE — AB

## 2019-12-21 NOTE — Progress Notes (Signed)
[facility] PHYSICAL MEDICINE & REHABILITATION PROGRESS NOTE   Subjective/Complaints: Patient seen laying in bed this morning.  She states she slept well overnight.  She notes regular bowel/bladder evacuations.  ROS: Denies CP, SOB, N/V/D  Objective:   No results found. No results for input(s): WBC, HGB, HCT, PLT in the last 72 hours. No results for input(s): NA, K, CL, CO2, GLUCOSE, BUN, CREATININE, CALCIUM in the last 72 hours.  Intake/Output Summary (Last 24 hours) at 12/21/2019 1510 Last data filed at 12/21/2019 1300 Gross per 24 hour  Intake 897 ml  Output 1961 ml  Net -1064 ml        Physical Exam: Vital Signs Blood pressure 116/77, pulse 96, temperature 98.3 F (36.8 C), resp. rate 18, height 5\' 4"  (1.626 m), weight 75.4 kg, SpO2 98 %, unknown if currently breastfeeding. Constitutional: No distress . Vital signs reviewed. HENT: Normocephalic.  Atraumatic. Eyes: EOMI. No discharge. Cardiovascular: No JVD.  RRR. Respiratory: Normal effort.  No stridor.  Bilateral clear to auscultation. GI: Non-distended.  BS +. Skin: Warm and dry.  Intact. Psych: Normal mood.  Normal behavior. Musc: No edema in extremities.  No tenderness in extremities. Neuro: Alert Motor: Bilateral upper extremities: 5/5 proximal to distal B/l LE: 5/5 proximal distal Sensation diminished to light touch b/l LE, unchanged  Assessment/Plan: 1. Functional deficits which require 3+ hours per day of interdisciplinary therapy in a comprehensive inpatient rehab setting.  Physiatrist is providing close team supervision and 24 hour management of active medical problems listed below.  Physiatrist and rehab team continue to assess barriers to discharge/monitor patient progress toward functional and medical goals  Care Tool:  Bathing    Body parts bathed by patient: Right arm, Left arm, Chest, Abdomen, Front perineal area, Buttocks, Right upper leg, Left upper leg, Right lower leg, Left lower leg, Face    Body parts bathed by helper: Front perineal area, Buttocks     Bathing assist Assist Level: Set up assist     Upper Body Dressing/Undressing Upper body dressing   What is the patient wearing?: Pull over shirt    Upper body assist Assist Level: Independent with assistive device    Lower Body Dressing/Undressing Lower body dressing      What is the patient wearing?: Incontinence brief, Pants     Lower body assist Assist for lower body dressing: Independent with assitive device     Toileting Toileting Toileting Activity did not occur (Clothing management and hygiene only): N/A (no void or bm)  Toileting assist Assist for toileting: Independent with assistive device     Transfers Chair/bed transfer  Transfers assist     Chair/bed transfer assist level: Supervision/Verbal cueing Chair/bed transfer assistive device: Walker,   Ambulation assist      Assist level: Supervision/Verbal cueing Assistive device: Walker-rolling Max distance: 175'   Walk 10 feet activity   Assist     Assist level: Supervision/Verbal cueing Assistive device: Walker-rolling   Walk 50 feet activity   Assist    Assist level: Supervision/Verbal cueing Assistive device: Walker-rolling    Walk 150 feet activity   Assist    Assist level: Supervision/Verbal cueing Assistive device: Walker-rolling    Walk 10 feet on uneven surface  activity   Assist     Assist level: Minimal Assistance - Patient > 75% Assistive device: Archivist Will patient use wheelchair at discharge?: No      Wheelchair assist level:  Independent      Wheelchair 50 feet with 2 turns activity    Assist            Wheelchair 150 feet activity     Assist          Medical Problem List and Plan: 1.  Paraparesis,paresthesias, limitations in mobility and self-care secondary to paraparesis due to cauda equina  syndrome.  Continue CIR 2.  Antithrombotics: -DVT/anticoagulation:  Mechanical: Sequential compression devices, below knee Bilateral lower extremities  Lovenox -antiplatelet therapy: N/A 3. Pain Management: Mobic twice daily with hydrocodone as needed.             Monitor with increased exertion for post-operative and neuropathic pain  Duloxetine 60 mg daily on 11/25  Lyrica 75 mg BID for nerve pain 4. Mood: LCSW to follow for evaluation and support             -antipsychotic agents: N/A 5. Neuropsych: This patient is capable of making decisions on her own behalf. 6. Skin/Wound Care: Monitor wound for healing.  Routine pressure-relief measures.  Itching- con't benadryl and add eucerin BID 7. Fluids/Electrolytes/Nutrition: Monitor I/Os.               BMP within acceptable range on 11/22 8.  Fasting hyperglycemia: Likely due to prior steroids and/or IV dextrose--question impaired fasting glucose?               Blood glucose mildly elevated on 11/22 9.  Neurogenic bowel:   Explained has to learn dig stim/suppository- also reduced Senkot-S to Senna 1 tab daily AND added fiber for now. 10. Neurogenic bladder:   Foley DC'd on 11/15  Increased flomax to 0.8 mg  Continue I/O caths 11.  Acute blood loss anemia  Hemoglobin 11.4 on 11/22   Continue to monitor  12. UTI  Ucx>100K ecoli, ESBL  11/19- ESBL in her urine, so will give Fosfomycin per pharmacy- and stop Keflex.   11/23- on contact precautions 13. Yeast infection: Resolved after treatment  LOS: 14 days A FACE TO FACE EVALUATION WAS PERFORMED  [name] Karis Juba 12/21/2019, 3:10 PM

## 2019-12-21 NOTE — Progress Notes (Signed)
Patient ID: Melanie Baldwin, female   DOB: July 03, 1985, 34 y.o.   MRN: [id number]  This SW covering for primary SW, [place].  SW met with pt in room to provide sample catheters. DME delivered to room: RW and 3in1 BSC. Pt states she was told they were out of stock for TTB and the item will be shipped to her home. SW received updates from [facility] indicating TTB will be delivered to her room. *SW confirmed item delivered to room.   Loralee Pacas, MSW, [place] Office: [phone] Cell: [phone] Fax: [phone]

## 2019-12-21 NOTE — Progress Notes (Signed)
Physical Therapy Session Note  Patient Details  Name: Melanie Baldwin MRN: [id number] Date of Birth: May 01, 1985  Today's Date: 12/21/2019 PT Individual Time: 1505-1600 PT Individual Time Calculation (min): 55 min   Short Term Goals: Week 2:  PT Short Term Goal 1 (Week 2): STGs = LTGs  Skilled Therapeutic Interventions/Progress Updates:    Pt received seated in bed, agreeable to PT session. Pt reports she has been having pain in LLE but it is much improved since starting a new medication. Bed mobility independent. Sit to stand with Supervision to RW throughout session. Ambulation up to 175 ft with RW at Supervision level. Ascend/descend 12 x 6" stairs with 2 handrails and CGA for balance, step-through gait pattern. Remainder of session focus on creating HEP for patient based on LE strength deficits. Supine to/from sit on flat mat table independently. Supine bridges, SLR, hip abd with green theraband, SKFO within limited range due to back precautions; seated HS curls with green theraband, resisted ankle DF with orange theraband for RLE and green for LLE, resisted ankle PF with orange theraband for LLE and gravity for RLE, R ankle AROM eversion; standing mini-squats. Pt demos good understanding and performance of exercises, will provide handout. Pt returned to bed at end of session, needs in reach, bed alarm in place.  Therapy Documentation Precautions:  Precautions Precautions: Back, Fall Precaution Comments: per MD orders, no brace needed Restrictions Weight Bearing Restrictions: No   Therapy/Group: Individual Therapy   Peter Congo, PT, DPT  12/21/2019, 5:21 PM

## 2019-12-21 NOTE — Progress Notes (Signed)
1830 Patient preformed dig stim, with success of cleaning out BM, patient had a small BM type 4 before suppository insertion. She is feeling more comfortable with Dig stim and suppository. Will report too oncoming staff.   Rayburn Ma, LPN

## 2019-12-21 NOTE — Progress Notes (Addendum)
Pt with medium soft brown medium stool after bowel protocol

## 2019-12-21 NOTE — Progress Notes (Signed)
Occupational Therapy Session Note  Patient Details  Name: Chanay Nugent MRN: [id number] Date of Birth: 22-Aug-1985  Today's Date: 12/21/2019 OT Individual Time: 0109-3235 OT Individual Time Calculation (min): 55 min    Short Term Goals: Week 1:  OT Short Term Goal 1 (Week 1): STGs=LTGs due to ELOS  Skilled Therapeutic Interventions/Progress Updates:    1;1. Pt received in bed agreeable to OT requesting to alert RN to deliver shot/medications. Pt completes all mobility with S overall using RW and bed rails for bed mobility. Pt completes toileting/transfer with MOD I at ambulatory level and requires increased time on toilet for small BM (small incontinent BM in brief). Pt transfers to [facility] in shower with set up and bathes with set up at sit to stand. Dressing completed at MOD I sit to stand. Pt reporting will use pull ups at home to improve ease of LB dressing. Pt dries hair with hairdryer for BUE strengthening and endurance required for transfers/ADLs seated for energy conservation. Exited session with pt seated in w/c with safety alarm on and call light in reach to ask for assistance back to bed.   Therapy Documentation Precautions:  Precautions Precautions: Back Precaution Comments: per MD orders, no brace needed Restrictions Weight Bearing Restrictions: No General:   Vital Signs: Therapy Vitals Temp: 98.1 F (36.7 C) Temp Source: Oral Pulse Rate: 93 Resp: 16 BP: 109/70 Patient Position (if appropriate): Lying Oxygen Therapy SpO2: 99 % O2 Device: Room Air Pain:   ADL: ADL Eating: Independent Grooming: Independent Where Assessed-Grooming: Sitting at sink Upper Body Bathing: Setup Where Assessed-Upper Body Bathing: Shower Lower Body Bathing: Supervision/safety Where Assessed-Lower Body Bathing: Shower Upper Body Dressing: Setup Where Assessed-Upper Body Dressing: Edge of bed Lower Body Dressing: Supervision/safety Where Assessed-Lower Body Dressing: Wheelchair,  Sitting at sink, Standing at sink Toileting: Contact guard Where Assessed-Toileting: Teacher, adult education: Close supervision Statistician Method: Ambulating Psychologist, educational) Acupuncturist: Acupuncturist: Close supervision Film/video editor Method: Designer, industrial/product: Information systems manager with back Clinical research associate   Exercises:   Other Treatments:     Therapy/Group: Individual Therapy  Shon Hale 12/21/2019, 6:41 AM

## 2019-12-21 NOTE — Progress Notes (Signed)
Occupational Therapy Session Note  Patient Details  Name: Melanie Baldwin MRN: [id number] Date of Birth: Nov 12, 1985  Today's Date: 12/21/2019 OT Individual Time: 3846-6599 OT Individual Time Calculation (min): 68 min    Short Term Goals: Week 2:  OT Short Term Goal 1 (Week 2): STGs=LTGs due to ELOS  Skilled Therapeutic Interventions/Progress Updates:    Pt sitting on the bed to start session ready to begin therapy.  She was able to doff her gripper socks and then donn her regular socks and shoes with setup assist.  She completed sit to stand with supervision throughout session with min instructional cueing to push up from the surface with at least one hand.  She washed her hands at the sink and then ambulated down to the dayroom with supervision using the RW.  She was able to work on static standing balance with use of the Wii.  Began with attempted use of the Wii Balance Board, but pt exhibits decreased forward weightshift to the balls of her feet and tends to keep her weight posteriorly with increased fear of transitioning forward. She had to have max facilitation to shift her weight forward onto the balls of her feet and states she feels like her legs are going to give out.  After several attempts, transitioned to use of the just the Wii controller in the right hand while standing.  She needed support of the RW with the left hand while interacting with the game.  Therapist placed wedge under her heels to help promote forward weightshift.  She was able to stand with min guard assist but relies heavily on the RW for support with the left hand.  Standing intervals of 3-5 mins without significant endurance issues were noted.  Finished session with transition back to the room and pt left with the call button and phone in reach and pt resting in bed per her request.  Bed alarm turned on as well.   Therapy Documentation Precautions:  Precautions Precautions: Back, Fall Precaution Comments: per MD  orders, no brace needed Restrictions Weight Bearing Restrictions: No   Pain: Pain Assessment Pain Scale: 0-10 Pain Score: 0-No pain ADL: See Care Tool Section for some details of mobility and selfcare  Therapy/Group: Individual Therapy  Elsie Sakuma OTR/L 12/21/2019, 12:15 PM

## 2019-12-22 DIAGNOSIS — M792 Neuralgia and neuritis, unspecified: Secondary | ICD-10-CM

## 2019-12-22 DIAGNOSIS — K5901 Slow transit constipation: Secondary | ICD-10-CM

## 2019-12-22 NOTE — Progress Notes (Signed)
[facility] PHYSICAL MEDICINE & REHABILITATION PROGRESS NOTE   Subjective/Complaints: Patient seen sitting up in bed this morning.  She states she slept well overnight.  Notified by nursing overnight regarding positive Hemoccult sample.  ROS: Denies CP, SOB, N/V/D  Objective:   No results found. No results for input(s): WBC, HGB, HCT, PLT in the last 72 hours. No results for input(s): NA, K, CL, CO2, GLUCOSE, BUN, CREATININE, CALCIUM in the last 72 hours.  Intake/Output Summary (Last 24 hours) at 12/22/2019 1128 Last data filed at 12/22/2019 0600 Gross per 24 hour  Intake 358 ml  Output 1900 ml  Net -1542 ml        Physical Exam: Vital Signs Blood pressure 115/67, pulse 73, temperature 98 F (36.7 C), resp. rate 16, height 5\' 4"  (1.626 m), weight 75.4 kg, SpO2 99 %, unknown if currently breastfeeding. Constitutional: No distress . Vital signs reviewed. HENT: Normocephalic.  Atraumatic. Eyes: EOMI. No discharge. Cardiovascular: No JVD.  RRR. Respiratory: Normal effort.  No stridor.  Bilateral clear to auscultation. GI: Non-distended.  BS +. Skin: Warm and dry.  Intact. Psych: Normal mood.  Normal behavior. Musc: No edema in extremities.  No tenderness in extremities. Neuro: Alert Motor: Bilateral upper extremities: 5/5 proximal to distal B/l LE: 5/5 proximal distal Sensation diminished to light touch b/l LE, stable  Assessment/Plan: 1. Functional deficits which require 3+ hours per day of interdisciplinary therapy in a comprehensive inpatient rehab setting.  Physiatrist is providing close team supervision and 24 hour management of active medical problems listed below.  Physiatrist and rehab team continue to assess barriers to discharge/monitor patient progress toward functional and medical goals  Care Tool:  Bathing    Body parts bathed by patient: Right arm, Left arm, Chest, Abdomen, Front perineal area, Buttocks, Right upper leg, Left upper leg, Right lower leg,  Left lower leg, Face   Body parts bathed by helper: Front perineal area, Buttocks     Bathing assist Assist Level: Set up assist     Upper Body Dressing/Undressing Upper body dressing   What is the patient wearing?: Pull over shirt    Upper body assist Assist Level: Independent with assistive device    Lower Body Dressing/Undressing Lower body dressing      What is the patient wearing?: Incontinence brief, Pants     Lower body assist Assist for lower body dressing: Independent with assitive device     Toileting Toileting Toileting Activity did not occur (Clothing management and hygiene only): N/A (no void or bm)  Toileting assist Assist for toileting: Independent with assistive device     Transfers Chair/bed transfer  Transfers assist     Chair/bed transfer assist level: Supervision/Verbal cueing Chair/bed transfer assistive device:   Ambulation assist      Assist level: Supervision/Verbal cueing Assistive device: Walker-rolling Max distance: 175'   Walk 10 feet activity   Assist     Assist level: Supervision/Verbal cueing Assistive device: Walker-rolling   Walk 50 feet activity   Assist    Assist level: Supervision/Verbal cueing Assistive device: Walker-rolling    Walk 150 feet activity   Assist    Assist level: Supervision/Verbal cueing Assistive device: Walker-rolling    Walk 10 feet on uneven surface  activity   Assist     Assist level: Minimal Assistance - Patient > 75% Assistive device: Geologist, engineering Will patient use wheelchair at discharge?: No  Wheelchair assist level: Independent      Wheelchair 50 feet with 2 turns activity    Assist            Wheelchair 150 feet activity     Assist          Medical Problem List and Plan: 1.  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  Continue CIR 2.  Antithrombotics: -DVT/anticoagulation:  Mechanical: Sequential compression devices, below knee Bilateral lower extremities  Lovenox -antiplatelet therapy: N/A 3. Pain Management: Mobic twice daily with hydrocodone as needed.             Monitor with increased exertion for post-operative and neuropathic pain  Duloxetine 60 mg daily on 11/25  Lyrica 75 mg BID for nerve pain  Controlled on 11/27 4. Mood: LCSW to follow for evaluation and support             -antipsychotic agents: N/A 5. Neuropsych: This patient is capable of making decisions on her own behalf. 6. Skin/Wound Care: Monitor wound for healing.  Routine pressure-relief measures.  Itching- con't benadryl and add eucerin BID 7. Fluids/Electrolytes/Nutrition: Monitor I/Os.               BMP within acceptable range on 11/22 8.  Fasting hyperglycemia: Likely due to prior steroids and/or IV dextrose--question impaired fasting glucose?               Blood glucose mildly elevated on 11/22 9.  Neurogenic bowel:   Has to learn dig stim/suppository- also reduced Senkot-S to Senna 1 tab daily AND added fiber for now.  Controlled 10. Neurogenic bladder:   Foley DC'd on 11/15  Increased flomax to 0.8 mg  Continue I/O caths, self cathing 11.  Acute blood loss anemia  Hemoccult positive, however hemoglobin improving  Hemoglobin 11.4 on 11/22  Continue to monitor  12. UTI  Ucx>100K ecoli, ESBL  11/19- ESBL in her urine, so will give Fosfomycin per pharmacy- and stop Keflex.   11/23- on contact precautions 13. Yeast infection: Resolved after treatment  LOS: 15 days A FACE TO FACE EVALUATION WAS PERFORMED  Melanie Baldwin 12/22/2019, 11:28 AM

## 2019-12-22 NOTE — Progress Notes (Addendum)
Pt with type 5 medium brown stool after bowel protocol . Pt is doing her own dig stim., but requires max assist for peri care.

## 2019-12-22 NOTE — Progress Notes (Signed)
Patient preformed dig stim @1830  with success of stimulating bowl with movement of a small BM type 5, there was blood in stool, but aware there was a positive hemoccult. Suppository was inserted. She is feeling much more comfortable with bowl program. Will report to oncoming staff.   , LPN

## 2019-12-23 ENCOUNTER — Inpatient Hospital Stay (HOSPITAL_COMMUNITY): Payer: Medicaid Other | Admitting: Occupational Therapy

## 2019-12-23 ENCOUNTER — Inpatient Hospital Stay (HOSPITAL_COMMUNITY): Payer: Medicaid Other | Admitting: Physical Therapy

## 2019-12-23 MED ORDER — SENNA 8.6 MG PO TABS
1.0000 | ORAL_TABLET | Freq: Every day | ORAL | Status: DC
  Administered 2019-12-24: 8.6 mg via ORAL
  Filled 2019-12-23: qty 1

## 2019-12-23 NOTE — Progress Notes (Deleted)
Spoke with patient this morning, we have been doing practice, and education regarding lovenox injections to help her feel more comfortable. Patient administered her lovenox injection this morning with much more confidence than I have seen her have over the last few days.   While patient was in OT this morning per therapist used the toilet with her first continent void! Will try to have patient void again at 1230.   Rayburn Ma, LPN

## 2019-12-23 NOTE — Progress Notes (Signed)
Spoke with pt this morning during morning medication pass to educate her more on the Lovenox injections we've have been working on ways for her to feel more comfortable to administer the Lovenox. This morning with a lot of encouragement she did admin her own Lovenox, she is feeling much more comfortable and confident regarding her Lovenox injections. Will continue to give education, and encouragement until discharge.   During OT session this morning @9am  patient had her first continent void!  After lunch @1230  I escorted patient to restroom where she had another continent void and very small BM in the toilet. She wants to continue to try to go to the bathroom in hopes she won't have to self cath one day. I will continue to monitor her voiding through out the day, will also bladder scan to check for any PVR.     We have had success for bowl programming as well, little to no incontinent episodes of BM and she is feeling very comfortable with dig stim. And suppository I will continue education on peri care, and work with her during the bowl program this evening and continue plan of care.   

## 2019-12-23 NOTE — Progress Notes (Signed)
Physical Therapy Session Note  Patient Details  Name: Melanie Baldwin MRN: [id number] Date of Birth: 1985-08-29  Today's Date: 12/23/2019 PT Individual Time: 1515-1610 PT Individual Time Calculation (min): 55 min   Short Term Goals: Week 2:  PT Short Term Goal 1 (Week 2): STGs = LTGs  Skilled Therapeutic Interventions/Progress Updates:    Pt received seated in bed, agreeable to PT session. No complaints of pain. Bed mobility independent. Sit to stand with RW throughout session at mod I level with use of RW. Ambulation up to 200 ft with use of RW at mod I level. Ascend/descend 12 x 6" stairs with 2 handrails at Supervision level, step-to and step-through gait pattern. Car transfer at mod I level with use of RW at simulation height of [employer] that pt will d/c home in. Ascend/descend ramp and ambulation across uneven surface with use of RW at Supervision level for safety. Provided pt with reacher and reacher walker bag, reviewed use of reacher to retrieve objects from the floor vs bending down to retrieve something. Pt able to retrieve objects from the floor with use of RW and reacher at mod I level, good safety awareness and safe RW management. Static standing balance performing soccer ball kick, 2 x 30 reps to fatigue with no UE support and close SBA for balance. Pt has occasional LOB posteriorly but is able to self-correct. Trial gait with no AD and min A progressing to CGA, 2 x 150 ft.  Pt exhibits occasional ataxia with gait with no AD but much improved from previous sessions. Encouraged pt to continue to utilize RW for safe and independent mobility upon d/c home and to progress to not using RW as safe and able. Provided pt with handout for HEP, reviewed and pt with no questions. Pt left seated in bed with needs in reach at end of session.  Therapy Documentation Precautions:  Precautions Precautions: Back, Fall Precaution Comments: per MD orders, no brace needed Restrictions Weight  Bearing Restrictions: No   Therapy/Group: Individual Therapy   Peter Congo, PT, DPT  12/23/2019, 4:49 PM

## 2019-12-23 NOTE — Progress Notes (Signed)
Bowl program done @1830  small type 5 BM during dig stim, pt inserted suppository. Will report to oncoming staff to continue bowl program with patient.

## 2019-12-23 NOTE — Progress Notes (Signed)
Pt had another medium sized brown stool type 5 after bowel protocol

## 2019-12-23 NOTE — Progress Notes (Signed)
[facility] PHYSICAL MEDICINE & REHABILITATION PROGRESS NOTE   Subjective/Complaints: Patient seen laying in bed this morning.  She states she slept well overnight.  She denies complaints.  She is looking forward to discharge tomorrow.  ROS: Denies CP, SOB, N/V/D  Objective:   No results found. No results for input(s): WBC, HGB, HCT, PLT in the last 72 hours. No results for input(s): NA, K, CL, CO2, GLUCOSE, BUN, CREATININE, CALCIUM in the last 72 hours.  Intake/Output Summary (Last 24 hours) at 12/23/2019 0920 Last data filed at 12/23/2019 0600 Gross per 24 hour  Intake --  Output 2642 ml  Net -2642 ml        Physical Exam: Vital Signs Blood pressure 115/76, pulse 76, temperature 98.7 F (37.1 C), temperature source Oral, resp. rate 16, height 5\' 4"  (1.626 m), weight 75.4 kg, SpO2 99 %, unknown if currently breastfeeding. Constitutional: No distress . Vital signs reviewed. HENT: Normocephalic.  Atraumatic. Eyes: EOMI. No discharge. Cardiovascular: No JVD.  RRR. Respiratory: Normal effort.  No stridor.  Bilateral clear to auscultation. GI: Non-distended.  BS +. Skin: Warm and dry.  Intact. Psych: Normal mood.  Normal behavior. Musc: No edema in extremities.  No tenderness in extremities. Neuro: Alert Motor: Bilateral upper extremities: 5/5 proximal to distal B/l LE: 5/5 proximal distal, unchanged Sensation diminished to light touch b/l LE, stable  Assessment/Plan: 1. Functional deficits which require 3+ hours per day of interdisciplinary therapy in a comprehensive inpatient rehab setting.  Physiatrist is providing close team supervision and 24 hour management of active medical problems listed below.  Physiatrist and rehab team continue to assess barriers to discharge/monitor patient progress toward functional and medical goals  Care Tool:  Bathing    Body parts bathed by patient: Right arm, Left arm, Chest, Abdomen, Front perineal area, Buttocks, Right upper leg,  Left upper leg, Right lower leg, Left lower leg, Face   Body parts bathed by helper: Front perineal area, Buttocks     Bathing assist Assist Level: Set up assist     Upper Body Dressing/Undressing Upper body dressing   What is the patient wearing?: Pull over shirt    Upper body assist Assist Level: Independent with assistive device    Lower Body Dressing/Undressing Lower body dressing      What is the patient wearing?: Incontinence brief, Pants     Lower body assist Assist for lower body dressing: Independent with assitive device     Toileting Toileting Toileting Activity did not occur (Clothing management and hygiene only): N/A (no void or bm)  Toileting assist Assist for toileting: Independent with assistive device     Transfers Chair/bed transfer  Transfers assist     Chair/bed transfer assist level: Supervision/Verbal cueing Chair/bed transfer assistive device:   Ambulation assist      Assist level: Supervision/Verbal cueing Assistive device: Walker-rolling Max distance: 175'   Walk 10 feet activity   Assist     Assist level: Supervision/Verbal cueing Assistive device: Walker-rolling   Walk 50 feet activity   Assist    Assist level: Supervision/Verbal cueing Assistive device: Walker-rolling    Walk 150 feet activity   Assist    Assist level: Supervision/Verbal cueing Assistive device: Walker-rolling    Walk 10 feet on uneven surface  activity   Assist     Assist level: Minimal Assistance - Patient > 75% Assistive device: Geologist, engineering Will patient use wheelchair at discharge?: No  Wheelchair assist level: Independent      Wheelchair 50 feet with 2 turns activity    Assist            Wheelchair 150 feet activity     Assist          Medical Problem List and Plan: 1.  Paraparesis,paresthesias, limitations in mobility and self-care  secondary to paraparesis due to cauda equina syndrome.  Continue CIR 2.  Antithrombotics: -DVT/anticoagulation:  Mechanical: Sequential compression devices, below knee Bilateral lower extremities  Lovenox -antiplatelet therapy: N/A 3. Pain Management: Mobic twice daily with hydrocodone as needed.             Monitor with increased exertion for post-operative and neuropathic pain  Duloxetine 60 mg daily on 11/25  Lyrica 75 mg BID for nerve pain  Controlled with meds on 11/28 4. Mood: LCSW to follow for evaluation and support             -antipsychotic agents: N/A 5. Neuropsych: This patient is capable of making decisions on her own behalf. 6. Skin/Wound Care: Monitor wound for healing.  Routine pressure-relief measures.  Itching- con't benadryl and add eucerin BID 7. Fluids/Electrolytes/Nutrition: Monitor I/Os.               BMP within acceptable range on 11/22 8.  Fasting hyperglycemia: Likely due to prior steroids and/or IV dextrose--question impaired fasting glucose?               Blood glucose mildly elevated on 11/22 9.  Neurogenic bowel:   Has learned dig stim/suppository, however requires max assist for pericare  Continues to have incontinent episodes, Senkot-S changed to senna only on 11/29 (already received a.m. dose) daily AND added fiber for now. 10. Neurogenic bladder:   Foley DC'd on 11/15  Increased flomax to 0.8 mg  Continue I/O caths, self cathing 11.  Acute blood loss anemia  Hemoccult positive, however hemoglobin improving  Hemoglobin 11.4 on 11/22  Continue to monitor  12. UTI  Ucx>100K ecoli, ESBL  11/19- ESBL in her urine, so will give Fosfomycin per pharmacy- and stop Keflex.   11/23- on contact precautions 13. Yeast infection: Resolved after treatment  LOS: 16 days A FACE TO FACE EVALUATION WAS PERFORMED  Melanie Baldwin 12/23/2019, 9:20 AM

## 2019-12-23 NOTE — Progress Notes (Signed)
Occupational Therapy Session Note  Patient Details  Name: Melanie Baldwin MRN: [id number] Date of Birth: 04-07-1985  Today's Date: 12/23/2019 OT Individual Time: 9024-0973 OT Individual Time Calculation (min): 59 min   Short Term Goals: Week 2:  OT Short Term Goal 1 (Week 2): STGs=LTGs due to ELOS  Skilled Therapeutic Interventions/Progress Updates:    Pt greeted in bed with no c/o pain, agreeable to engage in her self care routine. She completed toileting (using standard toilet, with her first continent bladder void!! We celebrated!), bathing (at shower level sit<stand), dressing (EOB sit<stand using RW), and hair styling with hair-dryer/oral care (sitting at sink) during session. All functional transfers completed at ambulatory level with Modified independence using RW, pt gathering needed clothing items and grooming items in room on her own. Setup for shower transfer/showering, Mod I otherwise. At end of session, OT placed a Mod I sign on her door and discussed her functional progress and goal achievement. Pt expressed feelings of pride. Pt remained in room at close of session.  Therapy Documentation Precautions:  Precautions Precautions: Back, Fall Precaution Comments: per MD orders, no brace needed Restrictions Weight Bearing Restrictions: No ADL: ADL Eating: Independent Grooming: Independent Where Assessed-Grooming: Sitting at sink Upper Body Bathing: Setup Where Assessed-Upper Body Bathing: Shower Lower Body Bathing: Supervision/safety Where Assessed-Lower Body Bathing: Shower Upper Body Dressing: Setup Where Assessed-Upper Body Dressing: Edge of bed Lower Body Dressing: Supervision/safety Where Assessed-Lower Body Dressing: Wheelchair, Sitting at sink, Standing at sink Toileting: Contact guard Where Assessed-Toileting: Teacher, adult education: Close supervision Toilet Transfer Method: Ambulating Psychologist, educational) Acupuncturist: Acupuncturist: Close  supervision Film/video editor Method: Designer, industrial/product: Shower seat with back      Therapy/Group: Individual Therapy  [name] [name] 12/23/2019, 12:39 PM

## 2019-12-23 NOTE — Progress Notes (Signed)
Physical Therapy Discharge Summary  Patient Details  Name: Melanie Baldwin MRN: [id number] Date of Birth: 10/12/1985  Today's Date: 12/23/2019  Patient has met 9 of 11 long term goals due to improved activity tolerance, improved balance, improved postural control, increased strength, decreased pain, ability to compensate for deficits and improved coordination.  Patient to discharge at an ambulatory level Modified Independent.   Patient's care partner is independent to provide the necessary physical assistance at discharge.  Reasons goals not met: Pt did not meet stair goal of mod I as she currently requires Supervision for safety on stairs. Pt does not have to navigate stairs at home therefore pt has met goal adequately for a safe d/c home. Pt did not meet w/c mobility goal as this was not a focus of her rehab stay as she will d/c home at an ambulatory level, goal no longer applicable.  Recommendation:  Patient will benefit from ongoing skilled PT services in outpatient setting to continue to advance safe functional mobility, address ongoing impairments in endurance, strength, safety, independence with functional mobility, and minimize fall risk.  Equipment: RW  Reasons for discharge: treatment goals met and discharge from hospital  Patient/family agrees with progress made and goals achieved: Yes  PT Discharge Precautions/Restrictions Precautions Precautions: Back;Fall Precaution Comments: per MD orders, no brace needed Restrictions Weight Bearing Restrictions: No Vision/Perception  Perception Perception: Within Functional Limits Praxis Praxis: Intact  Cognition Overall Cognitive Status: Within Functional Limits for tasks assessed Arousal/Alertness: Awake/alert Orientation Level: Oriented X4 Attention: Sustained Focused Attention: Appears intact Sustained Attention: Appears intact Memory: Appears intact Awareness: Appears intact Problem Solving: Appears  intact Safety/Judgment: Appears intact Sensation Sensation Light Touch: Impaired Detail Light Touch Impaired Details: Impaired LLE;Impaired RLE (impaired dorsum of feet and in saddle region) Proprioception: Appears Intact Coordination Gross Motor Movements are Fluid and Coordinated: Yes Fine Motor Movements are Fluid and Coordinated: Yes Motor  Motor Motor: Paraplegia;Abnormal postural alignment and control Motor - Discharge Observations: improved since eval  Mobility Bed Mobility Bed Mobility: Rolling Right;Rolling Left;Supine to Sit;Sit to Supine Rolling Right: Independent Rolling Left: Independent Left Sidelying to Sit: Independent Supine to Sit: Independent with assistive device Sitting - Scoot to Edge of Bed: Independent Sit to Supine: Independent Transfers Transfers: Sit to Stand;Stand to [employer] Transfers Sit to Stand: Independent with assistive device Stand to Sit: Independent with assistive device Stand Pivot Transfers: Independent with assistive device Transfer (Assistive device): Rolling walker Locomotion  Gait Ambulation: Yes Gait Assistance: Independent with assistive device Gait Distance (Feet): 200 Feet Assistive device: Rolling walker Gait Gait: Yes Gait Pattern: Within Functional Limits Gait Pattern: Decreased step length - right;Decreased step length - left;Decreased dorsiflexion - right;Narrow base of support;Step-through pattern Gait velocity: decreased Stairs / Additional Locomotion Stairs: Yes Stairs Assistance: Supervision/Verbal cueing Stair Management Technique: Two rails;Alternating pattern;Step to pattern Number of Stairs: 12 Height of Stairs: 6 Ramp: Supervision/Verbal cueing Wheelchair Mobility Wheelchair Mobility: No  Trunk/Postural Assessment  Cervical Assessment Cervical Assessment: Within Functional Limits Thoracic Assessment Thoracic Assessment: Within Functional Limits Lumbar Assessment Lumbar Assessment: Within  Functional Limits Postural Control Postural Control: Within Functional Limits  Balance Balance Balance Assessed: Yes Static Sitting Balance Static Sitting - Balance Support: Feet supported;No upper extremity supported Static Sitting - Level of Assistance: 7: Independent Dynamic Sitting Balance Dynamic Sitting - Balance Support: No upper extremity supported;Feet supported;During functional activity Dynamic Sitting - Level of Assistance: 7: Independent Static Standing Balance Static Standing - Balance Support: Bilateral upper extremity supported;During functional activity Static Standing -  Level of Assistance: 6: Modified independent (Device/Increase time) Dynamic Standing Balance Dynamic Standing - Balance Support: No upper extremity supported;During functional activity Dynamic Standing - Level of Assistance: 5: Stand by assistance;6: Modified independent (Device/Increase time) Extremity Assessment   RLE Assessment RLE Assessment: Within Functional Limits General Strength Comments: 5/5 grossly, 4+/5 knee flexion LLE Assessment LLE Assessment: Within Functional Limits General Strength Comments: 5/5 grossly, 4+/5 knee flexion     Excell Seltzer, PT, DPT 12/23/2019, 4:53 PM

## 2019-12-23 NOTE — Plan of Care (Signed)

## 2019-12-24 DIAGNOSIS — K649 Unspecified hemorrhoids: Secondary | ICD-10-CM

## 2019-12-24 LAB — BASIC METABOLIC PANEL
Anion gap: 10 (ref 5–15)
BUN: 11 mg/dL (ref 6–20)
CO2: 27 mmol/L (ref 22–32)
Calcium: 9 mg/dL (ref 8.9–10.3)
Chloride: 101 mmol/L (ref 98–111)
Creatinine, Ser: 0.6 mg/dL (ref 0.44–1.00)
GFR, Estimated: >60 mL/min (ref >60)
Glucose, Bld: 111 mg/dL — ABNORMAL HIGH (ref 70–99)
Potassium: 4.1 mmol/L (ref 3.5–5.1)
Sodium: 138 mmol/L (ref 135–145)

## 2019-12-24 LAB — CBC
HCT: 38 % (ref 36.0–46.0)
Hemoglobin: 11.7 g/dL — ABNORMAL LOW (ref 12.0–15.0)
MCH: 27.3 pg (ref 26.0–34.0)
MCHC: 30.8 g/dL (ref 30.0–36.0)
MCV: 88.6 fL (ref 80.0–100.0)
Platelets: 174 10*3/uL (ref 150–400)
RBC: 4.29 MIL/uL (ref 3.87–5.11)
RDW: 15.3 % (ref 11.5–15.5)
WBC: 7.2 10*3/uL (ref 4.0–10.5)
nRBC: 0 % (ref 0.0–0.2)

## 2019-12-24 MED ORDER — HYDROCORTISONE (PERIANAL) 2.5 % EX CREA: 1.0000 "application " | TOPICAL_CREAM | Freq: Two times a day (BID) | CUTANEOUS | 0 refills | Status: AC | PRN

## 2019-12-24 MED ORDER — PREGABALIN 75 MG PO CAPS: 75.0000 mg | ORAL_CAPSULE | Freq: Two times a day (BID) | ORAL | 0 refills | Status: DC

## 2019-12-24 MED ORDER — MELOXICAM 15 MG PO TABS: 15.0000 mg | ORAL_TABLET | Freq: Every day | ORAL | 0 refills | Status: DC

## 2019-12-24 MED ORDER — DULOXETINE HCL 60 MG PO CPEP: 60.0000 mg | ORAL_CAPSULE | Freq: Every day | ORAL | 0 refills | Status: DC

## 2019-12-24 MED ORDER — PANTOPRAZOLE SODIUM 40 MG PO TBEC: 40.0000 mg | DELAYED_RELEASE_TABLET | Freq: Every day | ORAL | 0 refills | Status: DC

## 2019-12-24 MED ORDER — ENOXAPARIN SODIUM 40 MG/0.4ML ~~LOC~~ SOLN: 40.0000 mg | Freq: Every day | SUBCUTANEOUS | 0 refills | Status: DC

## 2019-12-24 MED ORDER — BISACODYL 10 MG RE SUPP: 10.0000 mg | Freq: Every day | RECTAL | 2 refills | Status: AC

## 2019-12-24 MED ORDER — METHOCARBAMOL 500 MG PO TABS: 500.0000 mg | ORAL_TABLET | Freq: Four times a day (QID) | ORAL | 0 refills | Status: AC | PRN

## 2019-12-24 MED ORDER — TAMSULOSIN HCL 0.4 MG PO CAPS: 0.8000 mg | ORAL_CAPSULE | Freq: Every day | ORAL | 0 refills | Status: DC

## 2019-12-24 MED ORDER — MELATONIN 3 MG PO CAPS: 3.0000 mg | ORAL_CAPSULE | Freq: Every evening | ORAL | 0 refills | Status: AC | PRN

## 2019-12-24 MED ORDER — HYDROCERIN EX CREA: 1.0000 "application " | TOPICAL_CREAM | Freq: Two times a day (BID) | CUTANEOUS | 0 refills | Status: AC

## 2019-12-24 MED ORDER — PSYLLIUM 95 % PO PACK: 1.0000 | PACK | Freq: Every day | ORAL | 0 refills | Status: AC

## 2019-12-24 MED ORDER — GERHARDT'S BUTT CREAM: 1.0000 "application " | TOPICAL_CREAM | Freq: Four times a day (QID) | CUTANEOUS | Status: AC

## 2019-12-24 MED ORDER — SENNA 8.6 MG PO TABS: 1.0000 | ORAL_TABLET | Freq: Every day | ORAL | 0 refills | Status: AC

## 2019-12-24 NOTE — Discharge Summary (Signed)
Physician Discharge Summary  Patient ID: Melanie Baldwin MRN: [id number] DOB/AGE: [birth year] 34 y.o.  Admit date: 12/07/2019 Discharge date: 12/24/2019  Discharge Diagnoses:  Principal Problem:   Cauda equina spinal cord injury [facility]) Active Problems:   Neurogenic bladder   Neurogenic bowel   Acute blood loss anemia   Constipation by delayed colonic transit   Neuropathic pain   Hemorrhoids   Discharged Condition: stable   Significant Diagnostic Studies: DG Abd 1 View  Result Date: 12/14/2019 CLINICAL DATA:  Constipation.  Delayed colonic transit. EXAM: ABDOMEN - 1 VIEW COMPARISON:  December 12, 2019 FINDINGS: Moderate fecal loading remains, particularly in the ascending colon. The overall amount of fecal load/burden has markedly decreased in the interval. No bowel obstruction. Phleboliths in the right pelvis. IMPRESSION: Marked improvement in previously identified fecal loading. Moderate fecal loading remains in the ascending colon. Electronically Signed   By: Gerome Sam III M.D   On: 12/14/2019 16:13   DG Abd Portable 1V  Result Date: 12/12/2019 CLINICAL DATA:  Constipation EXAM: PORTABLE ABDOMEN - 1 VIEW COMPARISON:  12/07/2019 FINDINGS: Large stool burden throughout the colon. There is a non obstructive bowel gas pattern. No supine evidence of free air. No organomegaly or suspicious calcification. No acute bony abnormality. IMPRESSION: Large stool burden compatible with constipation. Electronically Signed   By: Charlett Nose M.D.   On: 12/12/2019 18:56   DG Abd Portable 1V  Result Date: 12/07/2019 CLINICAL DATA:  Constipation EXAM: PORTABLE ABDOMEN - 1 VIEW COMPARISON:  None. FINDINGS: Upper normal amount of stool in the colon may correlate with the patient's observe constipation. No dilated small bowel. No significant abnormal calcifications. Mild dextroconvex lumbar scoliosis may be positional. IMPRESSION: 1. Upper normal amount of stool in the colon may correlate with the  patient's observe constipation. Electronically Signed   By: Gaylyn Rong M.D.   On: 12/07/2019 19:00    Labs:  Basic Metabolic Panel: BMP Latest Ref Rng & Units 12/24/2019 12/17/2019 12/10/2019  Glucose 70 - 99 mg/dL 893(T) 342(A) 768(T)  BUN 6 - 20 mg/dL 11 13 17   Creatinine 0.44 - 1.00 mg/dL 1.57 2.62  Sodium 135 - 145 mmol/L 138 143 137  Potassium 3.5 - 5.1 mmol/L 4.1 4.7 4.3  Chloride 98 - 111 mmol/L 101 105 105  CO2 22 - 32 mmol/L 27 30 22   Calcium 8.9 - 10.3 mg/dL 9.0 9.2 8.9    CBC: CBC Latest Ref Rng & Units 12/24/2019 12/17/2019 12/14/2019  WBC 4.0 - 10.5 K/uL 7.2 7.4 9.9  Hemoglobin 12.0 - 15.0 g/dL 11.7(L) 11.4(L) 10.8(L)  Hematocrit 36 - 46 % 38.0 37.2 34.6(L)  Platelets 150 - 400 K/uL 174 190 188    CBG: No results for input(s): GLUCAP in the last 168 hours.  Brief HPI:   Melanie Baldwin is a 34 y.o. female with history of low back pain Worsening during pregnancy progressive symptoms with pain radiating down BLE with multiple ED visits.  She was treated for radiculopathy but continued to worsen take back down to describe admission when awaiting clearance for MRI of spine.  She was admitted on 12/04/2019 with low back pain, urinary incontinence, lack of rectal tone with saddle anesthesia, lower extremity numbness and inability to stand.  MRI lumbar spine showed large extruded disc fragment in midline at L4-L5 with compression of thecal sac and severe spinal stenosis.  She was taken to the OR on 11/09 for bilateral L4-L5 decompression with resection of ruptured disc.  She continued to have  limitations due to BLE weakness with paresthesias affecting ADLs and mobility.  CIR was recommended due to functional decline.   Hospital Course: Melanie Baldwin was admitted to rehab 12/07/2019 for inpatient therapies to consist of PT, ST and OT at least three hours five days a week. Past admission physiatrist, therapy team and rehab RN have worked together to provide customized  collaborative inpatient rehab.  She was started on Lovenox for DVT prophylaxis on 11/22 and recommendations are to continue Lovenox for 2 total months. Follow-up CBC shows H&H to be slowly improving and leukocytosis has resolved.  Check of electrolytes shows renal status WNL.  Her blood pressures were monitored on TID basis and has been well controlled.  Back incision is C/D/analytical healing well.  Back pain is managed with use of Mobic and as needed use of Tylenol..  She has had significant neuropathic pain which has improved with use of Cymbalta as well as addition of Lyrica.    Hospital course was significant for severe obstipation requiring aggressive bowel program.  She did have a flareup of hemorrhoids with rectal bleeding which was treated with Anusol suppositories as well as Anusol cream.  She has had good results with intervention and current bowel program includes senna as well as fiber in a.m., tolerating past meals as well as suppository after supper.  Flomax was added and increased to 0.8 mg at bedtime.  She has been educated on in and out caths with 45 times a day to keep bladder volumes below 300 cc.  She was noted to have vaginal drainage felt to be due to Candida vaginitis and was treated with a course of Diflucan.  She was also started on Keflex due to concerns of UTIs however urine culture showed ESBL E. coli which was treated with a dose of fosfomycin.  Anxiety levels have improved and she has made good gains during her rehab stay.  She is currently modified independent in supervised setting   Rehab course: During patient's stay in rehab weekly team conferences were held to monitor patient's progress, set goals and discuss barriers to discharge. At admission, patient required min assist with mobility and with ADL tasks. She  has had improvement in activity tolerance, balance, postural control as well as ability to compensate for deficits. She is able to complete ADL tasks at modified  independent level.  She is modified independent for transfers and is able to ambulate 200 feet with rolling walker.  She requires supervision to navigate 12 stairs.  Family education has been completed with husband regarding all aspects of safety and care.    Discharge disposition: 01-Home or Self Care  Diet: Regular  Special Instructions: 1.  No bending, twisting, arching or lifting items over 5 pounds. 2.  To continue bowel program and I/o caths per current scheduled.    Discharge Instructions    Ambulatory referral to Physical Medicine Rehab   Complete by: As directed    1-2 weeks TC appointment   Ambulatory referral to Psychology   Complete by: As directed    Needs hospital follow up     Allergies as of 12/24/2019   No Known Allergies     Medication List    STOP taking these medications   traMADol 50 MG tablet Commonly known as: ULTRAM     TAKE these medications   acetaminophen 325 MG tablet Commonly known as: TYLENOL Take 1-2 tablets (325-650 mg total) by mouth every 4 (four) hours as needed for mild pain.  bisacodyl 10 MG suppository Commonly known as: DULCOLAX Place 1 suppository (10 mg total) rectally daily after supper.   DULoxetine 60 MG capsule Commonly known as: CYMBALTA Take 1 capsule (60 mg total) by mouth daily. Start taking on: December 25, 2019   enoxaparin 40 MG/0.4ML injection Commonly known as: LOVENOX Inject 0.4 mLs (40 mg total) into the skin daily. Start taking on: December 25, 2019   Gerhardt's butt cream Crea Apply 1 application topically 4 (four) times daily. Notes to patient: When this is over--buy A & D ointment or Desitin for use as needed.    hydrocerin Crea Apply 1 application topically 2 (two) times daily. Notes to patient: For dry skin--same as Eucerin   hydrocortisone 2.5 % rectal cream Commonly known as: ANUSOL-HC Place 1 application rectally 2 (two) times daily as needed for hemorrhoids or anal itching. For hemorrhoidal  irritation or any rectal bleeding Notes to patient: Can change to as needed if hemorrhoids are no longer bothering you   Melatonin 3 MG Caps Take 1 capsule (3 mg total) by mouth at bedtime as needed. This is the medication I was taking about--natural medication to help sleep better at nights. Is over the counter.   meloxicam 15 MG tablet Commonly known as: MOBIC Take 1 tablet (15 mg total) by mouth daily. Notes to patient: Take with food   methocarbamol 500 MG tablet Commonly known as: ROBAXIN Take 1 tablet (500 mg total) by mouth every 6 (six) hours as needed for muscle spasms. What changed: when to take this   pantoprazole 40 MG tablet Commonly known as: PROTONIX Take 1 tablet (40 mg total) by mouth at bedtime.   pregabalin 75 MG capsule Commonly known as: LYRICA Take 1 capsule (75 mg total) by mouth 2 (two) times daily.   psyllium 95 % Pack Commonly known as: HYDROCIL/METAMUCIL Take 1 packet by mouth daily. This comes in a big bottle--use one scoop daily or follow instructions on the bottle. Start taking on: December 25, 2019   senna 8.6 MG Tabs tablet Commonly known as: SENOKOT Take 1 tablet (8.6 mg total) by mouth daily. With breakfast Start taking on: December 25, 2019   tamsulosin 0.4 MG Caps capsule Commonly known as: FLOMAX Take 2 capsules (0.8 mg total) by mouth daily after supper.       Follow-up Information    Lovorn, Aundra Millet, MD Follow up.   Specialty: Physical Medicine and Rehabilitation Why: Office will call you with follow up appointment Contact information: [address] [place] [address] [phone]        Maeola Harman, MD. Call on 12/25/2019.   Specialty: Neurosurgery Why: to get an appointment for post op check.  Contact information: [address] [place] [address] [phone]        Alfredo Martinez, MD Follow up on 01/22/2020.   Specialty: Urology Why: Be there at 12:45 pm. (for follow up on  bladder issues) Contact information: [address] [place] [address] [phone]        Hershal Coria, PsyD. Call.   Specialty: Psychology Why: for follow up Contact information: [address] [place] [address] [phone]               Signed: Jacquelynn Cree 12/24/2019, 5:46 PM

## 2019-12-24 NOTE — Discharge Instructions (Signed)
Inpatient Rehab Discharge Instructions  Oriel Ojo Discharge date and time:  12/24/19  Activities/Precautions/ Functional Status: Activity: no lifting, driving, or strenuous exercise till cleared by MD Diet: regular diet Wound Care: keep wound clean and dry    Functional status:  ___ No restrictions     ___ Walk up steps independently ___ 24/7 supervision/assistance   ___ Walk up steps with assistance _X__ Intermittent supervision/assistance  ___ Bathe/dress independently ___ Walk with walker     ___ Bathe/dress with assistance ___ Walk Independently    ___ Shower independently ___ Walk with assistance    _X__ Shower with assistance _X__ No alcohol     ___ Return to work/school ________   Special Instructions: 1. Cath every 4-6 hours to keep volumes less than 300 cc. 2. Monitor foods you eat--certain foods that cause gas will also cause you to be incontinent of bowel.  Sit on toilet about 30-45 minutes after eating to prevent bowel accidents. 3. Need to purchase these medications over the counter--can use store brand: Dulcolax suppository, Eucerin cream, Metamucil or Fiber con (also comes in a pill) and Sennokot. Do not use creamy or scented soaps on private area.    COMMUNITY REFERRALS UPON DISCHARGE:   HOME EXERCISE PROGRAM DUE TO NO HOME HEALTH AGENCY WILL TAKE HER MEDICAID  Medical Equipment/Items Ordered:ROLLING WALKER, 3 IN 1, TUB BENCH                                                 Agency/Supplier:[facility]  [phone] [employer]-[id number]   My questions have been answered and I understand these instructions. I will adhere to these goals and the provided educational materials after my discharge from the hospital.  Patient/Caregiver Signature _______________________________ Date __________  Clinician Signature _______________________________________ Date __________  Please bring this form and your medication list with you to all your follow-up doctor's  appointments.

## 2019-12-24 NOTE — Plan of Care (Signed)
  Problem: Consults Goal: RH SPINAL CORD INJURY PATIENT EDUCATION Description:  See Patient Education module for education specifics.  Outcome: Completed/Met   Problem: SCI BOWEL ELIMINATION Goal: RH STG MANAGE BOWEL WITH ASSISTANCE Description: STG Manage Bowel with min Assistance. Outcome: Completed/Met   Problem: SCI BLADDER ELIMINATION Goal: RH STG MANAGE BLADDER WITH ASSISTANCE Description: STG Manage Bladder With min Assistance Outcome: Completed/Met   Problem: RH SKIN INTEGRITY Goal: RH STG MAINTAIN SKIN INTEGRITY WITH ASSISTANCE Description: STG Maintain Skin Integrity With min Assistance. Outcome: Completed/Met   Problem: RH SAFETY Goal: RH STG ADHERE TO SAFETY PRECAUTIONS W/ASSISTANCE/DEVICE Description: STG Adhere to Safety Precautions With min Assistance/Device. Outcome: Completed/Met   Problem: RH PAIN MANAGEMENT Goal: RH STG PAIN MANAGED AT OR BELOW PT'S PAIN GOAL Description: <3 on a 0-10 pain scale Outcome: Completed/Met   Problem: RH KNOWLEDGE DEFICIT SCI Goal: RH STG INCREASE KNOWLEDGE OF SELF CARE AFTER SCI Description: Patient will demonstrate knowledge of medication management, weight bearing precautions, skin care management, and follow up care with the MD post discharge with min assist from CIR staff. Outcome: Completed/Met

## 2019-12-24 NOTE — Progress Notes (Signed)
Pt discharged home with family. D/d instruction given by Elita Quick, PA. Pt belongings and equipment sent home with pt. No further questions from pt. Pt self administered lovenox injection prior to dc. Pt denies any pain or discomfort at time of discharge.   Marylu Lund, RN

## 2019-12-24 NOTE — Progress Notes (Signed)
Inpatient Rehabilitation Care Coordinator  Discharge Note  The overall goal for the admission was met for:   Discharge location: Yes. D/c to home with husband.   Length of Stay: Yes. 16 days.   Discharge activity level: Yes. Mod I; sup with stairs  Home/community participation: Yes. Limited.   Services provided included: MD, RD, PT, OT, RN, CM, TR, Pharmacy, Neuropsych and SW  Financial Services: Private Insurance: [place] [employer]  Follow-up services arranged: DME: [place] for 3in1 BSC, TTB, RW; [employer] for catheters and Other: Home exercise program due to inability to obtain [facility] due to insurance  Comments (or additional information): contact pt [phone]  Patient/Family verbalized understanding of follow-up arrangements: Yes  Individual responsible for coordination of the follow-up plan: Pt to have assistance at discharge.   Confirmed correct DME delivered: Rana Snare 12/24/2019    Rana Snare

## 2019-12-26 ENCOUNTER — Telehealth: Payer: Self-pay | Admitting: *Deleted

## 2019-12-26 NOTE — Telephone Encounter (Signed)
Contacted patient at her home Confirmed appointment Confirmed address Advised packet sent by mail with paperwork regarding visit  Transitional Care Questions   1. Are you/is patient experiencing any problems since coming home?  No Are there any questions regarding any aspect of care? No   2. Are there any questions regarding medications administration/dosing? No  Are meds being taken as prescribed?  Yes Patient should review meds with caller to confirm   3. Have there been any falls?  No  4. Has Home Health been to the house and/or have they contacted you? Due to insurance Home Exercise program is the plan for now. If not, have you tried to contact them? Can we help you contact them?   5. Are bowels and bladder emptying properly? Yes  Are there any unexpected incontinence issues? No If applicable, is patient following bowel/bladder programs?   6. Any fevers, problems with breathing, unexpected pain?  No  7. Are there any skin problems or new areas of breakdown?  No  8. Has the patient/family member arranged specialty MD follow up (ie cardiology/neurology/renal/surgical/etc)? Yes  Can we help arrange?   9. Does the patient need any other services or support that we can help arrange?  No  10. Are caregivers following through as expected in assisting the patient?  Yes  11. Has the patient quit smoking, drinking alcohol, or using drugs as recommended? Patient states that she has not and does not do any of these

## 2020-01-02 ENCOUNTER — Other Ambulatory Visit: Payer: Self-pay

## 2020-01-02 ENCOUNTER — Encounter: Payer: Medicaid Other | Attending: Registered Nurse | Admitting: Registered Nurse

## 2020-01-02 VITALS — BP 132/84 | HR 99 | Temp 98.2°F | Ht 64.0 in | Wt 173.6 lb

## 2020-01-02 DIAGNOSIS — N3000 Acute cystitis without hematuria: Secondary | ICD-10-CM | POA: Diagnosis not present

## 2020-01-02 DIAGNOSIS — N319 Neuromuscular dysfunction of bladder, unspecified: Secondary | ICD-10-CM | POA: Insufficient documentation

## 2020-01-02 DIAGNOSIS — G834 Cauda equina syndrome: Secondary | ICD-10-CM | POA: Diagnosis not present

## 2020-01-02 DIAGNOSIS — K592 Neurogenic bowel, not elsewhere classified: Secondary | ICD-10-CM | POA: Diagnosis not present

## 2020-01-02 DIAGNOSIS — K5901 Slow transit constipation: Secondary | ICD-10-CM | POA: Diagnosis present

## 2020-01-02 DIAGNOSIS — G8918 Other acute postprocedural pain: Secondary | ICD-10-CM | POA: Insufficient documentation

## 2020-01-02 DIAGNOSIS — M792 Neuralgia and neuritis, unspecified: Secondary | ICD-10-CM | POA: Diagnosis present

## 2020-01-02 MED ORDER — TRAMADOL HCL 50 MG PO TABS
50.0000 mg | ORAL_TABLET | Freq: Two times a day (BID) | ORAL | 1 refills | Status: DC | PRN
Start: 2020-01-02 — End: 2020-02-28

## 2020-01-02 NOTE — Progress Notes (Signed)
Subjective:    Patient ID: Melanie Baldwin, female    DOB: Aug 20, 1985, 34 y.o.   MRN: [id number]  HPI: Melanie Baldwin is a 34 y.o. female who is here for transitional care visit of her Cauda equina spinal cord injury, Neurogenic Bladder, Neurogenic Bowel, Neuropathic Pain, Constipation by Delayed Colonic Transit and Acute Cystitis without Hematuria. Melanie Baldwin presented to [facility] on 12/04/2019 for progressively worsening low back pain, bilateral lower extremities weakness, decreased sensation and urinary incontinence and urinary retention. Neurosurgery was consulted.  MR Lumbar Spine WO Contrast:  : L5 is partially incorporated into the sacrum  Large extruded disc fragment in the midline at L4-5 with compression of thecal sac and severe spinal stenosis.  Melanie Baldwin underwent : see below: by Dr Venetia Maxon on 12/04/2019. Bilateral Lumbar Four-Five Microdiscectomy Bilateral   Melanie Baldwin was admitted to inpatient rehabilitation on 12/07/2019 and discharged home on 12/24/2019. She states she has pain in her lower back and bilateral lower extremities. Also reports no relief of her lower back pain, the above was discussed with Dr. Berline Chough, she agrees with Tramadol order. She rates her pain 5.    She also reports burning sensation with in and out catheterization. Urine culture was ordered. She is performing in and out catheterization 250- 300 cc and also states  Occasionally has  600 cc of urine. She was encourage to continue with In and Out Caths  4-5 times a day to keep bladder volumes below 300 cc, she verbalizes understanding.  Husband in room all questions answered.    Pain Inventory Average Pain 5 Pain Right Now 5 My pain is burning  LOCATION OF PAIN  Thigh, leg, ankle  BOWEL Number of stools per week: 7 Oral laxative use No  Type of laxative na Enema or suppository use Yes  History of colostomy No  Incontinent No   BLADDER Foley and Suprapubic In and out cath,  frequency 4-6 Able to self cath Yes  Bladder incontinence No  Frequent urination No  Leakage with coughing No  Difficulty starting stream No  Incomplete bladder emptying No    Mobility walk with assistance use a walker ability to climb steps?  no do you drive?  no  Function I need assistance with the following:  bathing, household duties and shopping  Neuro/Psych bladder control problems bowel control problems weakness numbness tingling trouble walking  Prior Studies TC appt  Physicians involved in your care TC appt   Family History  Problem Relation Age of Onset  . Diabetes Paternal Grandmother    Social History   Socioeconomic History  . Marital status: Married    Spouse name: Not on file  . Number of children: Not on file  . Years of education: Not on file  . Highest education level: Not on file  Occupational History  . Not on file  Tobacco Use  . Smoking status: Never Smoker  . Smokeless tobacco: Never Used  Vaping Use  . Vaping Use: Never used  Substance and Sexual Activity  . Alcohol use: No  . Drug use: No  . Sexual activity: Yes  Other Topics Concern  . Not on file  Social History Narrative  . Not on file   Social Determinants of Health   Financial Resource Strain:   . Difficulty of Paying Living Expenses: Not on file  Food Insecurity:   . Worried About Programme researcher, broadcasting/film/video in the Last Year: Not on file  . Ran Out of Food  in the Last Year: Not on file  Transportation Needs:   . Lack of Transportation (Medical): Not on file  . Lack of Transportation (Non-Medical): Not on file  Physical Activity:   . Days of Exercise per Week: Not on file  . Minutes of Exercise per Session: Not on file  Stress:   . Feeling of Stress : Not on file  Social Connections:   . Frequency of Communication with Friends and Family: Not on file  . Frequency of Social Gatherings with Friends and Family: Not on file  . Attends Religious Services: Not on file  .  Active Member of Clubs or Organizations: Not on file  . Attends Banker Meetings: Not on file  . Marital Status: Not on file   Past Surgical History:  Procedure Laterality Date  . LUMBAR LAMINECTOMY/DECOMPRESSION MICRODISCECTOMY Bilateral 12/04/2019   Procedure: Bilateral Lumbar Four-Five Microdiscectomy;  Surgeon: Maeola Harman, MD;  Location: [facility] OR;  Service: Neurosurgery;  Laterality: Bilateral;   Past Medical History:  Diagnosis Date  . Cystic fibrosis carrier   . Normal pregnancy 09/24/2010  . TB (pulmonary tuberculosis)    took med for 6 months currently negative   BP 132/84   Pulse 99   Temp 98.2 F (36.8 C)   Ht 5\' 4"  (1.626 m)   Wt 173 lb 9.6 oz (78.7 kg)   SpO2 93%   BMI 29.80 kg/m   Opioid Risk Score:   Fall Risk Score:  `1  Depression screen PHQ 2/9  Depression screen PHQ 2/9 01/02/2020  Decreased Interest 0  Down, Depressed, Hopeless 0  PHQ - 2 Score 0    Review of Systems  Musculoskeletal:       Leg  Neurological: Positive for weakness and numbness.  All other systems reviewed and are negative.      Objective:   Physical Exam Vitals and nursing note reviewed.  Constitutional:      Appearance: Normal appearance.  Cardiovascular:     Rate and Rhythm: Normal rate and regular rhythm.     Pulses: Normal pulses.     Heart sounds: Normal heart sounds.  Pulmonary:     Effort: Pulmonary effort is normal.     Breath sounds: Normal breath sounds.  Musculoskeletal:     Cervical back: Normal range of motion and neck supple.     Comments: Normal Muscle Bulk and Muscle Testing Reveals:  Upper Extremities: Full ROM and Muscle Strength 5/5 Lumbar Paraspinal Tenderness: L-4-L-5 Lower Extremities: Full ROM and Muscle Strength 5/5 Arises from Table with ease Narrow Based  Gait   Skin:    General: Skin is warm and dry.  Neurological:     Mental Status: She is alert and oriented to person, place, and time.  Psychiatric:        Mood and Affect:  Mood normal.        Behavior: Behavior normal.           Assessment & Plan:  1. Cauda equina spinal cord injury: Continue HEP as Tolerated. Per 14/08/2019 LCSW unable to obtain Home Health due to her insurance. Continue to monitor.  2.  Neurogenic Bladder: Continue In and Out Cath's 4-5 times a day. To keep blader volumes below 300 cc, she verbalizes understanding.  3. Neurogenic Bowel: Continue bowel Program. Continue to monitor.  4.  Neuropathic Pain: Continue current medication regimen. Continue to monitor.  5. Constipation by Delayed Colonic Transi: Continue current medication regimen. Continue to monitor.  5.  Acute Cystitis without Hematuria: RX: UA Culture. Continue to Monitor.  5. Post-Operative Pain: RX: Tramadol 50 mg one tablet twice a day as needed for pain #60. 6. DVT Prophylaxis: Continue Lovenox for 2 months. Continue to monitor.   F/U with Dr Berline Chough in 4-6 weeks.

## 2020-01-03 ENCOUNTER — Telehealth: Payer: Self-pay | Admitting: General Practice

## 2020-01-03 NOTE — Telephone Encounter (Signed)
I was contacting by facility trying to schedule new pt appt for pt. Pt has [employer], can we schedule anymore with this insurance or no?

## 2020-01-04 ENCOUNTER — Other Ambulatory Visit: Payer: Self-pay | Admitting: Registered Nurse

## 2020-01-04 MED ORDER — ENOXAPARIN SODIUM 40 MG/0.4ML ~~LOC~~ SOLN: 40.0000 mg | Freq: Every day | SUBCUTANEOUS | 0 refills | Status: AC

## 2020-01-04 NOTE — Telephone Encounter (Signed)
I called pt and put her on wait list for Dr Veto Kemps and said we will call once we can schedule for him but left appt scheduled with Ramon Dredge at [facility] for now until we can get pt on the schedule

## 2020-01-04 NOTE — Telephone Encounter (Signed)
Right now we are at capacity with medicaid and managed medicaid patients. We cannot accept until we have new provider in office.

## 2020-01-07 ENCOUNTER — Encounter: Payer: Self-pay | Admitting: Registered Nurse

## 2020-01-07 ENCOUNTER — Encounter: Payer: Self-pay | Admitting: *Deleted

## 2020-01-11 ENCOUNTER — Telehealth: Payer: Self-pay | Admitting: Registered Nurse

## 2020-01-11 NOTE — Telephone Encounter (Signed)
Sybil RN [employer]: Awaiting on Final Urine  [employer]. The representative stated  " the lab is backed up and it is in progress, as the result states.

## 2020-01-12 LAB — URINE CULTURE

## 2020-01-12 LAB — URINALYSIS, ROUTINE W REFLEX MICROSCOPIC
Bilirubin, UA: NEGATIVE
Glucose, UA: NEGATIVE
Ketones, UA: NEGATIVE
Nitrite, UA: POSITIVE — AB
Protein,UA: NEGATIVE
RBC, UA: NEGATIVE
Specific Gravity, UA: 1.014 (ref 1.005–1.030)
Urobilinogen, Ur: 0.2 mg/dL (ref 0.2–1.0)
pH, UA: 6.5 (ref 5.0–7.5)

## 2020-01-12 LAB — MICROSCOPIC EXAMINATION
Casts: NONE SEEN /lpf
Epithelial Cells (non renal): NONE SEEN /hpf (ref 0–10)
RBC, Urine: NONE SEEN /hpf (ref 0–2)

## 2020-01-15 ENCOUNTER — Telehealth: Payer: Self-pay | Admitting: Registered Nurse

## 2020-01-15 MED ORDER — CIPROFLOXACIN HCL 250 MG PO TABS: 250.0000 mg | ORAL_TABLET | Freq: Two times a day (BID) | ORAL | 0 refills | Status: AC

## 2020-01-15 NOTE — Telephone Encounter (Signed)
Received Final Culture Results: Placed a call to Inpatient Pharmacy and spoke with pharmacist.  We will prescribe Cipro 250 mg BID x 7days. Placed a call to Melanie Baldwin regarding the above, she verbalizes understanding.

## 2020-01-20 ENCOUNTER — Other Ambulatory Visit: Payer: Self-pay | Admitting: Physical Medicine and Rehabilitation

## 2020-01-23 ENCOUNTER — Other Ambulatory Visit: Payer: Self-pay

## 2020-01-25 ENCOUNTER — Other Ambulatory Visit (INDEPENDENT_AMBULATORY_CARE_PROVIDER_SITE_OTHER): Payer: Self-pay

## 2020-01-29 ENCOUNTER — Other Ambulatory Visit: Payer: Self-pay | Admitting: *Deleted

## 2020-01-29 MED ORDER — PREGABALIN 75 MG PO CAPS: 75.0000 mg | ORAL_CAPSULE | Freq: Two times a day (BID) | ORAL | 0 refills | Status: DC

## 2020-02-15 ENCOUNTER — Encounter: Payer: Medicaid Other | Attending: Registered Nurse | Admitting: Physical Medicine and Rehabilitation

## 2020-02-15 ENCOUNTER — Encounter: Payer: Self-pay | Admitting: Physical Medicine and Rehabilitation

## 2020-02-15 ENCOUNTER — Other Ambulatory Visit: Payer: Self-pay

## 2020-02-15 VITALS — BP 129/84 | HR 85 | Temp 97.8°F | Ht 64.0 in | Wt 175.0 lb

## 2020-02-15 DIAGNOSIS — M792 Neuralgia and neuritis, unspecified: Secondary | ICD-10-CM | POA: Insufficient documentation

## 2020-02-15 DIAGNOSIS — N319 Neuromuscular dysfunction of bladder, unspecified: Secondary | ICD-10-CM | POA: Diagnosis present

## 2020-02-15 DIAGNOSIS — K5901 Slow transit constipation: Secondary | ICD-10-CM | POA: Diagnosis present

## 2020-02-15 DIAGNOSIS — K592 Neurogenic bowel, not elsewhere classified: Secondary | ICD-10-CM | POA: Insufficient documentation

## 2020-02-15 DIAGNOSIS — G834 Cauda equina syndrome: Secondary | ICD-10-CM | POA: Insufficient documentation

## 2020-02-15 MED ORDER — PREGABALIN 150 MG PO CAPS: 150.0000 mg | ORAL_CAPSULE | Freq: Two times a day (BID) | ORAL | 5 refills | Status: DC

## 2020-02-15 MED ORDER — LIDOCAINE 5 % EX PTCH: 2.0000 | MEDICATED_PATCH | CUTANEOUS | 5 refills | Status: DC

## 2020-02-15 NOTE — Progress Notes (Signed)
Subjective:    Patient ID: Melanie Baldwin, female    DOB: 1985/08/22, 35 y.o.   MRN: [id number]  HPI  Pt is a 35 yr old female with hx of incomplete cauda equina syndrome with paraparesis, paraesthesias, and neurogenic bowel and bladder here for hospital f/u.   Doing his exercises, but past few days, having really bad pain.   Bottom of feet- pinching her- "ouch"- not doing as as well due to pain.  Harder to move around for awhile due to back pain- tramadol worked pretty well so helped just not completely.  Pain came suddenly- Still has foot pain, but no more back pain.   Been 3 days since back pain went away.   Tramadol makes her sleepy, dizzy and tired- and constipated.  Wasn't quite as bad this time  Never used Norco after d/c from hospital- so don't know how she reacts to it.   Taking both Lyrica/pregabalin BID and Duloxetine in AM.     Is also constipated.      Is pushing hard to pee-  Only way she can void- still cathing- 6x/day- q4 hours- drinking well.   Bowel program- going well-  No accidents since got home.   No more sensation in privates.  Still zero.   Walking well- no assistive device.  Walking really well- husband walks with her- and holds her hand.   Had a muscle cramps in calves- B/L Denies an muscle spasms where sees the legs jump.     Pain Inventory Average Pain 5 Pain Right Now 5 My pain is sharp  In the last 24 hours, has pain interfered with the following? General activity 0 Relation with others 0 Enjoyment of life 0 What TIME of day is your pain at its worst? night Sleep (in general) Good  Pain is worse with: walking Pain improves with: medication Relief from Meds: 7  Family History  Problem Relation Age of Onset   Diabetes Paternal Grandmother    Social History   Socioeconomic History   Marital status: Married    Spouse name: Not on file   Number of children: Not on file   Years of education: Not on file    Highest education level: Not on file  Occupational History   Not on file  Tobacco Use   Smoking status: Never Smoker   Smokeless tobacco: Never Used  Vaping Use   Vaping Use: Never used  Substance and Sexual Activity   Alcohol use: No   Drug use: No   Sexual activity: Yes  Other Topics Concern   Not on file  Social History Narrative   Not on file   Social Determinants of Health   Financial Resource Strain: Not on file  Food Insecurity: Not on file  Transportation Needs: Not on file  Physical Activity: Not on file  Stress: Not on file  Social Connections: Not on file   Past Surgical History:  Procedure Laterality Date   LUMBAR LAMINECTOMY/DECOMPRESSION MICRODISCECTOMY Bilateral 12/04/2019   Procedure: Bilateral Lumbar Four-Five Microdiscectomy;  Surgeon: Maeola Harman, MD;  Location: [facility] OR;  Service: Neurosurgery;  Laterality: Bilateral;   Past Surgical History:  Procedure Laterality Date   LUMBAR LAMINECTOMY/DECOMPRESSION MICRODISCECTOMY Bilateral 12/04/2019   Procedure: Bilateral Lumbar Four-Five Microdiscectomy;  Surgeon: Maeola Harman, MD;  Location: [facility] OR;  Service: Neurosurgery;  Laterality: Bilateral;   Past Medical History:  Diagnosis Date   Cystic fibrosis carrier    Normal pregnancy 09/24/2010   TB (pulmonary tuberculosis)  took med for 6 months currently negative   BP 129/84    Pulse 85    Temp 97.8 F (36.6 C)    Ht 5\' 4"  (1.626 m)    Wt 175 lb (79.4 kg)    SpO2 98%    BMI 30.04 kg/m   Opioid Risk Score:   Fall Risk Score:  `1  Depression screen PHQ 2/9  Depression screen PHQ 2/9 01/02/2020  Decreased Interest 0  Down, Depressed, Hopeless 0  PHQ - 2 Score 0    Review of Systems  Musculoskeletal: Positive for back pain.       Foot pain  All other systems reviewed and are negative.      Objective:   Physical Exam  Awake, alert, appropriate, accompanied by husband, no assistive device, NAD  MS: LEs- HF 5/5, KE 5/5, DF 5/5 and  PF 4/5 B/L  Neuro: Sensation decreased to light touch from L4- S2- and absent from S3-S5.  B/L       Assessment & Plan:    Pt is a 35 yr old female with hx of incomplete cauda equina syndrome with paraparesis, paraesthesias, and neurogenic bowel and bladder here for hospital f/u.    1. Explained that nerve pain can get worse for up to 6 months then stabilizes out-  Will work on controlling nerve pain.   2. Increase Lyrica to 150 mg 2x/day- for nerve pain  3. On Duloxetine 60 mg daily- will continue this dose.   4.  Went over risk of DVT/clots- now same as general population- so keep moving on long car rides and plane rides, etc.  Risk isn't Zero, but same as husband.   5.  Has tramadol Rx- - will con't as needed.  6. Will try and get Lidocaine patches - from insurance- due to localized nerve pain on feet- is ideal candidate- 2 patches 12 hrs on; l12 hrs off-  No more than 3 patches at a time.   7.  Short muscle cramps- drink Orange juice and bananas- if that doesn't work, try Magnesium replacement- 200 mg 1-2x/day as needed- don't take regularly- can cause looser stools.   8. Can walk on heels and toes for a few steps every day a few times per day for strengthening.     9.  Happy to write for pt for disability- pt has cauda equina syndrome-  However if moves to [facility].  Will see if can find her a doctor in [place], [place].   10. F/U in 6 weeks if can before you leave.   I spent a total of 40 minutes on visit- as detailed above.

## 2020-02-15 NOTE — Patient Instructions (Addendum)
Pt is a 35 yr old female with hx of incomplete cauda equina syndrome with paraparesis, paraesthesias, and neurogenic bowel and bladder here for hospital f/u.    Exam: MS: LEs- HF 5/5, KE 5/5, DF 5/5 and PF 4/5 B/L  Neuro: Sensation decreased to light touch from L4- S2- and absent from S3-S5.  B/L    1. Explained that nerve pain can get worse for up to 6 months then stabilizes out-  Will work on controlling nerve pain.   2. Increase Lyrica to 150 mg 2x/day- for nerve pain  3. On Duloxetine 60 mg daily- will continue this dose.   4.  Went over risk of DVT/clots- now same as general population- so keep moving on long car rides and plane rides, etc.  Risk isn't Zero, but same as husband.   5.  Has tramadol Rx- - will con't as needed.  6. Will try and get Lidocaine patches - from insurance- due to localized nerve pain on feet- is ideal candidate- 2 patches 12 hrs on; l12 hrs off-  No more than 3 patches at a time.   7.  Short muscle cramps- drink Orange juice and bananas- if that doesn't work, try Magnesium replacement- 200 mg 1-2x/day as needed- don't take regularly- can cause looser stools.   8. Can walk on heels and toes for a few steps every day a few times per day for strengthening.     9.  Happy to write for pt for disability- pt has cauda equina syndrome-  However if moves to [facility].  Will see if can find her a doctor in [place], [place].   10. F/U in 6 weeks if can before you leave.

## 2020-02-19 ENCOUNTER — Other Ambulatory Visit: Payer: Self-pay | Admitting: Physical Medicine and Rehabilitation

## 2020-02-22 ENCOUNTER — Other Ambulatory Visit: Payer: Self-pay

## 2020-02-22 MED ORDER — DULOXETINE HCL 60 MG PO CPEP: 60.0000 mg | ORAL_CAPSULE | Freq: Every day | ORAL | 0 refills | Status: DC

## 2020-02-28 ENCOUNTER — Telehealth: Payer: Self-pay | Admitting: Physical Medicine and Rehabilitation

## 2020-02-28 MED ORDER — MELOXICAM 15 MG PO TABS: ORAL_TABLET | ORAL | 1 refills | Status: AC

## 2020-02-28 MED ORDER — LIDOCAINE 5 % EX PTCH
2.0000 | MEDICATED_PATCH | CUTANEOUS | 1 refills | Status: AC
Start: 2020-02-28 — End: ?

## 2020-02-28 MED ORDER — PREGABALIN 150 MG PO CAPS: 150.0000 mg | ORAL_CAPSULE | Freq: Two times a day (BID) | ORAL | 1 refills | Status: AC

## 2020-02-28 MED ORDER — TRAMADOL HCL 50 MG PO TABS: 50.0000 mg | ORAL_TABLET | Freq: Two times a day (BID) | ORAL | 5 refills | Status: AC | PRN

## 2020-02-28 MED ORDER — DULOXETINE HCL 60 MG PO CPEP: 60.0000 mg | ORAL_CAPSULE | Freq: Every day | ORAL | 1 refills | Status: AC

## 2020-02-28 NOTE — Telephone Encounter (Signed)
Renewed tramadol with 5 refills Lyrica 90 day supply with 1 refill 150 mg BID Mobic 15 mg daily 90 supply 1 refill No flomax since wasn't working Protonix 40 mg daily 90 days supply 1 refills Lidoderm 90 day supply- 1 refill Duloxetine 60 mg daily 90 day supply- 1 refill  Cannot write for 90 day supply for tramadol due to being controlled substance- I THINK they will take 90 day supply for the other meds- Will try- if not, let me know- they should be able to at least give her a 1 month supply.  Let pt know.

## 2020-02-28 NOTE — Telephone Encounter (Signed)
Patient notified

## 2020-02-28 NOTE — Telephone Encounter (Signed)
Patient would like to speak to Dr. Berline Chough about her medication.  She is moving tomorrow and will be needing her prescription filled.  Please call patient when this is done.  Thank you.

## 2020-03-11 NOTE — Telephone Encounter (Signed)
I called pharmacy- they said they would fill them- SO since you've already gotten Tramadol and Pregabalin refilled, you just need the others filled- those are the ones I called in for you- thanks, ML

## 2020-03-14 ENCOUNTER — Ambulatory Visit: Payer: Medicaid Other | Admitting: Medical

## 2020-03-17 ENCOUNTER — Encounter: Payer: Medicaid Other | Attending: Registered Nurse | Admitting: Physical Medicine and Rehabilitation

## 2020-03-17 DIAGNOSIS — G894 Chronic pain syndrome: Secondary | ICD-10-CM | POA: Insufficient documentation

## 2020-03-17 DIAGNOSIS — Z79899 Other long term (current) drug therapy: Secondary | ICD-10-CM | POA: Insufficient documentation

## 2020-03-17 DIAGNOSIS — Z5181 Encounter for therapeutic drug level monitoring: Secondary | ICD-10-CM | POA: Insufficient documentation

## 2020-04-11 DIAGNOSIS — G834 Cauda equina syndrome: Secondary | ICD-10-CM

## 2020-04-11 NOTE — Telephone Encounter (Signed)
Pt called- she is unable to get into Dr Lennie Hummer office in [place] without a referral- will try and send an external referral to her office, since [place]- It's not clear if they want a referral from local PCP or not, but pt DOES need to be seen- soon, if at all possible.  Cannot refill her controlled meds due to crossing state lines.  She was on Lyrica and Tramadol.  Please try and get her in- fax number is [phone]- per google, her office number is [phone]. Thank you

## 2020-06-02 ENCOUNTER — Ambulatory Visit: Payer: Medicaid Other | Admitting: Psychology

## 2022-03-31 IMAGING — CR DG LUMBAR SPINE 2-3V
2 series · 2 of 2 positions shown · non-contrast
Comparison: Radiograph 11/21/2019, MRI 12/04/2019

CLINICAL DATA: L4-L5 micro discectomy

EXAM:
LUMBAR SPINE - 2-3 VIEW

[lateral (1 of 2)]
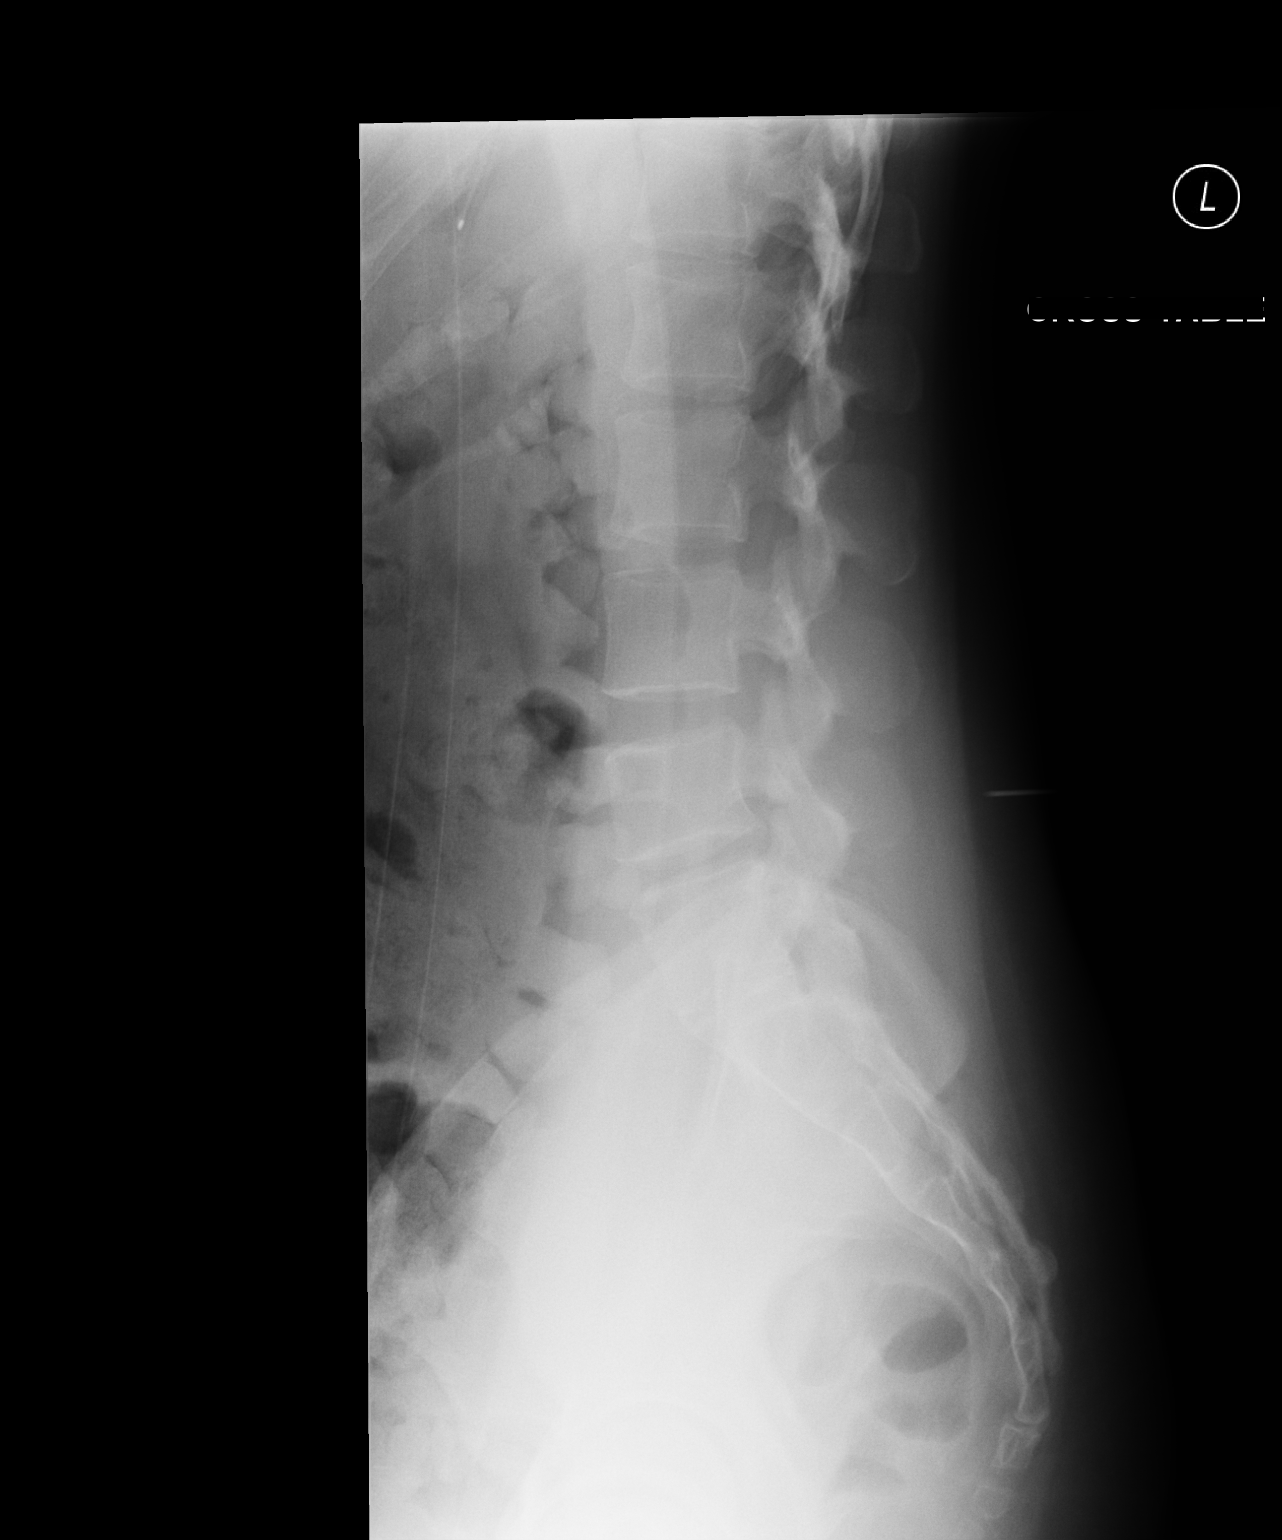

[lateral (2 of 2)]
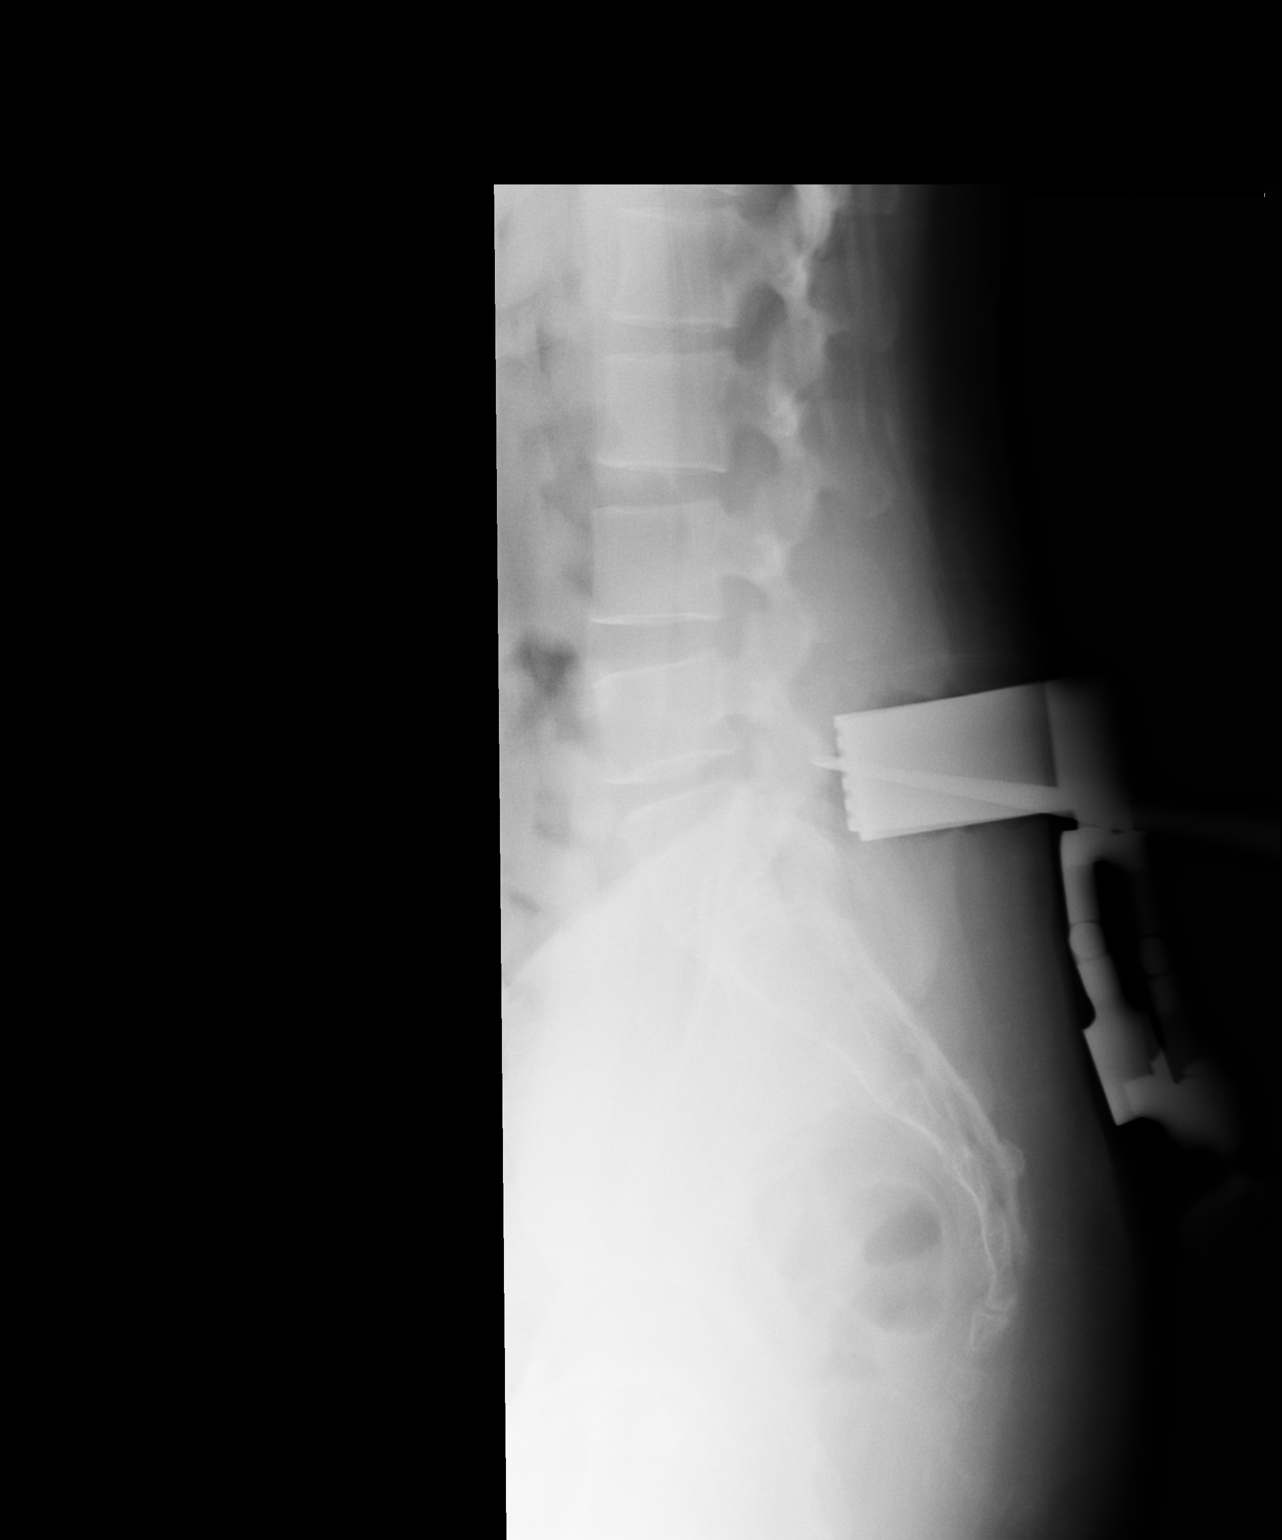

[2 of 2 positions shown; findings below may reference images not displayed]

FINDINGS: Pointed surgical implement is directed towards the L4-5 facet with
retractor posterior to the disc space as well. Redemonstration of a
partially sacralized L5 vertebral body. No acute osseous
abnormalities. Expected surgical soft tissue changes are seen.
IMPRESSION: Localization of the L4-5 facet.

## 2022-04-03 IMAGING — DX DG ABD PORTABLE 1V
1 series · 1 of 1 positions shown · non-contrast
Comparison: None.

CLINICAL DATA: Constipation

EXAM:
PORTABLE ABDOMEN - 1 VIEW

[abdomen kub]
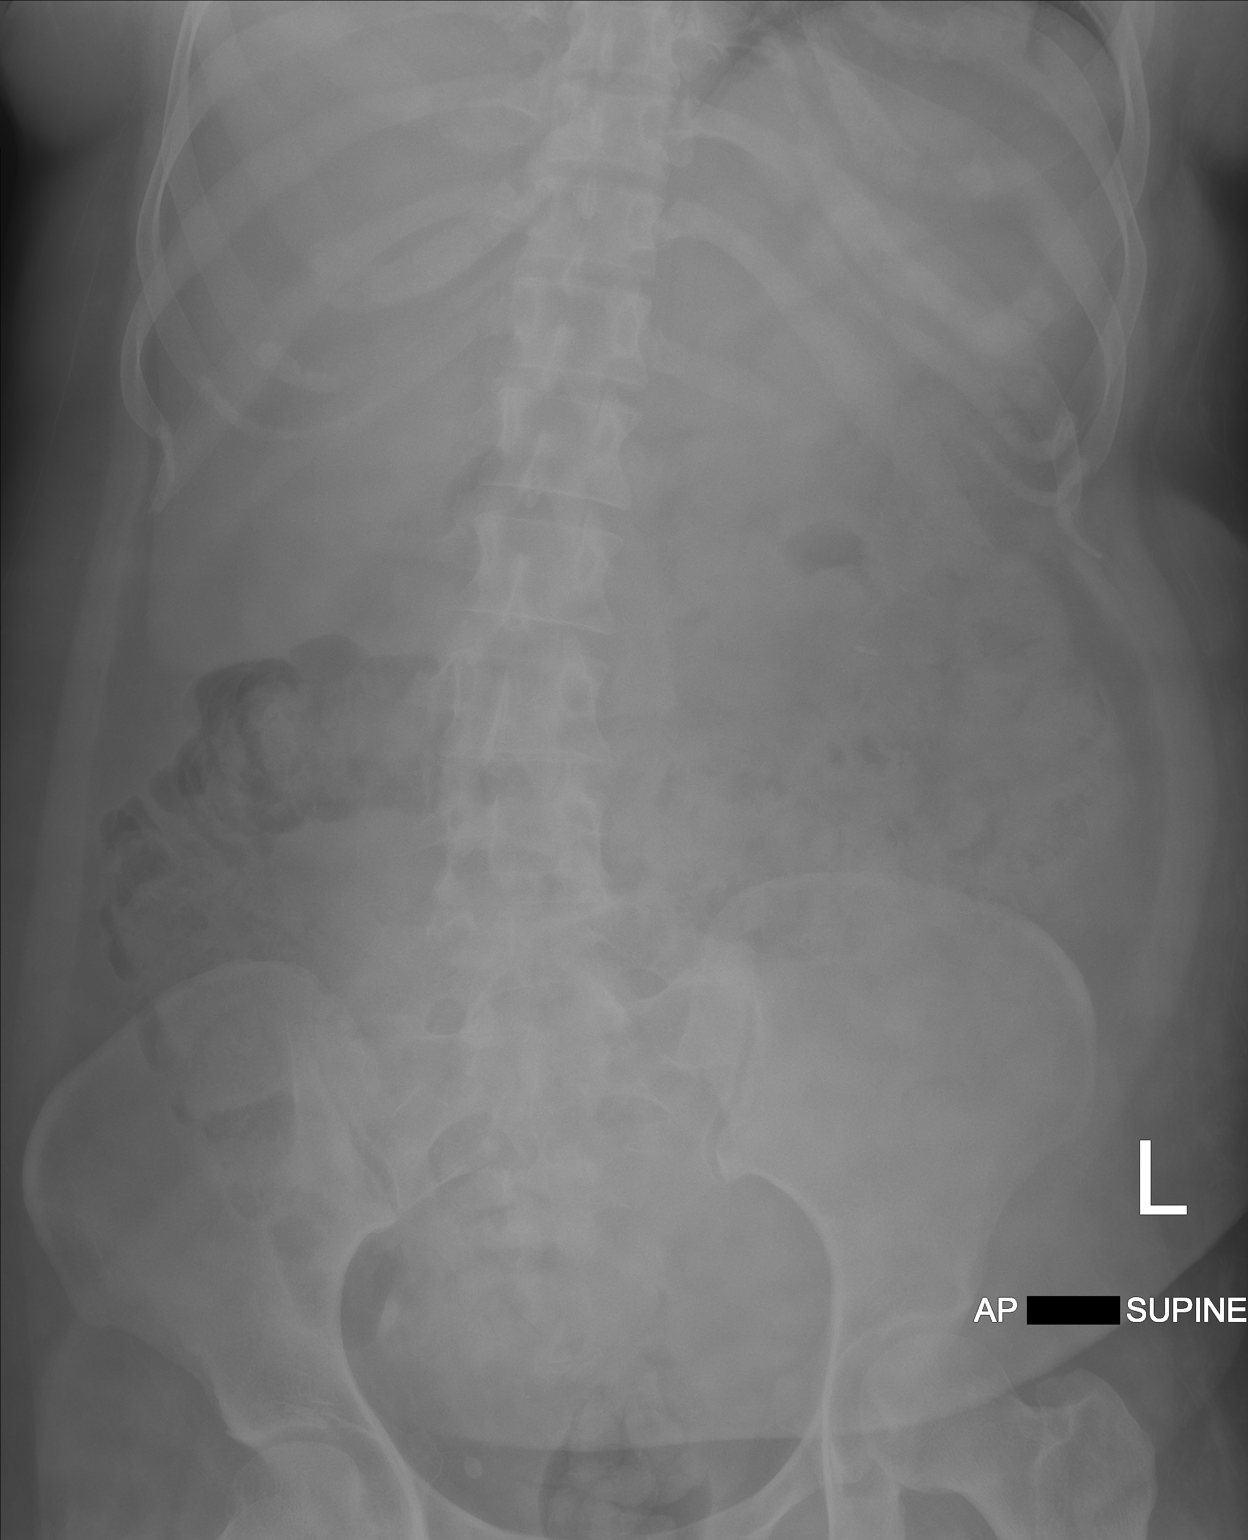

[1 of 1 positions shown; findings below may reference images not displayed]

FINDINGS: Upper normal amount of stool in the colon may correlate with the
patient's observe constipation. No dilated small bowel. No
significant abnormal calcifications.

Mild dextroconvex lumbar scoliosis may be positional.
IMPRESSION: 1. Upper normal amount of stool in the colon may correlate with the
patient's observe constipation.

## 2022-05-10 ENCOUNTER — Encounter: Payer: Self-pay | Admitting: *Deleted
